# Patient Record
Sex: Female | Born: 1945 | Race: White | Hispanic: No | Marital: Married | Smoking: Never smoker
Health system: Southern US, Community
[De-identification: ages and names within clinical notes are randomized; demographics above are authoritative.]

## PROBLEM LIST (undated history)

## (undated) DIAGNOSIS — R269 Unspecified abnormalities of gait and mobility: Secondary | ICD-10-CM

## (undated) DIAGNOSIS — I471 Supraventricular tachycardia, unspecified: Secondary | ICD-10-CM

## (undated) DIAGNOSIS — A809 Acute poliomyelitis, unspecified: Secondary | ICD-10-CM

## (undated) DIAGNOSIS — E0590 Thyrotoxicosis, unspecified without thyrotoxic crisis or storm: Secondary | ICD-10-CM

## (undated) HISTORY — DX: Supraventricular tachycardia: I47.1

## (undated) HISTORY — PX: TONSILLECTOMY: SUR1361

## (undated) HISTORY — PX: BREAST SURGERY: SHX581

## (undated) HISTORY — DX: Supraventricular tachycardia, unspecified: I47.10

## (undated) HISTORY — DX: Unspecified abnormalities of gait and mobility: R26.9

---

## 1997-12-20 HISTORY — PX: BREAST BIOPSY: SHX20

## 2005-09-13 ENCOUNTER — Ambulatory Visit: Payer: Self-pay

## 2006-09-26 ENCOUNTER — Ambulatory Visit: Payer: Self-pay

## 2007-01-13 ENCOUNTER — Ambulatory Visit: Payer: Self-pay | Admitting: Unknown Physician Specialty

## 2007-09-29 ENCOUNTER — Ambulatory Visit: Payer: Self-pay

## 2007-10-28 ENCOUNTER — Emergency Department: Payer: Self-pay | Admitting: Emergency Medicine

## 2008-10-11 ENCOUNTER — Ambulatory Visit: Payer: Self-pay

## 2009-12-10 ENCOUNTER — Ambulatory Visit: Payer: Self-pay

## 2009-12-17 ENCOUNTER — Ambulatory Visit: Payer: Self-pay

## 2010-06-05 ENCOUNTER — Ambulatory Visit: Payer: Self-pay

## 2010-12-15 ENCOUNTER — Ambulatory Visit: Payer: Self-pay

## 2010-12-20 HISTORY — PX: BREAST BIOPSY: SHX20

## 2010-12-31 ENCOUNTER — Ambulatory Visit: Payer: Self-pay | Admitting: Surgery

## 2011-01-08 ENCOUNTER — Ambulatory Visit: Payer: Self-pay | Admitting: Surgery

## 2011-01-12 LAB — PATHOLOGY REPORT

## 2013-05-01 ENCOUNTER — Ambulatory Visit: Payer: Self-pay | Admitting: Internal Medicine

## 2015-07-04 ENCOUNTER — Ambulatory Visit
Admission: RE | Admit: 2015-07-04 | Discharge: 2015-07-04 | Disposition: A | Discharge status: Home / Self Care | Payer: Medicare Other | Source: Ambulatory Visit | Attending: Unknown Physician Specialty | Admitting: Unknown Physician Specialty

## 2015-07-04 ENCOUNTER — Ambulatory Visit: Payer: Medicare Other | Admitting: Anesthesiology

## 2015-07-04 ENCOUNTER — Encounter
Admission: RE | Disposition: A | Discharge status: Home / Self Care | Payer: Self-pay | Source: Ambulatory Visit | Attending: Unknown Physician Specialty

## 2015-07-04 ENCOUNTER — Encounter: Payer: Self-pay | Admitting: Anesthesiology

## 2015-07-04 DIAGNOSIS — D125 Benign neoplasm of sigmoid colon: Secondary | ICD-10-CM | POA: Insufficient documentation

## 2015-07-04 DIAGNOSIS — Z8371 Family history of colonic polyps: Secondary | ICD-10-CM | POA: Diagnosis not present

## 2015-07-04 DIAGNOSIS — K5730 Diverticulosis of large intestine without perforation or abscess without bleeding: Secondary | ICD-10-CM | POA: Diagnosis not present

## 2015-07-04 DIAGNOSIS — K640 First degree hemorrhoids: Secondary | ICD-10-CM | POA: Insufficient documentation

## 2015-07-04 DIAGNOSIS — Z8612 Personal history of poliomyelitis: Secondary | ICD-10-CM | POA: Insufficient documentation

## 2015-07-04 DIAGNOSIS — E0590 Thyrotoxicosis, unspecified without thyrotoxic crisis or storm: Secondary | ICD-10-CM | POA: Insufficient documentation

## 2015-07-04 DIAGNOSIS — Z79899 Other long term (current) drug therapy: Secondary | ICD-10-CM | POA: Diagnosis not present

## 2015-07-04 DIAGNOSIS — Z1211 Encounter for screening for malignant neoplasm of colon: Secondary | ICD-10-CM | POA: Insufficient documentation

## 2015-07-04 HISTORY — PX: COLONOSCOPY WITH PROPOFOL: SHX5780

## 2015-07-04 HISTORY — DX: Thyrotoxicosis, unspecified without thyrotoxic crisis or storm: E05.90

## 2015-07-04 HISTORY — DX: Acute poliomyelitis, unspecified: A80.9

## 2015-07-04 SURGERY — COLONOSCOPY WITH PROPOFOL
Anesthesia: General

## 2015-07-04 MED ORDER — SODIUM CHLORIDE 0.9 % IV SOLN
INTRAVENOUS | Status: DC
  Administered 2015-07-04: 12:00:00 via INTRAVENOUS
  Administered 2015-07-04: 1000 mL via INTRAVENOUS

## 2015-07-04 MED ORDER — PROPOFOL 10 MG/ML IV BOLUS
INTRAVENOUS | Status: DC | PRN
  Administered 2015-07-04: 70 mg via INTRAVENOUS

## 2015-07-04 MED ORDER — FENTANYL CITRATE (PF) 100 MCG/2ML IJ SOLN
INTRAMUSCULAR | Status: DC | PRN
  Administered 2015-07-04: 50 ug via INTRAVENOUS

## 2015-07-04 MED ORDER — MIDAZOLAM HCL 5 MG/5ML IJ SOLN
INTRAMUSCULAR | Status: DC | PRN
  Administered 2015-07-04: 2 mg via INTRAVENOUS

## 2015-07-04 MED ORDER — SODIUM CHLORIDE 0.9 % IV SOLN: INTRAVENOUS | Status: DC

## 2015-07-04 NOTE — Op Note (Signed)
[facility] Gastroenterology Patient Name: Priscilla Berry Procedure Date: 07/04/2015 11:50 AM MRN: [id number] Account #: [id number] Date of Birth: 23-Feb-1946 Admit Type: Outpatient Age: 69 Room: [facility] ENDO ROOM 1 Gender: Female Note Status: Finalized Procedure:         Colonoscopy Indications:       Colon cancer screening in patient at increased risk:                     Family history of colon polyps Providers:         Manya Silvas, MD Referring MD:      Kimberlee Nearing. Alvin Critchley, MD (Referring MD) Medicines:         Propofol per Anesthesia Complications:     No immediate complications. Procedure:         Pre-Anesthesia Assessment:                    - After reviewing the risks and benefits, the patient was                     deemed in satisfactory condition to undergo the procedure.                    After obtaining informed consent, the colonoscope was                     passed under direct vision. Throughout the procedure, the                     patient's blood pressure, pulse, and oxygen saturations                     were monitored continuously. The Colonoscope was                     introduced through the anus and advanced to the the cecum,                     identified by appendiceal orifice and ileocecal valve. The                     colonoscopy was somewhat difficult due to a tortuous                     colon. Successful completion of the procedure was aided by                     applying abdominal pressure. The patient tolerated the                     procedure well. The quality of the bowel preparation was                     good. Findings:      Five sessile polyps were found in the sigmoid colon. The polyps were       diminutive in size. These polyps were removed with a jumbo cold forceps.       Resection and retrieval were complete.      A few small-mouthed diverticula were found in the sigmoid colon and in       the descending colon.   Internal hemorrhoids were found during endoscopy. The hemorrhoids were       small and Grade I (internal hemorrhoids that do not  prolapse).      The exam was otherwise without abnormality. Impression:        - Five diminutive polyps in the sigmoid colon. Resected                     and retrieved.                    - Diverticulosis in the sigmoid colon and in the                     descending colon.                    - Internal hemorrhoids.                    - The examination was otherwise normal. Recommendation:    - Await pathology results. Manya Silvas, MD 07/04/2015 12:40:26 PM This report has been signed electronically. Number of Addenda: 0 Note Initiated On: 07/04/2015 11:50 AM Scope Withdrawal Time: 0 hours 15 minutes 29 seconds  Total Procedure Duration: 0 hours 27 minutes 22 seconds       [facility]

## 2015-07-04 NOTE — Anesthesia Procedure Notes (Signed)
Date/Time: 07/04/2015 12:10 PM Performed by: Allean Found Pre-anesthesia Checklist: Patient identified, Emergency Drugs available, Suction available, Patient being monitored and Timeout performed Oxygen Delivery Method: Nasal cannula

## 2015-07-04 NOTE — Anesthesia Preprocedure Evaluation (Addendum)
Anesthesia Evaluation  Patient identified by MRN, date of birth, ID band Patient awake    Reviewed: Allergy & Precautions, NPO status , Patient's Chart, lab work & pertinent test results, reviewed documented beta blocker date and time   Airway Mallampati: II  TM Distance: >3 FB     Dental  (+) Chipped   Pulmonary          Cardiovascular     Neuro/Psych    GI/Hepatic   Endo/Other  Hypothyroidism   Renal/GU      Musculoskeletal   Abdominal   Peds  Hematology   Anesthesia Other Findings   Reproductive/Obstetrics                             Anesthesia Physical Anesthesia Plan  ASA: II  Anesthesia Plan: General   Post-op Pain Management:    Induction: Intravenous  Airway Management Planned: Nasal Cannula  Additional Equipment:   Intra-op Plan:   Post-operative Plan:   Informed Consent: I have reviewed the patients History and Physical, chart, labs and discussed the procedure including the risks, benefits and alternatives for the proposed anesthesia with the patient or authorized representative who has indicated his/her understanding and acceptance.     Plan Discussed with: CRNA  Anesthesia Plan Comments:         Anesthesia Quick Evaluation

## 2015-07-04 NOTE — Anesthesia Postprocedure Evaluation (Signed)
  Anesthesia Post-op Note  Patient: Priscilla Berry  Procedure(s) Performed: Procedure(s): COLONOSCOPY WITH PROPOFOL (N/A)  Anesthesia type:General  Patient location: PACU  Post pain: Pain level controlled  Post assessment: Post-op Vital signs reviewed, Patient's Cardiovascular Status Stable, Respiratory Function Stable, Patent Airway and No signs of Nausea or vomiting  Post vital signs: Reviewed and stable  Last Vitals:  Filed Vitals:   07/04/15 1610  BP: 136/71  Pulse:   Temp:   Resp:     Level of consciousness: awake, alert  and patient cooperative  Complications: No apparent anesthesia complications

## 2015-07-04 NOTE — H&P (Signed)
Primary Care Physician:  Idelle Crouch, MD Primary Gastroenterologist:  Dr. Vira Agar  Pre-Procedure History & Physical: HPI:  Priscilla Berry is a 69 y.o. female is here for an colonoscopy.   Past Medical History  Diagnosis Date  . Hyperthyroidism   . Polio     Past Surgical History  Procedure Laterality Date  . Breast surgery      breast biopsies  . Tonsillectomy      Prior to Admission medications   Medication Sig Start Date End Date Taking? Authorizing Provider  Calcium Carbonate-Vitamin D (CALCIUM-VITAMIN D) 500-200 MG-UNIT per tablet Take 1 tablet by mouth 3 (three) times a week.   Yes Historical Provider, MD  Fish Oil-Cholecalciferol (FISH OIL + D3) 1000-1000 MG-UNIT CAPS Take by mouth 1 day or 1 dose.   Yes Historical Provider, MD  folic acid (FOLVITE) 1 MG tablet Take 1 mg by mouth daily.   Yes Historical Provider, MD  iodine-sodium iodide 2-2.4 % solution Apply topically 3 (three) times a week.   Yes Historical Provider, MD  magnesium oxide (MAG-OX) 400 MG tablet Take 400 mg by mouth daily.   Yes Historical Provider, MD  progesterone (PROMETRIUM) 100 MG capsule Take 100 mg by mouth daily.   Yes Historical Provider, MD  pyridoxine (B-6) 100 MG tablet Take 100 mg by mouth daily.   Yes Historical Provider, MD  thyroid (ARMOUR) 65 MG tablet Take 65 mg by mouth daily.   Yes Historical Provider, MD  vitamin B-12 (CYANOCOBALAMIN) 100 MCG tablet Take 100 mcg by mouth daily.   Yes Historical Provider, MD    Allergies as of 05/20/2015  . (Not on File)    History reviewed. No pertinent family history.  History   Social History  . Marital Status: Married    Spouse Name: N/A  . Number of Children: N/A  . Years of Education: N/A   Occupational History  . Not on file.   Social History Main Topics  . Smoking status: Never Smoker   . Smokeless tobacco: Not on file  . Alcohol Use: Not on file  . Drug Use: Not on file  . Sexual Activity: Not on file   Other  Topics Concern  . Not on file   Social History Narrative  . No narrative on file    Review of Systems: See HPI, otherwise negative ROS  Physical Exam: BP 131/75 mmHg  Pulse 88  Temp(Src) 96.5 F (35.8 C) (Tympanic)  Resp 22  Ht 5\' 7"  (1.702 m)  Wt 58.968 kg (130 lb)  BMI 20.36 kg/m2  SpO2 98% General:   Alert,  pleasant and cooperative in NAD Head:  Normocephalic and atraumatic. Neck:  Supple; no masses or thyromegaly. Lungs:  Clear throughout to auscultation.    Heart:  Regular rate and rhythm. Abdomen:  Soft, nontender and nondistended. Normal bowel sounds, without guarding, and without rebound.   Neurologic:  Alert and  oriented x4;  grossly normal neurologically.  Impression/Plan: Priscilla Berry is here for an colonoscopy to be performed for family hx of colon polyps in mother.  Risks, benefits, limitations, and alternatives regarding  colonoscopy have been reviewed with the patient.  Questions have been answered.  All parties agreeable.   Gaylyn Cheers, MD  07/04/2015, 12:04 PM   Primary Care Physician:  Idelle Crouch, MD Primary Gastroenterologist:  Dr. Vira Agar  Pre-Procedure History & Physical: HPI:  Priscilla Berry is a 69 y.o. female is here for an colonoscopy.   Past Medical  History  Diagnosis Date  . Hyperthyroidism   . Polio     Past Surgical History  Procedure Laterality Date  . Breast surgery      breast biopsies  . Tonsillectomy      Prior to Admission medications   Medication Sig Start Date End Date Taking? Authorizing Provider  Calcium Carbonate-Vitamin D (CALCIUM-VITAMIN D) 500-200 MG-UNIT per tablet Take 1 tablet by mouth 3 (three) times a week.   Yes Historical Provider, MD  Fish Oil-Cholecalciferol (FISH OIL + D3) 1000-1000 MG-UNIT CAPS Take by mouth 1 day or 1 dose.   Yes Historical Provider, MD  folic acid (FOLVITE) 1 MG tablet Take 1 mg by mouth daily.   Yes Historical Provider, MD  iodine-sodium iodide 2-2.4 % solution  Apply topically 3 (three) times a week.   Yes Historical Provider, MD  magnesium oxide (MAG-OX) 400 MG tablet Take 400 mg by mouth daily.   Yes Historical Provider, MD  progesterone (PROMETRIUM) 100 MG capsule Take 100 mg by mouth daily.   Yes Historical Provider, MD  pyridoxine (B-6) 100 MG tablet Take 100 mg by mouth daily.   Yes Historical Provider, MD  thyroid (ARMOUR) 65 MG tablet Take 65 mg by mouth daily.   Yes Historical Provider, MD  vitamin B-12 (CYANOCOBALAMIN) 100 MCG tablet Take 100 mcg by mouth daily.   Yes Historical Provider, MD    Allergies as of 05/20/2015  . (Not on File)    History reviewed. No pertinent family history.  History   Social History  . Marital Status: Married    Spouse Name: N/A  . Number of Children: N/A  . Years of Education: N/A   Occupational History  . Not on file.   Social History Main Topics  . Smoking status: Never Smoker   . Smokeless tobacco: Not on file  . Alcohol Use: Not on file  . Drug Use: Not on file  . Sexual Activity: Not on file   Other Topics Concern  . Not on file   Social History Narrative  . No narrative on file    Review of Systems: See HPI, otherwise negative ROS  Physical Exam: BP 131/75 mmHg  Pulse 88  Temp(Src) 96.5 F (35.8 C) (Tympanic)  Resp 22  Ht 5\' 7"  (1.702 m)  Wt 58.968 kg (130 lb)  BMI 20.36 kg/m2  SpO2 98% General:   Alert,  pleasant and cooperative in NAD Head:  Normocephalic and atraumatic. Neck:  Supple; no masses or thyromegaly. Lungs:  Clear throughout to auscultation.    Heart:  Regular rate and rhythm. Abdomen:  Soft, nontender and nondistended. Normal bowel sounds, without guarding, and without rebound.   Neurologic:  Alert and  oriented x4;  grossly normal neurologically.  Impression/Plan: Priscilla Berry is here for an colonoscopy to be performed for family history colon polyps in mother.  Risks, benefits, limitations, and alternatives regarding  colonoscopy have been  reviewed with the patient.  Questions have been answered.  All parties agreeable.   Gaylyn Cheers, MD  07/04/2015, 12:04 PM

## 2015-07-04 NOTE — Transfer of Care (Signed)
Immediate Anesthesia Transfer of Care Note  Patient: Priscilla Berry  Procedure(s) Performed: Procedure(s): COLONOSCOPY WITH PROPOFOL (N/A)  Patient Location: PACU  Anesthesia Type:General  Level of Consciousness: sedated  Airway & Oxygen Therapy: Patient Spontanous Breathing and Patient connected to nasal cannula oxygen  Post-op Assessment: Report given to RN and Post -op Vital signs reviewed and stable  Post vital signs: Reviewed and stable  Last Vitals:  Filed Vitals:   07/04/15 0954  BP: 131/75  Pulse: 88  Temp: 35.8 C  Resp: 22    Complications: No apparent anesthesia complications

## 2015-07-07 LAB — SURGICAL PATHOLOGY

## 2015-07-14 ENCOUNTER — Encounter: Payer: Self-pay | Admitting: Unknown Physician Specialty

## 2015-10-06 DIAGNOSIS — E039 Hypothyroidism, unspecified: Secondary | ICD-10-CM | POA: Insufficient documentation

## 2015-10-06 DIAGNOSIS — G14 Postpolio syndrome: Secondary | ICD-10-CM | POA: Insufficient documentation

## 2015-10-22 ENCOUNTER — Inpatient Hospital Stay: Payer: Medicare Other

## 2015-10-22 ENCOUNTER — Inpatient Hospital Stay: Payer: Medicare Other | Attending: Oncology | Admitting: Oncology

## 2015-10-22 ENCOUNTER — Other Ambulatory Visit: Payer: Self-pay | Admitting: Internal Medicine

## 2015-10-22 VITALS — BP 124/74 | HR 85 | Temp 97.4°F | Resp 16 | Wt 136.2 lb

## 2015-10-22 DIAGNOSIS — Z8612 Personal history of poliomyelitis: Secondary | ICD-10-CM

## 2015-10-22 DIAGNOSIS — D751 Secondary polycythemia: Secondary | ICD-10-CM | POA: Diagnosis not present

## 2015-10-22 DIAGNOSIS — Z1231 Encounter for screening mammogram for malignant neoplasm of breast: Secondary | ICD-10-CM | POA: Diagnosis not present

## 2015-10-22 DIAGNOSIS — E039 Hypothyroidism, unspecified: Secondary | ICD-10-CM | POA: Insufficient documentation

## 2015-10-22 DIAGNOSIS — Z148 Genetic carrier of other disease: Secondary | ICD-10-CM | POA: Insufficient documentation

## 2015-10-22 DIAGNOSIS — Z79899 Other long term (current) drug therapy: Secondary | ICD-10-CM

## 2015-10-22 DIAGNOSIS — D509 Iron deficiency anemia, unspecified: Secondary | ICD-10-CM | POA: Diagnosis not present

## 2015-10-22 DIAGNOSIS — D45 Polycythemia vera: Secondary | ICD-10-CM | POA: Diagnosis not present

## 2015-10-22 DIAGNOSIS — D72829 Elevated white blood cell count, unspecified: Secondary | ICD-10-CM | POA: Diagnosis not present

## 2015-10-22 LAB — LACTATE DEHYDROGENASE: LDH: 578 U/L — ABNORMAL HIGH (ref 98–192)

## 2015-10-22 LAB — CARBOXYHEMOGLOBIN

## 2015-10-22 LAB — CBC
HEMATOCRIT: 59.1 % — AB (ref 35.0–47.0)
Hemoglobin: 18 g/dL — ABNORMAL HIGH (ref 12.0–16.0)
MCH: 22 pg — ABNORMAL LOW (ref 26.0–34.0)
MCHC: 30.4 g/dL — AB (ref 32.0–36.0)
MCV: 72.4 fL — ABNORMAL LOW (ref 80.0–100.0)
Platelets: 247 10*3/uL (ref 150–440)
RBC: 8.16 MIL/uL — ABNORMAL HIGH (ref 3.80–5.20)
RDW: 29.3 % — AB (ref 11.5–14.5)
WBC: 17.6 10*3/uL — ABNORMAL HIGH (ref 3.6–11.0)

## 2015-10-22 LAB — IRON AND TIBC
Iron: 27 ug/dL — ABNORMAL LOW (ref 28–170)
Saturation Ratios: 6 % — ABNORMAL LOW (ref 10.4–31.8)
TIBC: 420 ug/dL (ref 250–450)
UIBC: 393 ug/dL

## 2015-10-22 LAB — FERRITIN: Ferritin: 15 ng/mL (ref 11–307)

## 2015-10-22 LAB — FOLATE: Folate: 55 ng/mL (ref >5.9)

## 2015-10-22 LAB — VITAMIN B12: VITAMIN B 12: 1804 pg/mL — AB (ref 180–914)

## 2015-10-23 LAB — CARBON MONOXIDE, BLOOD (PERFORMED AT REF LAB): CARBON MONOXIDE, BLOOD: 2.8 % — AB (ref 0.0–1.9)

## 2015-10-29 LAB — COMP PANEL: LEUKEMIA/LYMPHOMA

## 2015-10-29 LAB — BCR-ABL1 KINASE DOMAIN MUTATION ANALYSIS

## 2015-10-29 LAB — HEMOCHROMATOSIS DNA-PCR(C282Y,H63D)

## 2015-10-30 NOTE — Progress Notes (Signed)
[place]  Telephone:(336) [phone] [phone]) [phone]  ID: Priscilla Berry OB: 11/09/1946  MR#: [id number]  RWE#:[id number]  Patient Care Team: Idelle Crouch, MD as PCP - General (Internal Medicine)  CHIEF COMPLAINT:  Chief Complaint  Patient presents with  . New Evaluation    hematology    INTERVAL HISTORY: Patient is a 69 year old female who was found to have an elevated white blood cell count and red blood cell count on routine blood work. She currently feels well and is asymptomatic. She has no neurologic complaint. She denies any fevers, night sweats, or weight loss. She denies any recent illnesses or new medications. She has no chest pain or shortness of breath. She denies any nausea, vomiting, constipation, or diarrhea. She has no urinary complaints. Patient feels at her baseline and offers no specific complaints today.  REVIEW OF SYSTEMS:   Review of Systems  Constitutional: Negative for fever, weight loss and malaise/fatigue.  Respiratory: Negative.   Cardiovascular: Negative.   Gastrointestinal: Negative.   Genitourinary: Negative.   Musculoskeletal: Negative.   Neurological: Negative.  Negative for weakness.  Endo/Heme/Allergies: Does not bruise/bleed easily.    As per HPI. Otherwise, a complete review of systems is negatve.  PAST MEDICAL HISTORY: Past Medical History  Diagnosis Date  . Hyperthyroidism   . Polio     PAST SURGICAL HISTORY: Past Surgical History  Procedure Laterality Date  . Breast surgery      breast biopsies  . Tonsillectomy    . Colonoscopy with propofol N/A 07/04/2015    Procedure: COLONOSCOPY WITH PROPOFOL;  Surgeon: Manya Silvas, MD;  Location: [facility] ENDOSCOPY;  Service: Endoscopy;  Laterality: N/A;    FAMILY HISTORY: Reviewed and unchanged. No reported history of malignancy or chronic disease.     ADVANCED DIRECTIVES:    HEALTH MAINTENANCE: Social History  Substance Use Topics  . Smoking status:  Never Smoker   . Smokeless tobacco: Not on file  . Alcohol Use: Not on file     Colonoscopy:  PAP:  Bone density:  Lipid panel:  Allergies  Allergen Reactions  . Caffeine     Current Outpatient Prescriptions  Medication Sig Dispense Refill  . ARMOUR THYROID 60 MG tablet TABLETS BY MOUTH TAKE ONE TABLET BY MOUTH ONE TIME DAILY  4  . ASHWAGANDHA PO Take 1 tablet by mouth.    . calcium carbonate (OSCAL) 1500 (600 CA) MG TABS tablet Take 1,200 mg by mouth 2 (two) times daily with a meal.    . Calcium Carbonate-Vitamin D (CALCIUM-VITAMIN D) 500-200 MG-UNIT per tablet Take 1 tablet by mouth 3 (three) times a week.    . Cyanocobalamin (RA VITAMIN B-12 TR) 1000 MCG TBCR Take by mouth.    Marland Kitchen DHEA 25 MG CAPS Take 1 capsule by mouth 2 (two) times a week.    . Fish Oil-Cholecalciferol (FISH OIL + D3) 1000-1000 MG-UNIT CAPS Take by mouth 1 day or 1 dose.    . folic acid (FOLVITE) 619 MCG tablet Take by mouth.    Marland Kitchen MAGNESIUM CITRATE PO Take 200 mg by mouth daily.    . Probiotic Product (PROBIOTIC DAILY PO) Take 1 tablet by mouth daily.    . progesterone (PROMETRIUM) 100 MG capsule Take 100 mg by mouth daily.     No current facility-administered medications for this visit.    OBJECTIVE: Filed Vitals:   10/22/15 1155  BP: 124/74  Pulse: 85  Temp: 97.4 F (36.3 C)  Resp: 16  Body mass index is 21.33 kg/(m^2).    ECOG FS:0 - Asymptomatic  General: Well-developed, well-nourished, no acute distress. Eyes: Pink conjunctiva, anicteric sclera. HEENT: Normocephalic, moist mucous membranes, clear oropharnyx. Lungs: Clear to auscultation bilaterally. Heart: Regular rate and rhythm. No rubs, murmurs, or gallops. Abdomen: Soft, nontender, nondistended. No organomegaly noted, normoactive bowel sounds. Musculoskeletal: No edema, cyanosis, or clubbing. Neuro: Alert, answering all questions appropriately. Cranial nerves grossly intact. Skin: No rashes or petechiae noted. Psych: Normal  affect. Lymphatics: No cervical, calvicular, axillary or inguinal LAD.   LAB RESULTS:  No results found for: NA, K, CL, CO2, GLUCOSE, BUN, CREATININE, CALCIUM, PROT, ALBUMIN, AST, ALT, ALKPHOS, BILITOT, GFRNONAA, GFRAA  Lab Results  Component Value Date   WBC 17.6* 10/22/2015   HGB 18.0* 10/22/2015   HCT 59.1* 10/22/2015   MCV 72.4* 10/22/2015   PLT 247 10/22/2015     STUDIES: No results found.  ASSESSMENT: Leukocytosis, polycythemia.  PLAN:    1. Leukocytosis: Unclear etiology. BCR/ABL was negative for underlying CML. Her peripheral blood flow cytometry did reveal approximately 1% myeloblasts which is considered abnormal possibly suggesting an underlying myeloproliferative disorder in her bone marrow. Patient will likely need a bone marrow biopsy in the future for further evaluation. 2. Polycythemia: Patient has a mildly elevated carboxyhemoglobin but this is likely not contributing. 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 She does not require phlebotomy giving her significant iron deficiency. It is possible patient has a combination of polycythemia vera as well as iron deficiency. JAK-2 mutation is pending at time of dictation. Ultimately, patient may require bone marrow biopsy as stated above. 3. Disposition: Return to clinic in 2 weeks to discuss her laboratory results and further diagnostic evaluation.  Patient expressed understanding and was in agreement with this plan. She also understands that She can call clinic at any time with any questions, concerns, or complaints.   Lloyd Huger, MD   10/30/2015 10:25 AM

## 2015-10-31 LAB — JAK2 GENOTYPR

## 2015-10-31 LAB — ERYTHROPOIETIN: ERYTHROPOIETIN: 1.1 m[IU]/mL — AB (ref 2.6–18.5)

## 2015-11-05 ENCOUNTER — Inpatient Hospital Stay (HOSPITAL_BASED_OUTPATIENT_CLINIC_OR_DEPARTMENT_OTHER): Payer: Medicare Other | Admitting: Oncology

## 2015-11-05 ENCOUNTER — Ambulatory Visit
Admission: RE | Admit: 2015-11-05 | Discharge: 2015-11-05 | Disposition: A | Discharge status: Home / Self Care | Payer: Medicare Other | Source: Ambulatory Visit | Attending: Internal Medicine | Admitting: Internal Medicine

## 2015-11-05 ENCOUNTER — Other Ambulatory Visit: Payer: Self-pay | Admitting: Internal Medicine

## 2015-11-05 VITALS — BP 134/80 | HR 80 | Temp 96.5°F | Resp 16 | Wt 138.7 lb

## 2015-11-05 DIAGNOSIS — D509 Iron deficiency anemia, unspecified: Secondary | ICD-10-CM

## 2015-11-05 DIAGNOSIS — Z8612 Personal history of poliomyelitis: Secondary | ICD-10-CM

## 2015-11-05 DIAGNOSIS — Z148 Genetic carrier of other disease: Secondary | ICD-10-CM

## 2015-11-05 DIAGNOSIS — Z79899 Other long term (current) drug therapy: Secondary | ICD-10-CM | POA: Diagnosis not present

## 2015-11-05 DIAGNOSIS — Z1231 Encounter for screening mammogram for malignant neoplasm of breast: Secondary | ICD-10-CM | POA: Diagnosis present

## 2015-11-05 DIAGNOSIS — D45 Polycythemia vera: Secondary | ICD-10-CM | POA: Diagnosis not present

## 2015-11-05 DIAGNOSIS — E039 Hypothyroidism, unspecified: Secondary | ICD-10-CM | POA: Diagnosis not present

## 2015-11-05 MED ORDER — HYDROXYUREA 500 MG PO CAPS: 500.0000 mg | ORAL_CAPSULE | Freq: Every day | ORAL | Status: DC

## 2015-11-05 NOTE — Progress Notes (Signed)
Priscilla Berry  Telephone:(336) [phone] Fax:(336) [phone]  ID: Priscilla Berry OB: [birth year]  MR#: [id number]  [id number]#:[id number]  Patient Care Team: Idelle Crouch, MD as PCP - General (Internal Medicine)  CHIEF COMPLAINT:  Chief Complaint  Patient presents with  . polycythemia    discuss test results    INTERVAL HISTORY: Patient returns to clinic today for further evaluation and discussion of her laboratory work. She continues to feel well and is asymptomatic. She has no neurologic complaints. She denies any fevers, night sweats, or weight loss. She has no chest pain or shortness of breath. She denies any nausea, vomiting, constipation, or diarrhea. She has no urinary complaints. Patient feels at her baseline and offers no specific complaints today.  REVIEW OF SYSTEMS:   Review of Systems  Constitutional: Negative for fever, weight loss and malaise/fatigue.  Respiratory: Negative.   Cardiovascular: Negative.   Gastrointestinal: Negative.   Genitourinary: Negative.   Musculoskeletal: Negative.   Neurological: Negative.  Negative for weakness.  Endo/Heme/Allergies: Does not bruise/bleed easily.    As per HPI. Otherwise, a complete review of systems is negatve.  PAST MEDICAL HISTORY: Past Medical History  Diagnosis Date  . Hyperthyroidism   . Polio     PAST SURGICAL HISTORY: Past Surgical History  Procedure Laterality Date  . Breast surgery      breast biopsies  . Tonsillectomy    . Colonoscopy with propofol N/A 07/04/2015    Procedure: COLONOSCOPY WITH PROPOFOL;  Surgeon: Manya Silvas, MD;  Location: [facility] ENDOSCOPY;  Service: Endoscopy;  Laterality: N/A;  . Breast biopsy Right 1999    EXCISIONAL - NEG  . Breast biopsy Right 2012    EXCISIONAL - NEG    FAMILY HISTORY: Reviewed and unchanged. No reported history of malignancy or chronic disease.     ADVANCED DIRECTIVES:    HEALTH MAINTENANCE: Social History  Substance Use Topics  .  Smoking status: Never Smoker   . Smokeless tobacco: Not on file  . Alcohol Use: Not on file     Colonoscopy:  PAP:  Bone density:  Lipid panel:  Allergies  Allergen Reactions  . Caffeine     Current Outpatient Prescriptions  Medication Sig Dispense Refill  . ARMOUR THYROID 60 MG tablet TABLETS BY MOUTH TAKE ONE TABLET BY MOUTH ONE TIME DAILY  4  . ASHWAGANDHA PO Take 1 tablet by mouth.    . calcium carbonate (OSCAL) 1500 (600 CA) MG TABS tablet Take 1,200 mg by mouth 2 (two) times daily with a meal.    . Calcium Carbonate-Vitamin D (CALCIUM-VITAMIN D) 500-200 MG-UNIT per tablet Take 1 tablet by mouth 3 (three) times a week.    . Cyanocobalamin (RA VITAMIN B-12 TR) 1000 MCG TBCR Take by mouth.    Marland Kitchen DHEA 25 MG CAPS Take 1 capsule by mouth 2 (two) times a week.    . Fish Oil-Cholecalciferol (FISH OIL + D3) 1000-1000 MG-UNIT CAPS Take by mouth 1 day or 1 dose.    . folic acid (FOLVITE) 403 MCG tablet Take by mouth.    Marland Kitchen MAGNESIUM CITRATE PO Take 200 mg by mouth daily.    . Probiotic Product (PROBIOTIC DAILY PO) Take 1 tablet by mouth daily.    . progesterone (PROMETRIUM) 100 MG capsule Take 100 mg by mouth daily.    . hydroxyurea (HYDREA) 500 MG capsule Take 1 capsule (500 mg total) by mouth daily. May take with food to minimize GI side effects. 30 capsule 2  No current facility-administered medications for this visit.    OBJECTIVE: Filed Vitals:   11/05/15 1139  BP: 134/80  Pulse: 80  Temp: 96.5 F (35.8 C)  Resp: 16     Body mass index is 21.71 kg/(m^2).    ECOG FS:0 - Asymptomatic  General: Well-developed, well-nourished, no acute distress. Eyes: Pink conjunctiva, anicteric sclera. Lungs: Clear to auscultation bilaterally. Heart: Regular rate and rhythm. No rubs, murmurs, or gallops. Abdomen: Soft, nontender, nondistended. No organomegaly noted, normoactive bowel sounds. Musculoskeletal: No edema, cyanosis, or clubbing. Neuro: Alert, answering all questions  appropriately. Cranial nerves grossly intact. Skin: No rashes or petechiae noted. Psych: Normal affect.    LAB RESULTS:  No results found for: NA, K, CL, CO2, GLUCOSE, BUN, CREATININE, CALCIUM, PROT, ALBUMIN, AST, ALT, ALKPHOS, BILITOT, GFRNONAA, GFRAA  Lab Results  Component Value Date   WBC 17.6* 10/22/2015   HGB 18.0* 10/22/2015   HCT 59.1* 10/22/2015   MCV 72.4* 10/22/2015   PLT 247 10/22/2015     STUDIES: Mm Screening Breast Tomo Bilateral  11/05/2015  CLINICAL DATA:  Screening. EXAM: DIGITAL SCREENING BILATERAL MAMMOGRAM WITH 3D TOMO WITH CAD COMPARISON:  Previous exam(s). ACR Breast Density Category c: The breast tissue is heterogeneously dense, which may obscure small masses. FINDINGS: There are no findings suspicious for malignancy. Images were processed with CAD. IMPRESSION: No mammographic evidence of malignancy. A result letter of this screening mammogram will be mailed directly to the patient. RECOMMENDATION: Screening mammogram in one year. (Code:SM-B-01Y) BI-RADS CATEGORY  1: Negative. Electronically Signed   By: Lovey Newcomer M.D.   On: 11/05/2015 11:21    ASSESSMENT: JAK-2 positive, polycythemia vera, iron deficiency, heterozygous for hemochromatosis gene mutation [id number]   PLAN:    1. Polycythemia vera: JAK-2 mutation is positive. She does not require phlebotomy given her significant iron deficiency, but she will benefit from 521m Hydrea daily.  Patient has a combination of polycythemia vera as well as iron deficiency. Return to clinic in 6 weeks for repeat laboratory work and further evaluation. 2. Leukocytosis: Likely related to P. vera.  BCR/ABL was negative for underlying CML. Her peripheral blood flow cytometry did reveal approximately 1% myeloblasts.  Monitor.  Patient may need a bone marrow biopsy in the future for further evaluation., 3. Hemochromatosis: Patient is likely an unaffected carrier and is iron deficient, likely from her P. vera. No phlebotomy is  needed.  Approximately 30 minutes was spent in discussion of which greater than 50% was consultation.   Patient expressed understanding and was in agreement with this plan. She also understands that She can call clinic at any time with any questions, concerns, or complaints.   TLloyd Huger MD   11/05/2015 11:41 PM

## 2015-11-09 ENCOUNTER — Emergency Department
Admission: EM | Admit: 2015-11-09 | Discharge: 2015-11-09 | Disposition: A | Discharge status: Home / Self Care | Payer: Medicare Other | Attending: Emergency Medicine | Admitting: Emergency Medicine

## 2015-11-09 ENCOUNTER — Encounter: Payer: Self-pay | Admitting: Emergency Medicine

## 2015-11-09 ENCOUNTER — Emergency Department: Payer: Medicare Other

## 2015-11-09 DIAGNOSIS — R002 Palpitations: Secondary | ICD-10-CM | POA: Diagnosis present

## 2015-11-09 DIAGNOSIS — Z79899 Other long term (current) drug therapy: Secondary | ICD-10-CM | POA: Diagnosis not present

## 2015-11-09 DIAGNOSIS — I471 Supraventricular tachycardia: Secondary | ICD-10-CM | POA: Diagnosis not present

## 2015-11-09 DIAGNOSIS — Z79890 Hormone replacement therapy (postmenopausal): Secondary | ICD-10-CM | POA: Diagnosis not present

## 2015-11-09 LAB — TROPONIN I
TROPONIN I: 0.14 ng/mL — AB (ref <0.031)
Troponin I: 0.03 ng/mL (ref <0.031)

## 2015-11-09 LAB — BASIC METABOLIC PANEL
Anion gap: 15 (ref 5–15)
BUN: 15 mg/dL (ref 6–20)
CALCIUM: 9.2 mg/dL (ref 8.9–10.3)
CHLORIDE: 103 mmol/L (ref 101–111)
CO2: 19 mmol/L — AB (ref 22–32)
CREATININE: 0.79 mg/dL (ref 0.44–1.00)
GFR calc Af Amer: >60 mL/min (ref >60)
GFR calc non Af Amer: >60 mL/min (ref >60)
GLUCOSE: 129 mg/dL — AB (ref 65–99)
Potassium: 4.4 mmol/L (ref 3.5–5.1)
Sodium: 137 mmol/L (ref 135–145)

## 2015-11-09 LAB — CBC
HCT: 62.1 % — ABNORMAL HIGH (ref 35.0–47.0)
Hemoglobin: 18.8 g/dL — ABNORMAL HIGH (ref 12.0–16.0)
MCH: 21.9 pg — AB (ref 26.0–34.0)
MCHC: 30.3 g/dL — AB (ref 32.0–36.0)
MCV: 72.1 fL — AB (ref 80.0–100.0)
PLATELETS: 297 10*3/uL (ref 150–440)
RBC: >8.23 MIL/uL — ABNORMAL HIGH (ref 3.80–5.20)
RDW: 28.2 % — AB (ref 11.5–14.5)
WBC: 24.8 10*3/uL — ABNORMAL HIGH (ref 3.6–11.0)

## 2015-11-09 MED ORDER — ADENOSINE 6 MG/2ML IV SOLN
INTRAVENOUS | Status: AC
  Administered 2015-11-09: 6 mg via INTRAVENOUS
  Filled 2015-11-09: qty 4

## 2015-11-09 MED ORDER — ADENOSINE 12 MG/4ML IV SOLN
INTRAVENOUS | Status: AC
  Filled 2015-11-09: qty 4

## 2015-11-09 MED ORDER — SODIUM CHLORIDE 0.9 % IV BOLUS (SEPSIS)
1000.0000 mL | Freq: Once | INTRAVENOUS | Status: AC
  Administered 2015-11-09: 1000 mL via INTRAVENOUS

## 2015-11-09 MED ORDER — ADENOSINE 12 MG/4ML IV SOLN
12.0000 mg | Freq: Once | INTRAVENOUS | Status: AC
  Administered 2015-11-09: 12 mg via INTRAVENOUS

## 2015-11-09 MED ORDER — ADENOSINE 6 MG/2ML IV SOLN
6.0000 mg | Freq: Once | INTRAVENOUS | Status: AC
  Administered 2015-11-09: 6 mg via INTRAVENOUS

## 2015-11-09 MED ORDER — DILTIAZEM HCL 25 MG/5ML IV SOLN
INTRAVENOUS | Status: AC
  Administered 2015-11-09: 25 mg
  Filled 2015-11-09: qty 5

## 2015-11-09 MED ORDER — METOPROLOL SUCCINATE ER 50 MG PO TB24
50.0000 mg | ORAL_TABLET | Freq: Every day | ORAL | Status: DC
  Administered 2015-11-09: 50 mg via ORAL
  Filled 2015-11-09: qty 1

## 2015-11-09 MED ORDER — METOPROLOL SUCCINATE ER 50 MG PO TB24: 50.0000 mg | ORAL_TABLET | Freq: Every day | ORAL | Status: DC

## 2015-11-09 NOTE — ED Provider Notes (Signed)
[facility] Emergency Department Provider Note   ____________________________________________  Time seen: Upon arrival to ED room I have reviewed the triage vital signs and the triage nursing note.  HISTORY  Chief Complaint Palpitations   Historian Patient  HPI Priscilla Berry is a 69 y.o. female with a history of intermittent heart palpitations/racing, which has not been documented although she has been referred for echocardiogram and seen by her primary care physician for these complaints. This morning she experienced onset of heart racing associated with fullness in her neck about 1 hour prior to arrival. In the past that episode has gone away on its own after she takes deep breaths. Upon arrival to the triage room she was initially noted to be tachycardic, and then her heart rate was taken at 70. Upon movement back to her ED room, she did go back into a heart rate of around 210. She is not currently on any medications for heart or blood pressure. She does not take caffeine, or other stimulants.    Past Medical History  Diagnosis Date  . Hyperthyroidism   . Polio     Patient Active Problem List   Diagnosis Date Noted  . Acquired hypothyroidism 10/06/2015  . 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 Breast surgery      breast biopsies  . Tonsillectomy    . Colonoscopy with propofol N/A 07/04/2015    Procedure: COLONOSCOPY WITH PROPOFOL;  Surgeon: Manya Silvas, MD;  Location: [facility] ENDOSCOPY;  Service: Endoscopy;  Laterality: N/A;  . Breast biopsy Right 1999    EXCISIONAL - NEG  . Breast biopsy Right 2012    EXCISIONAL - NEG    Current Outpatient Rx  Name  Route  Sig  Dispense  Refill  . ARMOUR THYROID 60 MG tablet      TABLETS BY MOUTH TAKE ONE TABLET BY MOUTH ONE TIME DAILY      4     Dispense as written.   . ASHWAGANDHA PO   Oral   Take 1 tablet by mouth.         . calcium  carbonate (OSCAL) 1500 (600 CA) MG TABS tablet   Oral   Take 1,200 mg by mouth 2 (two) times daily with a meal.         . Calcium Carbonate-Vitamin D (CALCIUM-VITAMIN D) 500-200 MG-UNIT per tablet   Oral   Take 1 tablet by mouth 3 (three) times a week.         . Cyanocobalamin (RA VITAMIN B-12 TR) 1000 MCG TBCR   Oral   Take by mouth.         Marland Kitchen DHEA 25 MG CAPS   Oral   Take 1 capsule by mouth 2 (two) times a week.         . Fish Oil-Cholecalciferol (FISH OIL + D3) 1000-1000 MG-UNIT CAPS   Oral   Take by mouth 1 day or 1 dose.         . folic acid (FOLVITE) Q000111Q MCG tablet   Oral   Take by mouth.         . hydroxyurea (HYDREA) 500 MG capsule   Oral   Take 1 capsule (500 mg total) by mouth daily. May take with food to minimize GI side effects.   30 capsule   2   . MAGNESIUM CITRATE PO   Oral   Take 200 mg by mouth daily.         Marland Kitchen  metoprolol succinate (TOPROL-XL) 50 MG 24 hr tablet   Oral   Take 1 tablet (50 mg total) by mouth daily. Take with or immediately following a meal.   30 tablet   0   . Probiotic Product (PROBIOTIC DAILY PO)   Oral   Take 1 tablet by mouth daily.         . progesterone (PROMETRIUM) 100 MG capsule   Oral   Take 100 mg by mouth daily.           Allergies Caffeine  Family History  Problem Relation Age of Onset  . Breast cancer Neg Hx     Social History Social History  Substance Use Topics  . Smoking status: Never Smoker   . Smokeless tobacco: None  . Alcohol Use: No    Review of Systems  Constitutional: Negative for fever. Eyes: Negative for visual changes. ENT: Negative for sore throat. Cardiovascular: Negative for chest pain. Respiratory: Negative for shortness of breath. Gastrointestinal: Negative for abdominal pain, vomiting and diarrhea. Genitourinary: Negative for dysuria. Musculoskeletal: Negative for back pain. Skin: Negative for rash. Neurological: Negative for headache. 10 point Review of  Systems otherwise negative ____________________________________________   PHYSICAL EXAM:  VITAL SIGNS: ED Triage Vitals  Enc Vitals Group     BP 11/09/15 1120 113/72 mmHg     Pulse Rate 11/09/15 1120 93     Resp 11/09/15 1120 20     Temp 11/09/15 1120 98 F (36.7 C)     Temp Source 11/09/15 1120 Oral     SpO2 11/09/15 1120 95 %     Weight 11/09/15 1120 136 lb (61.689 kg)     Height 11/09/15 1120 5\' 7"  (1.702 m)     Head Cir --      Peak Flow --      Pain Score 11/09/15 1121 3     Pain Loc --      Pain Edu? --      Excl. in [place]? --      Constitutional: Alert and oriented. Well appearing and in no distress. Eyes: Conjunctivae are normal. PERRL. Normal extraocular movements. ENT   Head: Normocephalic and atraumatic.   Nose: No congestion/rhinnorhea.   Mouth/Throat: Mucous membranes are moist.   Neck: No stridor. Cardiovascular/Chest: tachycardic and regular.  No murmurs, rubs, or gallops. Respiratory: Normal respiratory effort without tachypnea nor retractions. Breath sounds are clear and equal bilaterally. No wheezes/rales/rhonchi. Gastrointestinal: Soft. No distention, no guarding, no rebound. Nontender   Genitourinary/rectal:Deferred Musculoskeletal: Nontender with normal range of motion in all extremities. No joint effusions.  No lower extremity tenderness.  No edema. Neurologic:  Normal speech and language. No gross or focal neurologic deficits are appreciated. Skin:  Skin is warm, dry and intact. No rash noted. Psychiatric: Mood and affect are normal. Speech and behavior are normal. Patient exhibits appropriate insight and judgment.  ____________________________________________   EKG I, Lisa Roca, MD, the attending physician have personally viewed and interpreted all ECGs.  11:27 213 bpm. Supraventricular tachycardia Narrow QRS. Normal axis. ST segment depression inferiorly and laterally.  Repeat 11:54 104 bpm. Sinus tachycardia. Narrow QRS. Normal  axis. Nonspecific T wave.   ____________________________________________  LABS (pertinent positives/negatives)  Basic metabolic panel without significant abnormality White blood cell count 24.8, hemoglobin 18.8 and platelet count 297 Troponin 0.03 Repeat troponin 0.14  ____________________________________________  RADIOLOGY All Xrays were viewed by me. Imaging interpreted by Radiologist.  Chest a 2 view: No acute cardio pulmonary disease __________________________________________  PROCEDURES  Procedure(s)  performed: None  Critical Care performed: CRITICAL CARE Performed by: Lisa Roca   Total critical care time: 45 minutes  Critical care time was exclusive of separately billable procedures and treating other patients.  Critical care was necessary to treat or prevent imminent or life-threatening deterioration.  Critical care was time spent personally by me on the following activities: development of treatment plan with patient and/or surrogate as well as nursing, discussions with consultants, evaluation of patient's response to treatment, examination of patient, obtaining history from patient or surrogate, ordering and performing treatments and interventions, ordering and review of laboratory studies, ordering and review of radiographic studies, pulse oximetry and re-evaluation of patient's condition.   ____________________________________________   ED COURSE / ASSESSMENT AND PLAN  CONSULTATIONS: discussed with cardiologist Dr.Khan, recommends metoprolol succinate 50 mg once daily. He will see patient in the morning.  Pertinent labs & imaging results that were available during my care of the patient were reviewed by me and considered in my medical decision making (see chart for details).  The patient arrived to triage tachycardic then apparently returned to normal sinus rhythm, and then upon placement to room had a racing heartbeat again. EKG shows supraventricular  tachycardia with aheart rate in the 210s.  Patient with some neck pressure, but no certain chest pain. Mild trouble breathing but no shortness of breath or hypoxia. Blood pressure 123456 systolically.  I discussed the arrhythmia and management with the patient and family. Adenosine was tried with 6 mg initially, followed by 12 mg, 12 mg, and then 6 mg with no change.  Patient stated that she had a negative carotid profound recently, as I did attempt carotid massage, without success. We also tried vagal maneuvers, and there was no improvement/success with this.  Next IV diltiazem was given 10 mg slow IV bolus. Normal saline was also running as a bolus. After about 5-10 minutes, patient did convert to sinus rhythm at a heart rate of about 110 which did slowly come down to a heart rate in the 90s.  EKG while the patient was in SVT showed ST segment depression inferiorly and laterally, which was still present but just minimal in the EKG after conversion to sinus rhythm. Because of this, I did check a repeat troponin.  I discussed the case with on-call cardiologist, Dr. Yancey Flemings, who recommended starting the patient on metoprolol extended release 50 mg once daily.  He did agree to see the patient tomorrow morning.  Upon discussion of this plan with the patient, patient states she would rather follow with the cardiologist in [place] that her husband sees, and this is fine.  With regard to the patient's elevated white blood cell count and hemoglobin, patient states that this is chronic and is being followed at the cancer center with Dr. Grayland Ormond.  Troponin repeat is pending.   Repeat troponin 0.14, discussed the case with Dr. Humphrey Rolls, he suspects supply demand mismatch and not ACS, and does not recommend hospitalization this point time. He recommends discharge patient home and patient to follow-up in his office at 10 AM. I discussed this with the patient, and she will follow up in the office at 10  AM.  Patient / Family / Caregiver informed of clinical course, medical decision-making process, and agree with plan.   I discussed return precautions, follow-up instructions, and discharged instructions with patient and/or family.  ___________________________________________   FINAL CLINICAL IMPRESSION(S) / ED DIAGNOSES   Final diagnoses:  SVT (supraventricular tachycardia) ([place])  Lisa Roca, MD 11/09/15 [phone]

## 2015-11-09 NOTE — ED Notes (Signed)
Pt converted to normal Sinus Tach, MD at bedside. Pt reports feeling better, symptoms relieved. Pt denies sob and chest pain.

## 2015-11-09 NOTE — ED Notes (Signed)
Pt was at church when she began to have a "racing heart." The pt reports no chest pain, sob, LOC. Pt reports the only symptoms she has is pain to the back of neck and the feeling of heart racing.

## 2015-11-09 NOTE — Discharge Instructions (Signed)
You were evaluated for her palpitations and found to have supraventricular tachycardia. You're given IV diltiazem which converted you back to normal rhythm.  Return to the emergency department for any worsening condition including return of palpitations, heart racing, any chest pain or pressure, nausea, vomiting, sweating, shortness of breath or trouble breathing.  I recommend you follow-up with her primary care physician this week as well as a cardiologist, referred to Dr. Humphrey Rolls, or see your cardiologist of choice.   Paroxysmal Supraventricular Tachycardia Paroxysmal supraventricular tachycardia (PSVT) is when your heart beats very quickly and then suddenly stops beating so quickly. You may or may not have any symptoms when this occurs. It is usually not dangerous. It can lead to problems if it happens often or it lasts a long time. HOME CARE   Take medicines only as told by your doctor.  Avoid caffeine or limit how much of it you consume as told by your doctor. Caffeine is found in coffee, tea, soda, and chocolate.  Avoid alcohol or limit how much of it you drink as told by your doctor.  Do not smoke.  Try to get at least 7 hours of sleep each night.  Find healthy ways to reduce stress.  Do self-treatments as told by your doctor to slow down your heart (vagus nerve stimulation). Your doctor may tell you to:  Hold your breath and push, as though you are going to the bathroom.  Rub an area on one side of your neck.  Bend forward with your head between your legs.  Bend forward with your head between your legs, then cough.  Rub your eyeballs with your eyes closed.  Maintain a healthy weight.  Get some exercise on most days. Ask your doctor about some good activities for you. GET HELP IF:  You are having episodes of a fast heartbeat more often.  Your episodes are lasting longer.  Your self-treatments to slow down your heart are no longer helping.  You have new symptoms  during an episode. GET HELP RIGHT AWAY IF:  You have chest pain.  You have trouble breathing.  You have an episode of a fast heartbeat that lasts longer than 20 minutes.  You pass out (faint). These symptoms may be an emergency. Do not wait to see if the symptoms will go away. Get medical help right away. Call your local emergency services (911 in the [place] Do not drive yourself to the hospital.   This information is not intended to replace advice given to you by your health care provider. Make sure you discuss any questions you have with your health care provider.   Document Released: 12/06/2005 Document Revised: 12/27/2014 Document Reviewed: 05/16/2014 Elsevier Interactive Patient Education [employer].

## 2015-11-09 NOTE — ED Notes (Signed)
Patient transported to X-ray 

## 2015-11-09 NOTE — ED Notes (Signed)
Dr sparks has done an echo and checked her carotids two weeks ago and pt was told normal

## 2015-11-09 NOTE — ED Notes (Signed)
Pt verbalized understanding of f/u and d/c; when to return to ED.

## 2015-11-11 ENCOUNTER — Ambulatory Visit (INDEPENDENT_AMBULATORY_CARE_PROVIDER_SITE_OTHER): Payer: Medicare Other

## 2015-11-11 ENCOUNTER — Ambulatory Visit (INDEPENDENT_AMBULATORY_CARE_PROVIDER_SITE_OTHER): Payer: Medicare Other | Admitting: Cardiovascular Disease

## 2015-11-11 ENCOUNTER — Encounter: Payer: Self-pay | Admitting: Cardiovascular Disease

## 2015-11-11 VITALS — BP 124/76 | HR 86 | Ht 67.0 in | Wt 138.0 lb

## 2015-11-11 DIAGNOSIS — I471 Supraventricular tachycardia, unspecified: Secondary | ICD-10-CM

## 2015-11-11 NOTE — Addendum Note (Signed)
Addended by: Diana Eves on: 11/11/2015 01:40 PM   Modules accepted: Orders

## 2015-11-11 NOTE — Addendum Note (Signed)
Addended by: Newt Minion on: 11/11/2015 01:41 PM   Modules accepted: Orders

## 2015-11-11 NOTE — Patient Instructions (Addendum)
Medication Instructions:  Your physician recommends that you continue on your current medications as directed. Please refer to the Current Medication list given to you today.   Labwork: none  Testing/Procedures: Your physician has recommended that you wear an event monitor. Event monitors are medical devices that record the heart's electrical activity. Doctors most often [place] these monitors to diagnose arrhythmias. Arrhythmias are problems with the speed or rhythm of the heartbeat. The monitor is a small, portable device. You can wear one while you do your normal daily activities. This is usually used to diagnose what is causing palpitations/syncope (passing out).   I will contact Dr. Doy Hutching and [place] for previous ECHOs  Follow-Up: Your physician recommends that you schedule a follow-up appointment in: 3 months with Dr.Berry   Any Other Special Instructions Will Be Listed Below (If Applicable).     If you need a refill on your cardiac medications before your next appointment, please call your pharmacy.

## 2015-11-11 NOTE — Progress Notes (Signed)
11/11/2015 Renella Cunas [place]   02/07/1946  [id number]  Primary Physician SPARKS,JEFFREY D, MD Primary Cardiologist: Lorretta Harp MD Renae Gloss   HPI:  Mrs. Kenner is a delightful 69 year old fit-appearing married Caucasian female mother of 2 children, grandmother of 4 grandchildren who is self-referred for evaluation of PSVT. She basically has no cardiac risk factors. She had tachypalpitations 10 years ago which resolved spontaneously. She began having palpitations 6 months ago and has had 4 episodes lasting on average 10 minutes each until this past Sunday when she had a prolonged episode requiring her to go to [facility] emergency room. She was found to be in PSVT at a rate of 213. She was given 4 boluses of intravenous adenosine unsuccessfully and ultimately converted with IV diltiazem. She was given a prescription for metoprolol which she has not filled. Her laboratory exam is remarkable for mildly elevated troponin of 0.14 probably related to demand ischemia.   Current Outpatient Prescriptions  Medication Sig Dispense Refill  . ARMOUR THYROID 60 MG tablet TABLETS BY MOUTH TAKE ONE TABLET BY MOUTH ONE TIME DAILY  4  . ASHWAGANDHA PO Take 1 tablet by mouth.    . calcium carbonate (OSCAL) 1500 (600 CA) MG TABS tablet Take 1,200 mg by mouth 2 (two) times daily with a meal.    . Calcium Carbonate-Vitamin D (CALCIUM-VITAMIN D) 500-200 MG-UNIT per tablet Take 1 tablet by mouth 3 (three) times a week.    . Cyanocobalamin (RA VITAMIN B-12 TR) 1000 MCG TBCR Take by mouth.    Marland Kitchen DHEA 25 MG CAPS Take 1 capsule by mouth 2 (two) times a week.    . Fish Oil-Cholecalciferol (FISH OIL + D3) 1000-1000 MG-UNIT CAPS Take by mouth 1 day or 1 dose.    . folic acid (FOLVITE) Q000111Q MCG tablet Take by mouth.    . hydroxyurea (HYDREA) 500 MG capsule Take 1 capsule (500 mg total) by mouth daily. May take with food to minimize GI side effects. 30 capsule 2  . MAGNESIUM  CITRATE PO Take 200 mg by mouth daily.    . metoprolol succinate (TOPROL-XL) 50 MG 24 hr tablet Take 1 tablet (50 mg total) by mouth daily. Take with or immediately following a meal. 30 tablet 0  . Probiotic Product (PROBIOTIC DAILY PO) Take 1 tablet by mouth daily.    . progesterone (PROMETRIUM) 100 MG capsule Take 100 mg by mouth daily.     No current facility-administered medications for this visit.    Allergies  Allergen Reactions  . Caffeine     Social History   Social History  . Marital Status: Married    Spouse Name: N/A  . Number of Children: N/A  . Years of Education: N/A   Occupational History  . Not on file.   Social History Main Topics  . Smoking status: Never Smoker   . Smokeless tobacco: Not on file  . Alcohol Use: No  . Drug Use: Not on file  . Sexual Activity: Not on file   Other Topics Concern  . Not on file   Social History Narrative     Review of Systems: General: negative for chills, fever, night sweats or weight changes.  Cardiovascular: negative for chest pain, dyspnea on exertion, edema, orthopnea, palpitations, paroxysmal nocturnal dyspnea or shortness of breath Dermatological: negative for rash Respiratory: negative for cough or wheezing Urologic: negative for hematuria Abdominal: negative for nausea, vomiting, diarrhea, bright red blood per rectum, melena, or  hematemesis Neurologic: negative for visual changes, syncope, or dizziness All other systems reviewed and are otherwise negative except as noted above.    Blood pressure 124/76, pulse 86, height 5\' 7"  (1.702 m), weight 138 lb (62.596 kg).  General appearance: alert and no distress Neck: no adenopathy, no carotid bruit, no JVD, supple, symmetrical, trachea midline and thyroid not enlarged, symmetric, no tenderness/mass/nodules Lungs: clear to auscultation bilaterally Heart: regular rate and rhythm, S1, S2 normal, no murmur, click, rub or gallop Extremities: extremities normal,  atraumatic, no cyanosis or edema  EKG not performed today  ASSESSMENT AND PLAN:   PSVT (paroxysmal supraventricular tachycardia) ([place]) Mrs. Sitler was referred for evaluation of PSVT. She had tachycardia palpitations remotely 10 years ago which subsided spontaneously. Over the last 6 months she's had 4 episodes of PSVT lasting up to 10 minutes at a time however this past Sunday the episode lasted for over an hour which resulted in her going to the [facility] emergency room. She was found to be in PSVT with a heart rate of  213. She was given 4 boluses of intravenous adenosine without effect ultimately she converted with IV diltiazem back to sinus rhythm. She did feel somewhat presyncopal but denied chest pain or shortness of breath. Her labs were remarkable for a mildly elevated troponin of 0.14.  She really has had a 2-D echo cardiogram 6 months ago by her PCP Dr. Doy Hutching as well as yesterday by a cardiologist in [facility] Dr. Humphrey Rolls.       Lorretta Harp MD FACP,FACC,FAHA, [facility] 11/11/2015 10:59 AM

## 2015-11-11 NOTE — Assessment & Plan Note (Signed)
Priscilla Berry was referred for evaluation of PSVT. She had tachycardia palpitations remotely 10 years ago which subsided spontaneously. Over the last 6 months she's had 4 episodes of PSVT lasting up to 10 minutes at a time however this past Sunday the episode lasted for over an hour which resulted in her going to the [facility] emergency room. She was found to be in PSVT with a heart rate of  213. She was given 4 boluses of intravenous adenosine without effect ultimately she converted with IV diltiazem back to sinus rhythm. She did feel somewhat presyncopal but denied chest pain or shortness of breath. Her labs were remarkable for a mildly elevated troponin of 0.14.  She really has had a 2-D echo cardiogram 6 months ago by her PCP Dr. Doy Hutching as well as yesterday by a cardiologist in [facility] Dr. Humphrey Rolls.

## 2015-11-19 ENCOUNTER — Telehealth: Payer: Self-pay | Admitting: Internal Medicine

## 2015-11-19 ENCOUNTER — Encounter: Payer: Self-pay | Admitting: Emergency Medicine

## 2015-11-19 ENCOUNTER — Emergency Department: Payer: Medicare Other

## 2015-11-19 ENCOUNTER — Emergency Department
Admission: EM | Admit: 2015-11-19 | Discharge: 2015-11-20 | Disposition: A | Discharge status: Home / Self Care | Payer: Medicare Other | Attending: Student | Admitting: Student

## 2015-11-19 DIAGNOSIS — I471 Supraventricular tachycardia: Secondary | ICD-10-CM | POA: Diagnosis not present

## 2015-11-19 DIAGNOSIS — R002 Palpitations: Secondary | ICD-10-CM | POA: Diagnosis present

## 2015-11-19 DIAGNOSIS — Z79899 Other long term (current) drug therapy: Secondary | ICD-10-CM | POA: Insufficient documentation

## 2015-11-19 LAB — CBC WITH DIFFERENTIAL/PLATELET
BASOS ABS: 0.2 10*3/uL — AB (ref 0–0.1)
Eosinophils Absolute: 0.6 10*3/uL (ref 0–0.7)
Eosinophils Relative: 3 %
HEMATOCRIT: 57.2 % — AB (ref 35.0–47.0)
HEMOGLOBIN: 17.6 g/dL — AB (ref 12.0–16.0)
Lymphocytes Relative: 13 %
Lymphs Abs: 2.2 10*3/uL (ref 1.0–3.6)
MCH: 22.6 pg — ABNORMAL LOW (ref 26.0–34.0)
MCHC: 30.7 g/dL — ABNORMAL LOW (ref 32.0–36.0)
MCV: 73.5 fL — ABNORMAL LOW (ref 80.0–100.0)
Monocytes Absolute: 0.7 10*3/uL (ref 0.2–0.9)
Monocytes Relative: 4 %
NEUTROS ABS: 13.7 10*3/uL — AB (ref 1.4–6.5)
Platelets: 204 10*3/uL (ref 150–440)
RBC: 7.77 MIL/uL — ABNORMAL HIGH (ref 3.80–5.20)
RDW: 29.9 % — ABNORMAL HIGH (ref 11.5–14.5)
WBC: 17.4 10*3/uL — ABNORMAL HIGH (ref 3.6–11.0)

## 2015-11-19 LAB — BASIC METABOLIC PANEL
ANION GAP: 10 (ref 5–15)
BUN: 19 mg/dL (ref 6–20)
CHLORIDE: 107 mmol/L (ref 101–111)
CO2: 24 mmol/L (ref 22–32)
Calcium: 9.5 mg/dL (ref 8.9–10.3)
Creatinine, Ser: 0.86 mg/dL (ref 0.44–1.00)
GFR calc non Af Amer: >60 mL/min (ref >60)
Glucose, Bld: 139 mg/dL — ABNORMAL HIGH (ref 65–99)
POTASSIUM: 4 mmol/L (ref 3.5–5.1)
Sodium: 141 mmol/L (ref 135–145)

## 2015-11-19 LAB — TROPONIN I: Troponin I: 0.08 ng/mL — ABNORMAL HIGH (ref <0.031)

## 2015-11-19 LAB — T4, FREE: FREE T4: 0.93 ng/dL (ref 0.61–1.12)

## 2015-11-19 LAB — TSH: TSH: 12.233 u[IU]/mL — AB (ref 0.350–4.500)

## 2015-11-19 MED ORDER — SODIUM CHLORIDE 0.9 % IV BOLUS (SEPSIS)
500.0000 mL | Freq: Once | INTRAVENOUS | Status: AC
  Administered 2015-11-19: 500 mL via INTRAVENOUS

## 2015-11-19 MED ORDER — METOPROLOL SUCCINATE ER 50 MG PO TB24
50.0000 mg | ORAL_TABLET | Freq: Every day | ORAL | Status: DC
  Administered 2015-11-19: 50 mg via ORAL
  Filled 2015-11-19: qty 1

## 2015-11-19 NOTE — Telephone Encounter (Signed)
Notified by [employer] Ms. Shull with SVT >200 on remote monitor. I attempted to call patient with no response. I spoke with [employer] again and they were trying to call patient. On entry of this note, patient noticed to be in [place] [facility]. Will stop trying to call patient now. I also notified [employer] patient is in the ER.   Jules Husbands, MD

## 2015-11-19 NOTE — ED Notes (Signed)
Pt arrived to the ED for complaints of palpitations and chest heaviness. Pt reports that she was seen in this ED 4 days ago for something similar and then her heart rate was 210bpm. Pt is AOx4 anxious.

## 2015-11-19 NOTE — ED Provider Notes (Signed)
[facility] Emergency Department Provider Note  ____________________________________________  Time seen: Approximately 9:07 PM  I have reviewed the triage vital signs and the nursing notes.   HISTORY  Chief Complaint Palpitations    HPI Priscilla Berry is a 69 y.o. 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 Patient was seen here on 11/09/2015 for paroxysmal SVT with rate in the 200s which was unresponsive to adenosine and required Cardizem. She was discharged with cardiology follow-up and instructions to begin taking metoprolol however she has not yet filled his prescription. She denies any chest pain, difficulty breathing or lightheadedness, suffering only from palpitations at this time. No recent illness including no cough, sneezing, runny nose, congestion, vomiting, diarrhea, fevers or chills.   Past Medical History  Diagnosis Date  . Hyperthyroidism   . Polio   . PSVT (paroxysmal supraventricular tachycardia) [facility])     Patient Active Problem List   Diagnosis Date Noted  . PSVT (paroxysmal supraventricular tachycardia) ([place]) 11/11/2015  . Acquired hypothyroidism 10/06/2015  . 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 Breast surgery      breast biopsies  . Tonsillectomy    . Colonoscopy with propofol N/A 07/04/2015    Procedure: COLONOSCOPY WITH PROPOFOL;  Surgeon: Manya Silvas, MD;  Location: [facility] ENDOSCOPY;  Service: Endoscopy;  Laterality: N/A;  . Breast biopsy Right 1999    EXCISIONAL - NEG  . Breast biopsy Right 2012    EXCISIONAL - NEG    Current Outpatient Rx  Name  Route  Sig  Dispense  Refill  . ARMOUR THYROID 60 MG tablet      TABLETS BY MOUTH TAKE ONE TABLET BY MOUTH ONE  TIME DAILY      4     Dispense as written.   . ASHWAGANDHA PO   Oral   Take 1 tablet by mouth.         . calcium carbonate (OSCAL) 1500 (600 CA) MG TABS tablet   Oral   Take 1,200 mg by mouth 2 (two) times daily with a meal.         . Calcium Carbonate-Vitamin D (CALCIUM-VITAMIN D) 500-200 MG-UNIT per tablet   Oral   Take 1 tablet by mouth 3 (three) times a week.         . Cyanocobalamin (RA VITAMIN B-12 TR) 1000 MCG TBCR   Oral   Take by mouth.         Marland Kitchen DHEA 25 MG CAPS   Oral   Take 1 capsule by mouth 2 (two) times a week.         . Fish Oil-Cholecalciferol (FISH OIL + D3) 1000-1000 MG-UNIT CAPS   Oral   Take by mouth 1 day or 1 dose.         . folic acid (FOLVITE) Q000111Q MCG tablet   Oral   Take by mouth.         . hydroxyurea (HYDREA) 500 MG capsule   Oral   Take 1 capsule (500 mg total) by mouth daily. May take with food to minimize GI side effects.   30 capsule   2   . MAGNESIUM CITRATE PO   Oral   Take 200 mg by mouth daily.         Marland Kitchen  metoprolol succinate (TOPROL-XL) 50 MG 24 hr tablet   Oral   Take 1 tablet (50 mg total) by mouth daily. Take with or immediately following a meal.   30 tablet   0   . Probiotic Product (PROBIOTIC DAILY PO)   Oral   Take 1 tablet by mouth daily.         . progesterone (PROMETRIUM) 100 MG capsule   Oral   Take 100 mg by mouth daily.           Allergies Caffeine  Family History  Problem Relation Age of Onset  . Breast cancer Neg Hx     Social History Social History  Substance Use Topics  . Smoking status: Never Smoker   . Smokeless tobacco: None  . Alcohol Use: No    Review of Systems Constitutional: No fever/chills Eyes: No visual changes. ENT: No sore throat. Cardiovascular: Denies chest pain. Respiratory: Denies shortness of breath. Gastrointestinal: No abdominal pain.  No nausea, no vomiting.  No diarrhea.  No constipation. Genitourinary: Negative for dysuria. Musculoskeletal:  Negative for back pain. Skin: Negative for rash. Neurological: Negative for headaches, focal weakness or numbness.  10-point ROS otherwise negative.  ____________________________________________   PHYSICAL EXAM:  VITAL SIGNS: ED Triage Vitals  Enc Vitals Group     BP 11/19/15 2052 111/85 mmHg     Pulse Rate 11/19/15 2052 86     Resp 11/19/15 2052 20     Temp 11/19/15 2052 98.2 F (36.8 C)     Temp Source 11/19/15 2052 Oral     SpO2 11/19/15 2052 95 %     Weight 11/19/15 2052 137 lb (62.143 kg)     Height 11/19/15 2052 5\' 7"  (1.702 m)     Head Cir --      Peak Flow --      Pain Score 11/19/15 2053 0     Pain Loc --      Pain Edu? --      Excl. in [place]? --     Constitutional: Alert and oriented. Well appearing and in no acute distress. Eyes: Conjunctivae are normal. PERRL. EOMI. Head: Atraumatic. Nose: No congestion/rhinnorhea. Mouth/Throat: Mucous membranes are moist.  Oropharynx non-erythematous. Neck: No stridor.   Cardiovascular: tachycardic rate, regular rhythm. Grossly normal heart sounds.  Good peripheral circulation. Respiratory: Normal respiratory effort.  No retractions. Lungs CTAB. Gastrointestinal: Soft and nontender. No distention. No CVA tenderness. Genitourinary: deferred Musculoskeletal: No lower extremity tenderness nor edema.  No joint effusions. Neurologic:  Normal speech and language. No gross focal neurologic deficits are appreciated. No gait instability. Skin:  Skin is warm, dry and intact. No rash noted. Psychiatric: Mood and affect are normal. Speech and behavior are normal.  ____________________________________________   LABS (all labs ordered are listed, but only abnormal results are displayed)  Labs Reviewed  CBC WITH DIFFERENTIAL/PLATELET - Abnormal; Notable for the following:    WBC 17.4 (*)    RBC 7.77 (*)    Hemoglobin 17.6 (*)    HCT 57.2 (*)    MCV 73.5 (*)    MCH 22.6 (*)    MCHC 30.7 (*)    RDW 29.9 (*)    Neutro Abs 13.7 (*)     Basophils Absolute 0.2 (*)    All other components within normal limits  BASIC METABOLIC PANEL - Abnormal; Notable for the following:    Glucose, Bld 139 (*)    All other components within normal limits  TROPONIN I - Abnormal; Notable  for the following:    Troponin I 0.08 (*)    All other components within normal limits  TSH - Abnormal; Notable for the following:    TSH 12.233 (*)    All other components within normal limits  T4, FREE   ____________________________________________  EKG  ED ECG REPORT I, Joanne Gavel, the attending physician, personally viewed and interpreted this ECG.   Date: 11/19/2015  EKG Time: 20:52  Rate: 211  Rhythm: Supraventricular tachycardia.  Axis: Normal  Intervals:none  ST&T Change: Diffuse ST depression.  ED ECG REPORT I, Joanne Gavel, the attending physician, personally viewed and interpreted this ECG.   Date: 11/19/2015  EKG Time: 21:07  Rate: 108  Rhythm: sinus tachycardia  Axis: normal  Intervals:right bundle branch block, incomplete  ST&T Change: No acute ST elevation. Resolution of ST depression.   ____________________________________________  RADIOLOGY  CXR  IMPRESSION: No active disease.  ____________________________________________   PROCEDURES  Procedure(s) performed: None  Critical Care performed: No  ____________________________________________   INITIAL IMPRESSION / ASSESSMENT AND PLAN / ED COURSE  Pertinent labs & imaging results that were available during my care of the patient were reviewed by me and considered in my medical decision making (see chart for details).  Priscilla Berry is a 69 y.o. 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 On arrival to triage, the patient was noted on EKG to  be in SVT with a heart rate of approximately 210 bpm, she was brought back immediately to ER room 16. By the time she was transferred from wheelchair to bed and hooked up to the cardiac monitor, she had spontaneously converted to sinus tachycardia without any ER intervention. She currently feels well. She is now asymptomatic. Plan for screening labs, observation in the emergency department, chest x-ray. We'll give her prescribed dose of metoprolol.  ----------------------------------------- 11:55 PM on 11/19/2015 -----------------------------------------  Patient now in normal sinus rhythm with heart rate 84 bpm. Labs reviewed. CBC is notable for chronic leukocytosis and elevation and hemoglobin however this is chronic on chart review. BMP is unremarkable. TSH is elevated at 12 however T4 is normal. Troponin is slightly elevated at 0.08, down trending from 0.14 on 11/09/2015. I discussed her case with Dr. Claiborne Billings of [facility], on call for patient's cardiologist, Dr. Quay Burow and he agrees that this likely represents demand mismatch and is unlikely to represent ACS. He does not feel that the patient requires admission and I agree with this. Patient feels well and is requesting discharge. she will follow-up with her cardiologist tomorrow. I encouraged her to fill her prescription for metoprolol and to begin taking it. She voices understanding of this and is comfortable with the discharge plan.   ____________________________________________   FINAL CLINICAL IMPRESSION(S) / ED DIAGNOSES  Final diagnoses:  Heart palpitations  SVT (supraventricular tachycardia) (HCC)      Joanne Gavel, MD 11/19/15 2359

## 2015-11-20 NOTE — ED Notes (Signed)
Patient discharged to home per MD order. Patient in stable condition, and deemed medically cleared by ED provider for discharge. Discharge instructions reviewed with patient/family using "Teach Back"; verbalized understanding of medication education and administration, and information about follow-up care. Denies further concerns. ° °

## 2015-11-21 ENCOUNTER — Ambulatory Visit (INDEPENDENT_AMBULATORY_CARE_PROVIDER_SITE_OTHER): Payer: Medicare Other | Admitting: Cardiovascular Disease

## 2015-11-21 ENCOUNTER — Encounter: Payer: Self-pay | Admitting: Cardiovascular Disease

## 2015-11-21 ENCOUNTER — Telehealth: Payer: Self-pay | Admitting: Cardiovascular Disease

## 2015-11-21 ENCOUNTER — Telehealth: Payer: Self-pay | Admitting: *Deleted

## 2015-11-21 VITALS — BP 134/78 | HR 72 | Ht 67.0 in | Wt 139.0 lb

## 2015-11-21 DIAGNOSIS — R079 Chest pain, unspecified: Secondary | ICD-10-CM

## 2015-11-21 DIAGNOSIS — I471 Supraventricular tachycardia: Secondary | ICD-10-CM

## 2015-11-21 DIAGNOSIS — R002 Palpitations: Secondary | ICD-10-CM

## 2015-11-21 NOTE — Telephone Encounter (Signed)
Patient was here today and saw Dr. Gwenlyn Found.  She is scheduled for a stress test on 11/28/15 and has an appointment with Dr. Lovena Le on 11/26/15.  DOes she need to have the stress test prior to seeing Dr. Lovena Le?

## 2015-11-21 NOTE — Patient Instructions (Signed)
Medication Instructions:  Your physician recommends that you continue on your current medications as directed. Please refer to the Current Medication list given to you today.   Labwork: none  Testing/Procedures: Your physician has requested that you have a lexiscan myoview. For further information please visit [email or web address]. Please follow instruction sheet, as given. - NEXT WEEK   Follow-Up: Your physician recommends that you schedule a follow-up appointment in: NEXT WEEK WITH DR. ALLRED  Your physician wants you to follow-up in: 3 months with Dr. Gwenlyn Found. You will receive a reminder letter in the mail two months in advance. If you don't receive a letter, please call our office to schedule the follow-up appointment.   Any Other Special Instructions Will Be Listed Below (If Applicable).     If you need a refill on your cardiac medications before your next appointment, please call your pharmacy.

## 2015-11-21 NOTE — Telephone Encounter (Signed)
She will call back to reschedule once she starts her medication

## 2015-11-21 NOTE — Telephone Encounter (Signed)
Ok to push appt back to mid/late January.

## 2015-11-21 NOTE — Assessment & Plan Note (Signed)
Priscilla Berry returns today for early follow-up with PSVT. I saw her approximately a week and a half ago. She was seen Dr. Sel[facility] 2 days ago with symptomatic PSVT and heart rates of greater than 200. She just filled her beta blocker prescription. Her troponins were mildly elevated. I'm going to get a Lexiscan  Myoview stress test on her and refer her to undergo elective physiologist next week for further evaluation. She may ultimately require ablation.

## 2015-11-21 NOTE — Progress Notes (Signed)
11/21/2015 Priscilla Berry   02-05-1946  [id number]  Primary Physician SPARKS,JEFFREY D, MD Primary Cardiologist: Lorretta Harp MD Renae Gloss    HPI: Priscilla Berry is a delightful 69 year old fit-appearing married Caucasian female mother of 2 children, grandmother of 4 grandchildren who is self-referred for evaluation of PSVT. I saw her last 11/11/15. She basically has no cardiac risk factors. She had tachypalpitations 10 years ago which resolved spontaneously. She began having palpitations 6 months ago and has had 4 episodes lasting on average 10 minutes each until this past Sunday when she had a prolonged episode requiring her to go to [facility] emergency room. She was found to be in PSVT at a rate of 213. She was given 4 boluses of intravenous adenosine unsuccessfully and ultimately converted with IV diltiazem. She was given a prescription for metoprolol which she has only recently filled. Her laboratory exam is remarkable for mildly elevated troponin of 0.14 probably related to demand ischemia. She was seen back in the emergency room on 11/19/15 with PSVT and heart rates greater than 200. This spontaneously resolved. During this episode she felt somewhat presyncopal with some mild chest pressure.   Current Outpatient Prescriptions  Medication Sig Dispense Refill  . ARMOUR THYROID 60 MG tablet TABLETS BY MOUTH TAKE ONE TABLET BY MOUTH ONE TIME DAILY  4  . ASHWAGANDHA PO Take 1 tablet by mouth.    . calcium carbonate (OSCAL) 1500 (600 CA) MG TABS tablet Take 1,200 mg by mouth 2 (two) times daily with a meal.    . Calcium Carbonate-Vitamin D (CALCIUM-VITAMIN D) 500-200 MG-UNIT per tablet Take 1 tablet by mouth 3 (three) times a week.    . Cyanocobalamin (RA VITAMIN B-12 TR) 1000 MCG TBCR Take by mouth.    Marland Kitchen DHEA 25 MG CAPS Take 1 capsule by mouth 2 (two) times a week.    . Fish Oil-Cholecalciferol (FISH OIL + D3) 1000-1000 MG-UNIT CAPS Take by  mouth 1 day or 1 dose.    . folic acid (FOLVITE) Q000111Q MCG tablet Take by mouth.    . hydroxyurea (HYDREA) 500 MG capsule Take 1 capsule (500 mg total) by mouth daily. May take with food to minimize GI side effects. 30 capsule 2  . MAGNESIUM CITRATE PO Take 200 mg by mouth daily.    . metoprolol succinate (TOPROL-XL) 50 MG 24 hr tablet Take 1 tablet (50 mg total) by mouth daily. Take with or immediately following a meal. 30 tablet 0  . Probiotic Product (PROBIOTIC DAILY PO) Take 1 tablet by mouth daily.    . progesterone (PROMETRIUM) 100 MG capsule Take 100 mg by mouth daily.     No current facility-administered medications for this visit.    Allergies  Allergen Reactions  . Caffeine     Social History   Social History  . Marital Status: Married    Spouse Name: N/A  . Number of Children: N/A  . Years of Education: N/A   Occupational History  . Not on file.   Social History Main Topics  . Smoking status: Never Smoker   . Smokeless tobacco: Not on file  . Alcohol Use: No  . Drug Use: Not on file  . Sexual Activity: Not on file   Other Topics Concern  . Not on file   Social History Narrative     Review of Systems: General: negative for chills, fever, night sweats or weight changes.  Cardiovascular: negative for chest pain, dyspnea  on exertion, edema, orthopnea, palpitations, paroxysmal nocturnal dyspnea or shortness of breath Dermatological: negative for rash Respiratory: negative for cough or wheezing Urologic: negative for hematuria Abdominal: negative for nausea, vomiting, diarrhea, bright red blood per rectum, melena, or hematemesis Neurologic: negative for visual changes, syncope, or dizziness All other systems reviewed and are otherwise negative except as noted above.    Blood pressure 134/78, pulse 72, height 5\' 7"  (1.702 m), weight 139 lb (63.05 kg).  General appearance: alert and no distress Neck: no adenopathy, no carotid bruit, no JVD, supple, symmetrical,  trachea midline and thyroid not enlarged, symmetric, no tenderness/mass/nodules Lungs: clear to auscultation bilaterally Heart: regular rate and rhythm, S1, S2 normal, no murmur, click, rub or gallop Extremities: extremities normal, atraumatic, no cyanosis or edema  EKG not performed today  ASSESSMENT AND PLAN:   PSVT (paroxysmal supraventricular tachycardia) ([place]) Priscilla Berry returns today for early follow-up with PSVT. I saw her approximately a week and a half ago. She was seen Dr. [facility] 2 days ago with symptomatic PSVT and heart rates of greater than 200. She just filled her beta blocker prescription. Her troponins were mildly elevated. I'm going to get a Lexiscan  Myoview stress test on her and refer her to undergo elective physiologist next week for further evaluation. She may ultimately require ablation.      Lorretta Harp MD FACP,FACC,FAHA, [facility] 11/21/2015 11:04 AM

## 2015-11-21 NOTE — Telephone Encounter (Signed)
Questions answered regarding stress test procedure.  Pt had remaining questions regarding ablation, answered in general capacity but advised her to defer specific questions regarding procedure for physician consult on 12/7.  Pt agreeable to this.  No further concerns at this time.

## 2015-11-21 NOTE — Telephone Encounter (Signed)
Has been in and out of ER with SVT so she has not started the hydrea waiting to get her heart straightened out. She is having testing done. She wanted you to know and asks if she needs to reschedule her fu appts since she has not taken med yet

## 2015-11-26 ENCOUNTER — Other Ambulatory Visit: Payer: Self-pay | Admitting: *Deleted

## 2015-11-26 ENCOUNTER — Ambulatory Visit (INDEPENDENT_AMBULATORY_CARE_PROVIDER_SITE_OTHER): Payer: Medicare Other | Admitting: Internal Medicine

## 2015-11-26 ENCOUNTER — Encounter: Payer: Self-pay | Admitting: *Deleted

## 2015-11-26 ENCOUNTER — Encounter: Payer: Self-pay | Admitting: Internal Medicine

## 2015-11-26 ENCOUNTER — Telehealth (HOSPITAL_COMMUNITY): Payer: Self-pay

## 2015-11-26 VITALS — BP 118/78 | HR 81 | Ht 67.0 in | Wt 137.4 lb

## 2015-11-26 DIAGNOSIS — Z01812 Encounter for preprocedural laboratory examination: Secondary | ICD-10-CM

## 2015-11-26 DIAGNOSIS — I471 Supraventricular tachycardia: Secondary | ICD-10-CM

## 2015-11-26 NOTE — Patient Instructions (Signed)
Medication Instructions:  Your physician recommends that you continue on your current medications as directed. Please refer to the Current Medication list given to you today.  Labwork: Your physician recommends that you return for lab work on: 12/10/15 for pre procedure labs.  Testing/Procedures: Your physician has recommended that you have an SVT ablation. Catheter ablation is a medical procedure used to treat some cardiac arrhythmias (irregular heartbeats). During catheter ablation, a long, thin, flexible tube is put into a blood vessel in your groin (upper thigh), or neck. This tube is called an ablation catheter. It is then guided to your heart through the blood vessel. Radio frequency waves destroy small areas of heart tissue where abnormal heartbeats may cause an arrhythmia to start. Please see the instruction sheet given to you today.  Follow-Up: Your physician recommends that you schedule a follow-up appointment in: 4 weeks, after 12/16/15, with Amber/Renee for post ablation follow up.  Your physician recommends that you schedule a follow-up appointment in: 3 months, after 12/16/15, with Dr. Lovena Le.   If you need a refill on your cardiac medications before your next appointment, please call your pharmacy.  Thank you for choosing [facility]!!     Cardiac Ablation Cardiac ablation is a procedure to disable a small amount of heart tissue in very specific places. The heart has many electrical connections. Sometimes these connections are abnormal and can cause the heart to beat very fast or irregularly. By disabling some of the problem areas, heart rhythm can be improved or made normal. Ablation is done for people who:   Have Wolff-Parkinson-White syndrome.   Have other fast heart rhythms (tachycardia).   Have taken medicines for an abnormal heart rhythm (arrhythmia) that resulted in:   No success.   Side effects.   May have a high-risk heartbeat that could result in  death.  LET [facility] CARE PROVIDER KNOW ABOUT:   Any allergies you have or any previous reactions you have had to X-ray dye, food (such as seafood), medicine, or tape.   All medicines you are taking, including vitamins, herbs, eye drops, creams, and over-the-counter medicines.   Previous problems you or members of your family have had with the use of anesthetics.   Any blood disorders you have.   Previous surgeries or procedures (such as a kidney transplant) you have had.   Medical conditions you have (such as kidney failure).  RISKS AND COMPLICATIONS Generally, cardiac ablation is a safe procedure. However, problems can occur and include:   Increased risk of cancer. Depending on how long it takes to do the ablation, the dose of radiation can be high.  Bruising and bleeding where a thin, flexible tube (catheter) was inserted during the procedure.   Bleeding into the chest, especially into the sac that surrounds the heart (serious).  Need for a permanent pacemaker if the normal electrical system is damaged.   The procedure may not be fully effective, and this may not be recognized for months. Repeat ablation procedures are sometimes required. BEFORE THE PROCEDURE   Follow any instructions from your health care provider regarding eating and drinking before the procedure.   Take your medicines as directed at regular times with water, unless instructed otherwise by your health care provider. If you are taking diabetes medicine, including insulin, ask how you are to take it and if there are any special instructions you should follow. It is common to adjust insulin dosing the day of the ablation.  PROCEDURE  An ablation is  usually performed in a catheterization laboratory with the guidance of fluoroscopy. Fluoroscopy is a type of X-ray that helps your health care provider see images of your heart during the procedure.   An ablation is a minimally invasive procedure. This  means a small cut (incision) is made in either your neck or groin. Your health care provider will decide where to make the incision based on your medical history and physical exam.  An IV tube will be started before the procedure begins. You will be given an anesthetic or medicine to help you relax (sedative).  The skin on your neck or groin will be numbed. A needle will be inserted into a large vein in your neck or groin and catheters will be threaded to your heart.  A special dye that shows up on fluoroscopy pictures may be injected through the catheter. The dye helps your health care provider see the area of the heart that needs treatment.  The catheter has electrodes on the tip. When the area of heart tissue that is causing the arrhythmia is found, the catheter tip will send an electrical current to the area and "scar" the tissue. Three types of energy can be used to ablate the heart tissue:   Heat (radiofrequency energy).   Laser energy.   Extreme cold (cryoablation).   When the area of the heart has been ablated, the catheter will be taken out. Pressure will be held on the insertion site. This will help the insertion site clot and keep it from bleeding. A bandage will be placed on the insertion site.  AFTER THE PROCEDURE   After the procedure, you will be taken to a recovery area where your vital signs (blood pressure, heart rate, and breathing) will be monitored. The insertion site will also be monitored for bleeding.   You will need to lie still for 4-6 hours. This is to ensure you do not bleed from the catheter insertion site.    This information is not intended to replace advice given to you by your health care provider. Make sure you discuss any questions you have with your health care provider.   Document Released: 04/24/2009 Document Revised: 12/27/2014 Document Reviewed: 04/30/2013 Elsevier Interactive Patient Education [employer].

## 2015-11-26 NOTE — Telephone Encounter (Signed)
Encounter complete. 

## 2015-11-26 NOTE — Assessment & Plan Note (Signed)
I have discussed the treatment options with the patient. The risks/benefits/goals/expectations of SVT have been reviewed and she would like to proceed with ablation.

## 2015-11-26 NOTE — Progress Notes (Signed)
HPI [name] is referred by Dr. Gwenlyn Found for consideration of catheter ablation of SVT. She is a pleasant 69 yo woman with palpitations dating back 20-30 years. She did not intially seek medical attention. Over the past year she has had increasingly frequent episodes and was found to have a narrow QRS tachycardia at over 200/min. She has been treated with beta blockers which have improved her symptoms. She however has had trouble taking these medications and wants to stop her beta blocker. With her SVT, she has near syncope, chest pain and sob.  Allergies  Allergen Reactions  . Caffeine      Current Outpatient Prescriptions  Medication Sig Dispense Refill  . ASHWAGANDHA PO Take 1 tablet by mouth.    . calcium carbonate (OSCAL) 1500 (600 CA) MG TABS tablet Take 1,200 mg by mouth 2 (two) times daily with a meal.    . Calcium Carbonate-Vitamin D (CALCIUM-VITAMIN D) 500-200 MG-UNIT per tablet Take 1 tablet by mouth 3 (three) times a week.    . Cyanocobalamin (RA VITAMIN B-12 TR) 1000 MCG TBCR Take by mouth.    Marland Kitchen DHEA 25 MG CAPS Take 1 capsule by mouth 2 (two) times a week.    . Fish Oil-Cholecalciferol (FISH OIL + D3) 1000-1000 MG-UNIT CAPS Take by mouth 1 day or 1 dose.    . folic acid (FOLVITE) Q000111Q MCG tablet Take by mouth.    Marland Kitchen MAGNESIUM CITRATE PO Take 200 mg by mouth daily.    . metoprolol succinate (TOPROL-XL) 50 MG 24 hr tablet Take 1 tablet (50 mg total) by mouth daily. Take with or immediately following a meal. 30 tablet 0  . Probiotic Product (PROBIOTIC DAILY PO) Take 1 tablet by mouth daily.    . progesterone (PROMETRIUM) 100 MG capsule Take 100 mg by mouth daily.     No current facility-administered medications for this visit.     Past Medical History  Diagnosis Date  . Hyperthyroidism   . Polio   . PSVT (paroxysmal supraventricular tachycardia) (HCC)     ROS:   All systems reviewed and negative except as noted in the HPI.   Past Surgical History  Procedure  Laterality Date  . Breast surgery      breast biopsies  . Tonsillectomy    . Colonoscopy with propofol N/A 07/04/2015    Procedure: COLONOSCOPY WITH PROPOFOL;  Surgeon: Manya Silvas, MD;  Location: [facility] ENDOSCOPY;  Service: Endoscopy;  Laterality: N/A;  . Breast biopsy Right 1999    EXCISIONAL - NEG  . Breast biopsy Right 2012    EXCISIONAL - NEG     Family History  Problem Relation Age of Onset  . Breast cancer Neg Hx      Social History   Social History  . Marital Status: Married    Spouse Name: N/A  . Number of Children: N/A  . Years of Education: N/A   Occupational History  . Not on file.   Social History Main Topics  . Smoking status: Never Smoker   . Smokeless tobacco: Not on file  . Alcohol Use: No  . Drug Use: Not on file  . Sexual Activity: Not on file   Other Topics Concern  . Not on file   Social History Narrative     BP 118/78 mmHg  Pulse 81  Ht 5\' 7"  (1.702 m)  Wt 137 lb 6.4 oz (62.324 kg)  BMI 21.51 kg/m2  Physical Exam:  Well appearing 69  yo man,  NAD HEENT: Unremarkable Neck:  6 cm JVD, no thyromegally Lymphatics:  No adenopathy Back:  No CVA tenderness Lungs:  Clear with no wheezes HEART:  Regular rate rhythm, no murmurs, no rubs, no clicks Abd:  soft, positive bowel sounds, no organomegally, no rebound, no guarding Ext:  2 plus pulses, no edema, no cyanosis, no clubbing Skin:  No rashes no nodules Neuro:  CN II through XII intact, motor grossly intact  EKG - nsr with no pre-excitation   Assess/Plan:

## 2015-11-28 ENCOUNTER — Ambulatory Visit (HOSPITAL_COMMUNITY)
Admission: RE | Admit: 2015-11-28 | Discharge: 2015-11-28 | Disposition: A | Discharge status: Home / Self Care | Payer: Medicare Other | Source: Ambulatory Visit | Attending: Cardiology | Admitting: Cardiology

## 2015-11-28 DIAGNOSIS — R002 Palpitations: Secondary | ICD-10-CM

## 2015-11-28 DIAGNOSIS — I471 Supraventricular tachycardia: Secondary | ICD-10-CM | POA: Diagnosis not present

## 2015-11-28 DIAGNOSIS — R079 Chest pain, unspecified: Secondary | ICD-10-CM | POA: Diagnosis not present

## 2015-11-28 LAB — MYOCARDIAL PERFUSION IMAGING
CHL CUP NUCLEAR SRS: 2
CHL CUP RESTING HR STRESS: 71 {beats}/min
LV dias vol: 123 mL
LV sys vol: 53 mL
NUC STRESS TID: 1.05
Peak HR: 101 {beats}/min
SDS: 0
SSS: 2

## 2015-11-28 MED ORDER — TECHNETIUM TC 99M SESTAMIBI GENERIC - CARDIOLITE
31.0000 | Freq: Once | INTRAVENOUS | Status: AC | PRN
  Administered 2015-11-28: 31 via INTRAVENOUS

## 2015-11-28 MED ORDER — REGADENOSON 0.4 MG/5ML IV SOLN
0.4000 mg | Freq: Once | INTRAVENOUS | Status: AC
  Administered 2015-11-28: 0.4 mg via INTRAVENOUS

## 2015-11-28 MED ORDER — AMINOPHYLLINE 25 MG/ML IV SOLN
75.0000 mg | Freq: Once | INTRAVENOUS | Status: AC
  Administered 2015-11-28: 75 mg via INTRAVENOUS

## 2015-11-28 MED ORDER — TECHNETIUM TC 99M SESTAMIBI GENERIC - CARDIOLITE
10.8000 | Freq: Once | INTRAVENOUS | Status: AC | PRN
  Administered 2015-11-28: 10.8 via INTRAVENOUS

## 2015-12-01 ENCOUNTER — Telehealth: Payer: Self-pay | Admitting: Cardiovascular Disease

## 2015-12-01 NOTE — Telephone Encounter (Signed)
Forward to [facility] RN

## 2015-12-01 NOTE — Telephone Encounter (Signed)
Pt aware of results 

## 2015-12-01 NOTE — Telephone Encounter (Signed)
Returning call,she does not know who called. °

## 2015-12-10 ENCOUNTER — Other Ambulatory Visit (INDEPENDENT_AMBULATORY_CARE_PROVIDER_SITE_OTHER): Payer: Medicare Other | Admitting: *Deleted

## 2015-12-10 DIAGNOSIS — I471 Supraventricular tachycardia: Secondary | ICD-10-CM | POA: Diagnosis not present

## 2015-12-10 DIAGNOSIS — Z01812 Encounter for preprocedural laboratory examination: Secondary | ICD-10-CM

## 2015-12-10 LAB — BASIC METABOLIC PANEL
BUN: 14 mg/dL (ref 7–25)
CHLORIDE: 99 mmol/L (ref 98–110)
CO2: 25 mmol/L (ref 20–31)
Calcium: 9.2 mg/dL (ref 8.6–10.4)
Creat: 0.62 mg/dL (ref 0.50–0.99)
GLUCOSE: 89 mg/dL (ref 65–99)
POTASSIUM: 4.3 mmol/L (ref 3.5–5.3)
SODIUM: 135 mmol/L (ref 135–146)

## 2015-12-11 LAB — CBC WITH DIFFERENTIAL/PLATELET
Basophils Absolute: 0.2 10*3/uL — ABNORMAL HIGH (ref 0.0–0.1)
Basophils Relative: 1 % (ref 0–1)
EOS PCT: 3 % (ref 0–5)
Eosinophils Absolute: 0.6 10*3/uL (ref 0.0–0.7)
HEMATOCRIT: 56.3 % — AB (ref 36.0–46.0)
Hemoglobin: 17.5 g/dL — ABNORMAL HIGH (ref 12.0–15.0)
LYMPHS ABS: 2.8 10*3/uL (ref 0.7–4.0)
LYMPHS PCT: 15 % (ref 12–46)
MCH: 22.7 pg — ABNORMAL LOW (ref 26.0–34.0)
MCHC: 31.1 g/dL (ref 30.0–36.0)
MCV: 73.1 fL — AB (ref 78.0–100.0)
MONO ABS: 0.6 10*3/uL (ref 0.1–1.0)
Monocytes Relative: 3 % (ref 3–12)
Neutro Abs: 14.7 10*3/uL — ABNORMAL HIGH (ref 1.7–7.7)
Neutrophils Relative %: 78 % — ABNORMAL HIGH (ref 43–77)
Platelets: 201 10*3/uL (ref 150–400)
RBC: 7.7 MIL/uL — ABNORMAL HIGH (ref 3.87–5.11)
RDW: 26.2 % — AB (ref 11.5–15.5)
WBC: 18.9 10*3/uL — ABNORMAL HIGH (ref 4.0–10.5)

## 2015-12-16 ENCOUNTER — Encounter (HOSPITAL_COMMUNITY)
Admission: RE | Disposition: A | Discharge status: Home / Self Care | Payer: Self-pay | Source: Ambulatory Visit | Attending: Internal Medicine

## 2015-12-16 ENCOUNTER — Encounter (HOSPITAL_COMMUNITY): Payer: Self-pay | Admitting: *Deleted

## 2015-12-16 ENCOUNTER — Ambulatory Visit (HOSPITAL_COMMUNITY)
Admission: RE | Admit: 2015-12-16 | Discharge: 2015-12-16 | Disposition: A | Discharge status: Home / Self Care | Payer: Medicare Other | Source: Ambulatory Visit | Attending: Internal Medicine | Admitting: Internal Medicine

## 2015-12-16 DIAGNOSIS — I471 Supraventricular tachycardia, unspecified: Secondary | ICD-10-CM | POA: Diagnosis present

## 2015-12-16 HISTORY — PX: ELECTROPHYSIOLOGIC STUDY: SHX172A

## 2015-12-16 SURGERY — A-FLUTTER/A-TACH/SVT ABLATION
Anesthesia: LOCAL

## 2015-12-16 MED ORDER — OMEGA-3-ACID ETHYL ESTERS 1 G PO CAPS: 1.0000 g | ORAL_CAPSULE | Freq: Every day | ORAL | Status: DC

## 2015-12-16 MED ORDER — DHEA 25 MG PO CAPS: 1.0000 | ORAL_CAPSULE | ORAL | Status: DC

## 2015-12-16 MED ORDER — SODIUM CHLORIDE 0.9 % IV SOLN
1.0000 mg | INTRAVENOUS | Status: DC | PRN
  Administered 2015-12-16: 4 ug/min via INTRAVENOUS

## 2015-12-16 MED ORDER — VITAMIN D 1000 UNITS PO TABS: 1000.0000 [IU] | ORAL_TABLET | ORAL | Status: DC

## 2015-12-16 MED ORDER — VITAMIN D 1000 UNITS PO TABS: 1000.0000 [IU] | ORAL_TABLET | Freq: Every day | ORAL | Status: DC

## 2015-12-16 MED ORDER — ACETAMINOPHEN 325 MG PO TABS: 650.0000 mg | ORAL_TABLET | ORAL | Status: DC | PRN

## 2015-12-16 MED ORDER — PROGESTERONE MICRONIZED 100 MG PO CAPS
100.0000 mg | ORAL_CAPSULE | Freq: Every day | ORAL | Status: DC
  Filled 2015-12-16: qty 1

## 2015-12-16 MED ORDER — METOPROLOL SUCCINATE ER 50 MG PO TB24
50.0000 mg | ORAL_TABLET | Freq: Every day | ORAL | Status: DC
  Administered 2015-12-16: 16:00:00 50 mg via ORAL
  Filled 2015-12-16: qty 1

## 2015-12-16 MED ORDER — CALCIUM CARBONATE 1500 (600 CA) MG PO TABS
1200.0000 mg | ORAL_TABLET | Freq: Two times a day (BID) | ORAL | Status: DC
  Filled 2015-12-16 (×2): qty 1

## 2015-12-16 MED ORDER — ISOPROTERENOL HCL 0.2 MG/ML IJ SOLN
INTRAMUSCULAR | Status: AC
  Filled 2015-12-16: qty 5

## 2015-12-16 MED ORDER — HEPARIN (PORCINE) IN NACL 2-0.9 UNIT/ML-% IJ SOLN
INTRAMUSCULAR | Status: AC
  Filled 2015-12-16: qty 500

## 2015-12-16 MED ORDER — MIDAZOLAM HCL 5 MG/5ML IJ SOLN
INTRAMUSCULAR | Status: AC
  Filled 2015-12-16: qty 5

## 2015-12-16 MED ORDER — MIDAZOLAM HCL 5 MG/5ML IJ SOLN
INTRAMUSCULAR | Status: DC | PRN
  Administered 2015-12-16 (×4): 1 mg via INTRAVENOUS
  Administered 2015-12-16: 2 mg via INTRAVENOUS
  Administered 2015-12-16 (×3): 1 mg via INTRAVENOUS

## 2015-12-16 MED ORDER — SODIUM CHLORIDE 0.9 % IJ SOLN: 3.0000 mL | INTRAMUSCULAR | Status: DC | PRN

## 2015-12-16 MED ORDER — OFF THE BEAT BOOK
Freq: Once | Status: AC
  Administered 2015-12-16: 16:00:00
  Filled 2015-12-16: qty 1

## 2015-12-16 MED ORDER — FISH OIL + D3 1000-1000 MG-UNIT PO CAPS: 1000.0000 mg | ORAL_CAPSULE | Freq: Every day | ORAL | Status: DC

## 2015-12-16 MED ORDER — ONDANSETRON HCL 4 MG/2ML IJ SOLN: 4.0000 mg | Freq: Four times a day (QID) | INTRAMUSCULAR | Status: DC | PRN

## 2015-12-16 MED ORDER — FENTANYL CITRATE (PF) 100 MCG/2ML IJ SOLN
INTRAMUSCULAR | Status: AC
  Filled 2015-12-16: qty 2

## 2015-12-16 MED ORDER — THYROID 60 MG PO TABS: 60.0000 mg | ORAL_TABLET | Freq: Every day | ORAL | Status: DC

## 2015-12-16 MED ORDER — BUPIVACAINE HCL (PF) 0.25 % IJ SOLN
INTRAMUSCULAR | Status: DC | PRN
  Administered 2015-12-16: 31 mL

## 2015-12-16 MED ORDER — VITAMIN B-12 1000 MCG PO TABS
1000.0000 ug | ORAL_TABLET | Freq: Every day | ORAL | Status: DC
  Filled 2015-12-16: qty 1

## 2015-12-16 MED ORDER — SODIUM CHLORIDE 0.9 % IJ SOLN: 3.0000 mL | Freq: Two times a day (BID) | INTRAMUSCULAR | Status: DC

## 2015-12-16 MED ORDER — HYDROXYUREA 500 MG PO CAPS: 500.0000 mg | ORAL_CAPSULE | Freq: Every day | ORAL | Status: DC

## 2015-12-16 MED ORDER — BUPIVACAINE HCL (PF) 0.25 % IJ SOLN
INTRAMUSCULAR | Status: AC
  Filled 2015-12-16: qty 60

## 2015-12-16 MED ORDER — HEPARIN (PORCINE) IN NACL 2-0.9 UNIT/ML-% IJ SOLN
INTRAMUSCULAR | Status: DC | PRN
  Administered 2015-12-16: 12:00:00

## 2015-12-16 MED ORDER — FOLIC ACID 1 MG PO TABS: 1.0000 mg | ORAL_TABLET | Freq: Every day | ORAL | Status: DC

## 2015-12-16 MED ORDER — FENTANYL CITRATE (PF) 100 MCG/2ML IJ SOLN
INTRAMUSCULAR | Status: DC | PRN
  Administered 2015-12-16 (×3): 12.5 ug via INTRAVENOUS
  Administered 2015-12-16: 25 ug via INTRAVENOUS
  Administered 2015-12-16: 12.5 ug via INTRAVENOUS
  Administered 2015-12-16: 25 ug via INTRAVENOUS

## 2015-12-16 MED ORDER — SODIUM CHLORIDE 0.9 % IV SOLN: 250.0000 mL | INTRAVENOUS | Status: DC | PRN

## 2015-12-16 SURGICAL SUPPLY — 11 items
BAG SNAP BAND KOVER 36X36 (MISCELLANEOUS) ×2 IMPLANT
CATH HEX JOSEPH 2-5-2 65CM 6F (CATHETERS) ×2 IMPLANT
CATH JOSEPHSON QUAD-ALLRED 6FR (CATHETERS) ×4 IMPLANT
CATH THERMISTOR 7FR 4MM (ABLATOR) ×2 IMPLANT
PACK EP LATEX FREE (CUSTOM PROCEDURE TRAY) ×1
PACK EP LF (CUSTOM PROCEDURE TRAY) ×1 IMPLANT
PAD DEFIB LIFELINK (PAD) ×2 IMPLANT
SHEATH PINNACLE 6F 10CM (SHEATH) ×4 IMPLANT
SHEATH PINNACLE 7F 10CM (SHEATH) ×2 IMPLANT
SHEATH PINNACLE 8F 10CM (SHEATH) ×2 IMPLANT
SHIELD RADPAD SCOOP 12X17 (MISCELLANEOUS) ×2 IMPLANT

## 2015-12-16 NOTE — Interval H&P Note (Signed)
History and Physical Interval Note:  12/16/2015 12:08 PM  Priscilla Berry  has presented today for surgery, with the diagnosis of svt  The various methods of treatment have been discussed with the patient and family. After consideration of risks, benefits and other options for treatment, the patient has consented to  Procedure(s): SVT Ablation (N/A) as a surgical intervention .  The patient's history has been reviewed, patient examined, no change in status, stable for surgery.  I have reviewed the patient's chart and labs.  Questions were answered to the patient's satisfaction.     Cristopher Peru

## 2015-12-16 NOTE — H&P (View-Only) (Signed)
HPI Priscilla Berry is referred by Dr. Gwenlyn Found for consideration of catheter ablation of SVT. She is a pleasant 69 yo woman with palpitations dating back 20-30 years. She did not intially seek medical attention. Over the past year she has had increasingly frequent episodes and was found to have a narrow QRS tachycardia at over 200/min. She has been treated with beta blockers which have improved her symptoms. She however has had trouble taking these medications and wants to stop her beta blocker. With her SVT, she has near syncope, chest pain and sob.  Allergies  Allergen Reactions  . Caffeine      Current Outpatient Prescriptions  Medication Sig Dispense Refill  . ASHWAGANDHA PO Take 1 tablet by mouth.    . calcium carbonate (OSCAL) 1500 (600 CA) MG TABS tablet Take 1,200 mg by mouth 2 (two) times daily with a meal.    . Calcium Carbonate-Vitamin D (CALCIUM-VITAMIN D) 500-200 MG-UNIT per tablet Take 1 tablet by mouth 3 (three) times a week.    . Cyanocobalamin (RA VITAMIN B-12 TR) 1000 MCG TBCR Take by mouth.    Marland Kitchen DHEA 25 MG CAPS Take 1 capsule by mouth 2 (two) times a week.    . Fish Oil-Cholecalciferol (FISH OIL + D3) 1000-1000 MG-UNIT CAPS Take by mouth 1 day or 1 dose.    . folic acid (FOLVITE) Q000111Q MCG tablet Take by mouth.    Marland Kitchen MAGNESIUM CITRATE PO Take 200 mg by mouth daily.    . metoprolol succinate (TOPROL-XL) 50 MG 24 hr tablet Take 1 tablet (50 mg total) by mouth daily. Take with or immediately following a meal. 30 tablet 0  . Probiotic Product (PROBIOTIC DAILY PO) Take 1 tablet by mouth daily.    . progesterone (PROMETRIUM) 100 MG capsule Take 100 mg by mouth daily.     No current facility-administered medications for this visit.     Past Medical History  Diagnosis Date  . Hyperthyroidism   . Polio   . PSVT (paroxysmal supraventricular tachycardia) (HCC)     ROS:   All systems reviewed and negative except as noted in the HPI.   Past Surgical History  Procedure  Laterality Date  . Breast surgery      breast biopsies  . Tonsillectomy    . Colonoscopy with propofol N/A 07/04/2015    Procedure: COLONOSCOPY WITH PROPOFOL;  Surgeon: Manya Silvas, MD;  Location: [facility] ENDOSCOPY;  Service: Endoscopy;  Laterality: N/A;  . Breast biopsy Right 1999    EXCISIONAL - NEG  . Breast biopsy Right 2012    EXCISIONAL - NEG     Family History  Problem Relation Age of Onset  . Breast cancer Neg Hx      Social History   Social History  . Marital Status: Married    Spouse Name: N/A  . Number of Children: N/A  . Years of Education: N/A   Occupational History  . Not on file.   Social History Main Topics  . Smoking status: Never Smoker   . Smokeless tobacco: Not on file  . Alcohol Use: No  . Drug Use: Not on file  . Sexual Activity: Not on file   Other Topics Concern  . Not on file   Social History Narrative     BP 118/78 mmHg  Pulse 81  Ht 5\' 7"  (1.702 m)  Wt 137 lb 6.4 oz (62.324 kg)  BMI 21.51 kg/m2  Physical Exam:  Well appearing 69  yo man,  NAD HEENT: Unremarkable Neck:  6 cm JVD, no thyromegally Lymphatics:  No adenopathy Back:  No CVA tenderness Lungs:  Clear with no wheezes HEART:  Regular rate rhythm, no murmurs, no rubs, no clicks Abd:  soft, positive bowel sounds, no organomegally, no rebound, no guarding Ext:  2 plus pulses, no edema, no cyanosis, no clubbing Skin:  No rashes no nodules Neuro:  CN II through XII intact, motor grossly intact  EKG - nsr with no pre-excitation   Assess/Plan:

## 2015-12-16 NOTE — Progress Notes (Signed)
Patient has been having an irregular heart rhythm with PAC's noted. Tommye Standard PA informed. No SVT's noted. Patient has voiced no complaints and no distress noted. Groin and IJ site dressings remain C/D/I with no bleeding or hematoma noted. Husband remains at bedside.

## 2015-12-16 NOTE — Progress Notes (Signed)
Pt with order to discharge today. Discharge instructions, follow-up appointments, daily meds instructions provided. IV removed, Pt walked approximately 0000000 ft without complications. Right IJ & right femoral site noted no hematoma, no bleeding & site clean, dry & intact. Meds & site care education provided. Discharged without complications. Temp=98.4;BP=113/65; HR=89; R=24; O2sats=93 to 95% on room air.

## 2015-12-16 NOTE — Discharge Instructions (Signed)
No driving for 4 days. No lifting over 5 lbs for 1 week. No sexual activity for 1 week. You may return to work on 12/22/14. Keep procedure site clean & dry. If you notice increased pain, swelling, bleeding or pus, call/return!  You may shower, but no soaking baths/hot tubs/pools for 1 week.

## 2015-12-16 NOTE — Progress Notes (Signed)
PHARMACIST - PHYSICIAN ORDER COMMUNICATION  CONCERNING: P&T Medication Policy on Herbal Medications  DESCRIPTION:  This patient's order for:  DHEA  has been noted.  This product(s) is classified as an "herbal" or natural product. Due to a lack of definitive safety studies or FDA approval, nonstandard manufacturing practices, plus the potential risk of unknown drug-drug interactions while on inpatient medications, the Pharmacy and Therapeutics Committee does not permit the use of "herbal" or natural products of this type within [facility].   ACTION TAKEN: The pharmacy department is unable to verify this order at this time and your patient has been informed of this safety policy. Please reevaluate patient's clinical condition at discharge and address if the herbal or natural product(s) should be resumed at that time.   Gracy Bruins, PharmD Clinical Pharmacist [facility]

## 2015-12-16 NOTE — Progress Notes (Signed)
Site area: RFV x3/RT IJ X1 Site Prior to Removal:  Level 0/0 Pressure Applied For:44min/5min Manual:   yes Patient Status During Pull:  stable Post Pull Site:  Level 0/0 Post Pull Instructions Given:  yes Post Pull Pulses Present:  Dressing Applied:  clear Bedrest begins @ 1430 till 2030 Comments:

## 2015-12-16 NOTE — Progress Notes (Signed)
CBC values reviewed with Anderson Malta RN/ cath lab, she will discuss with Dr Lovena Le.

## 2015-12-17 ENCOUNTER — Ambulatory Visit: Payer: Medicare Other | Admitting: Oncology

## 2015-12-17 ENCOUNTER — Encounter (HOSPITAL_COMMUNITY): Payer: Self-pay | Admitting: Internal Medicine

## 2015-12-17 ENCOUNTER — Other Ambulatory Visit: Payer: Medicare Other

## 2015-12-18 ENCOUNTER — Telehealth: Payer: Self-pay | Admitting: *Deleted

## 2015-12-18 MED ORDER — METOPROLOL TARTRATE 50 MG PO TABS: 25.0000 mg | ORAL_TABLET | Freq: Every day | ORAL | Status: DC

## 2015-12-18 NOTE — Telephone Encounter (Signed)
called regarding metoprolol and a refill for it, was giving at the ED. Explained to pt (see below) and she expressed understanding, but she still wants to know if she should be on this. Will route to [employer]   Medication   metoprolol succinate (TOPROL-XL) 50 MG 24 hr tablet [30070]       metoprolol succinate (TOPROL-XL) 50 MG 24 hr tablet [id number] DISCONTINUED     Order Details    Dose: 50 mg Route: Oral Frequency: Daily   Dispense Quantity:  30 tablet Refills:  0 Fills Remaining:  0          Sig: Take 1 tablet (50 mg total) by mouth daily. Take with or immediately following a meal.         Discontinue Date:  12/16/2015 2107 Discontinue User:  Baldwin Jamaica, PA-C Discontinue Reason:  Stop Taking at Discharge   Written Date:  11/09/15 Expiration Date:  11/08/16     Start Date:  11/09/15 End Date:  12/16/15     Ordering Provider:  Lisa Roca, MD Authorizing Provider:  Lisa Roca, MD Ordering User:  Lisa Roca, MD                    Pharmacy:  [facility], [place] Comments:  --         Quantity Remaining:  0 tablet Quantity Filled:  0 tablet

## 2015-12-18 NOTE — Telephone Encounter (Signed)
Discussed with Tommye Standard, PA.  She has follow up on 12/29/15.  We will have her take Metoprolol 25mg  daily until then as she is feeling a little nervous about not taking.  This was discontinued at discharge after SVT ablation due to patient stating it makes her feel terrible.  Only giving her 10 pills

## 2015-12-26 ENCOUNTER — Ambulatory Visit (INDEPENDENT_AMBULATORY_CARE_PROVIDER_SITE_OTHER): Payer: Medicare Other | Admitting: Nurse Practitioner

## 2015-12-26 ENCOUNTER — Encounter: Payer: Self-pay | Admitting: Nurse Practitioner

## 2015-12-26 VITALS — BP 110/70 | HR 66 | Ht 67.0 in | Wt 139.2 lb

## 2015-12-26 DIAGNOSIS — I471 Supraventricular tachycardia: Secondary | ICD-10-CM | POA: Diagnosis not present

## 2015-12-26 NOTE — Patient Instructions (Signed)
Medication Instructions:   STOP METOPROLOL  If you need a refill on your cardiac medications before your next appointment, please call your pharmacy.  Labwork: NONE ORDER TODAY    Testing/Procedures:  NONE ORDER TODAY    Follow-Up: AS NEEDED   Any Other Special Instructions Will Be Listed Below (If Applicable).

## 2015-12-26 NOTE — Progress Notes (Signed)
Electrophysiology Office Note Date: 12/26/2015  ID:  Priscilla, Berry 03/10/1946, MRN [id number]  PCP: Idelle Crouch, MD Primary Cardiologist: Gwenlyn Found Electrophysiologist: Lovena Le  CC: post ablation follow up  Priscilla Berry is a 70 y.o. female seen today for Dr Lovena Le.  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  Since discharge, the patient reports doing very well.   She has had no recurrent SVT and no groin complications. She denies chest pain, palpitations, dyspnea, PND, orthopnea, nausea, vomiting, dizziness, syncope, edema, weight gain, or early satiety.  Past Medical History  Diagnosis Date  . Hyperthyroidism   . Polio   . PSVT (paroxysmal supraventricular tachycardia) [facility])    Past Surgical History  Procedure Laterality Date  . Breast surgery      breast biopsies  . Tonsillectomy    . Colonoscopy with propofol N/A 07/04/2015    Procedure: COLONOSCOPY WITH PROPOFOL;  Surgeon: Manya Silvas, MD;  Location: [facility] ENDOSCOPY;  Service: Endoscopy;  Laterality: N/A;  . Breast biopsy Right 1999    EXCISIONAL - NEG  . Breast biopsy Right 2012    EXCISIONAL - NEG  . Electrophysiologic study N/A 12/16/2015    Procedure: SVT Ablation;  Surgeon: Evans Lance, MD;  Location: [place] CV LAB;  Service: Cardiovascular;  Laterality: N/A;    Current Outpatient Prescriptions  Medication Sig Dispense Refill  . ARMOUR THYROID 60 MG tablet Take 60 mg by mouth daily.  4  . calcium carbonate (OSCAL) 1500 (600 CA) MG TABS tablet Take 1,200 mg by mouth 2 (two) times daily with a meal.    . cholecalciferol (VITAMIN D) 1000 UNITS tablet Take 1,000 Units by mouth 3 (three) times a week.    . Cyanocobalamin (RA VITAMIN B-12 TR) 1000 MCG TBCR Take 1,000 mcg by mouth daily.     Marland Kitchen DHEA 25 MG CAPS Take 1 capsule by mouth 2 (two) times a week.    . Fish Oil-Cholecalciferol (FISH OIL + D3) 1000-1000 MG-UNIT CAPS Take 1,000  mg by mouth daily.     . folic acid (FOLVITE) Q000111Q MCG tablet Take 800 mcg by mouth daily.     . hydroxyurea (HYDREA) 500 MG capsule Take 500 mg by mouth daily.  2  . MAGNESIUM CITRATE PO Take 200 mg by mouth daily.    . metoprolol (LOPRESSOR) 50 MG tablet Take 0.5 tablets (25 mg total) by mouth daily. 10 tablet 0  . Probiotic Product (PROBIOTIC DAILY PO) Take 1 tablet by mouth daily.    . progesterone (PROMETRIUM) 100 MG capsule Take 100 mg by mouth daily.     No current facility-administered medications for this visit.    Allergies:   Bee venom and Caffeine   Social History: Social History   Social History  . Marital Status: Married    Spouse Name: N/A  . Number of Children: N/A  . Years of Education: N/A   Occupational History  . Not on file.   Social History Main Topics  . Smoking status: Never Smoker   . Smokeless tobacco: Not on file  . Alcohol Use: No  . Drug Use: Not on file  . Sexual Activity: Not on file   Other Topics Concern  . Not on file   Social History Narrative    Family History: Family History  Problem Relation Age of Onset  . Breast cancer Neg Hx     Review of Systems: All  other systems reviewed and are otherwise negative except as noted above.   Physical Exam: VS:  BP 110/70 mmHg  Pulse 66  Ht 5\' 7"  (1.702 m)  Wt 139 lb 3.2 oz (63.141 kg)  BMI 21.80 kg/m2 , BMI Body mass index is 21.8 kg/(m^2). Wt Readings from Last 3 Encounters:  12/26/15 139 lb 3.2 oz (63.141 kg)  12/16/15 136 lb (61.689 kg)  11/28/15 139 lb (63.05 kg)    GEN- The patient is well appearing, alert and oriented x 3 today.   HEENT: normocephalic, atraumatic; sclera clear, conjunctiva pink; hearing intact; oropharynx clear; neck supple  Lungs- Clear to ausculation bilaterally, normal work of breathing.  No wheezes, rales, rhonchi Heart- Regular rate and rhythm  GI- soft, non-tender, non-distended, bowel sounds present  Extremities- no clubbing, cyanosis, or edema  MS-  no significant deformity or atrophy Skin- warm and dry, no rash or lesion  Psych- euthymic mood, full affect Neuro- strength and sensation are intact   EKG:  EKG is ordered today. The ekg ordered today shows sinus rhythm, rate 66, iRBBB  Recent Labs: 11/19/2015: TSH 12.233* 12/10/2015: BUN 14; Creat 0.62; Hemoglobin 17.5*; Platelets 201; Potassium 4.3; Sodium 135    Other studies Reviewed: Additional studies/ records that were reviewed today include: Dr Tanna Furry notes, hospital notes  Assessment and Plan: 1.  AVNRT Doing well post ablation Discontinue Metoprolol Follow up with EP as needed   Current medicines are reviewed at length with the patient today.   The patient does not have concerns regarding her medicines.  The following changes were made today:  Discontinue metoprolol  Labs/ tests ordered today include: none   Disposition:   Follow up with EP as needed     Signed, Chanetta Marshall, NP 12/26/2015 11:30 AM   [facility] [address] [place] [address] [phone] (office) [phone] (fax)

## 2015-12-29 ENCOUNTER — Ambulatory Visit: Payer: Medicare Other | Admitting: Physician Assistant

## 2016-02-04 ENCOUNTER — Ambulatory Visit: Payer: Medicare Other | Admitting: Cardiovascular Disease

## 2016-02-25 ENCOUNTER — Inpatient Hospital Stay: Payer: Medicare Other

## 2016-02-25 ENCOUNTER — Inpatient Hospital Stay: Payer: Medicare Other | Attending: Oncology | Admitting: Oncology

## 2016-02-25 VITALS — BP 149/77 | HR 85 | Temp 98.4°F | Wt 137.8 lb

## 2016-02-25 DIAGNOSIS — D72829 Elevated white blood cell count, unspecified: Secondary | ICD-10-CM

## 2016-02-25 DIAGNOSIS — E039 Hypothyroidism, unspecified: Secondary | ICD-10-CM

## 2016-02-25 DIAGNOSIS — Z8612 Personal history of poliomyelitis: Secondary | ICD-10-CM | POA: Diagnosis not present

## 2016-02-25 DIAGNOSIS — D45 Polycythemia vera: Secondary | ICD-10-CM

## 2016-02-25 DIAGNOSIS — D509 Iron deficiency anemia, unspecified: Secondary | ICD-10-CM | POA: Diagnosis not present

## 2016-02-25 DIAGNOSIS — Z79899 Other long term (current) drug therapy: Secondary | ICD-10-CM

## 2016-02-25 DIAGNOSIS — I471 Supraventricular tachycardia: Secondary | ICD-10-CM | POA: Diagnosis not present

## 2016-02-25 LAB — CBC WITH DIFFERENTIAL/PLATELET
Basophils Absolute: 0.2 10*3/uL — ABNORMAL HIGH (ref 0–0.1)
Basophils Relative: 1 %
EOS ABS: 0.5 10*3/uL (ref 0–0.7)
Eosinophils Relative: 3 %
HCT: 57.2 % — ABNORMAL HIGH (ref 35.0–47.0)
HEMOGLOBIN: 18.2 g/dL — AB (ref 12.0–16.0)
LYMPHS ABS: 1.9 10*3/uL (ref 1.0–3.6)
Lymphocytes Relative: 12 %
MCH: 25.4 pg — AB (ref 26.0–34.0)
MCHC: 31.7 g/dL — AB (ref 32.0–36.0)
MCV: 80 fL (ref 80.0–100.0)
MONO ABS: 0.6 10*3/uL (ref 0.2–0.9)
Neutro Abs: 13.4 10*3/uL — ABNORMAL HIGH (ref 1.4–6.5)
Platelets: 156 10*3/uL (ref 150–440)
RBC: 7.15 MIL/uL — ABNORMAL HIGH (ref 3.80–5.20)
RDW: 33.6 % — AB (ref 11.5–14.5)
WBC: 16.6 10*3/uL — ABNORMAL HIGH (ref 3.6–11.0)

## 2016-02-25 NOTE — Progress Notes (Signed)
Has been taking the Hydrea for 6 weeks.

## 2016-03-05 NOTE — Progress Notes (Signed)
[place]  [phone] [phone]) [phone]  ID: Jeryl Columbia OB: March 10, 1946  MR#: [id number]  NOM#:[id number]  Patient Care Team: Idelle Crouch, MD as PCP - General (Internal Medicine)  CHIEF COMPLAINT:  Chief Complaint  Patient presents with  . polycythemia    INTERVAL HISTORY: Patient returns to clinic today for further evaluation and laboratory work. She Only recently started Hydrea and is tolerating it well. She currently feels well and is asymptomatic. She has no neurologic complaints. She denies any fevers, night sweats, or weight loss. She has no chest pain or shortness of breath. She denies any nausea, vomiting, constipation, or diarrhea. She has no urinary complaints. Patient feels at her baseline and offers no specific complaints today.  REVIEW OF SYSTEMS:   Review of Systems  Constitutional: Negative for fever, weight loss and malaise/fatigue.  Respiratory: Negative.   Cardiovascular: Negative.   Gastrointestinal: Negative.   Genitourinary: Negative.   Musculoskeletal: Negative.   Neurological: Negative.  Negative for weakness.  Endo/Heme/Allergies: Does not bruise/bleed easily.    As per HPI. Otherwise, a complete review of systems is negatve.  PAST MEDICAL HISTORY: Past Medical History  Diagnosis Date  . Hyperthyroidism   . Polio   . PSVT (paroxysmal supraventricular tachycardia) ([place])     PAST SURGICAL HISTORY: Past Surgical History  Procedure Laterality Date  . Breast surgery      breast biopsies  . Tonsillectomy    . Colonoscopy with propofol N/A 07/04/2015    Procedure: COLONOSCOPY WITH PROPOFOL;  Surgeon: Manya Silvas, MD;  Location: [facility] ENDOSCOPY;  Service: Endoscopy;  Laterality: N/A;  . Breast biopsy Right 1999    EXCISIONAL - NEG  . Breast biopsy Right 2012    EXCISIONAL - NEG  . Electrophysiologic study N/A 12/16/2015    Procedure: SVT Ablation;  Surgeon: Evans Lance, MD;  Location: [place]  CV LAB;  Service: Cardiovascular;  Laterality: N/A;    FAMILY HISTORY: Reviewed and unchanged. No reported history of malignancy or chronic disease.     ADVANCED DIRECTIVES:    HEALTH MAINTENANCE: Social History  Substance Use Topics  . Smoking status: Never Smoker   . Smokeless tobacco: Not on file  . Alcohol Use: No     Colonoscopy:  PAP:  Bone density:  Lipid panel:  Allergies  Allergen Reactions  . Bee Venom Anaphylaxis  . Caffeine     Fever, chills, withdrawal symptoms    Current Outpatient Prescriptions  Medication Sig Dispense Refill  . ARMOUR THYROID 60 MG tablet Take 60 mg by mouth daily.  4  . calcium carbonate (OSCAL) 1500 (600 CA) MG TABS tablet Take 1,200 mg by mouth 2 (two) times daily with a meal.    . cholecalciferol (VITAMIN D) 1000 UNITS tablet Take 1,000 Units by mouth 3 (three) times a week.    . Cyanocobalamin (RA VITAMIN B-12 TR) 1000 MCG TBCR Take 1,000 mcg by mouth daily.     Marland Kitchen DHEA 25 MG CAPS Take 1 capsule by mouth 2 (two) times a week.    . Fish Oil-Cholecalciferol (FISH OIL + D3) 1000-1000 MG-UNIT CAPS Take 1,000 mg by mouth daily.     . folic acid (FOLVITE) 962 MCG tablet Take 800 mcg by mouth daily.     . hydroxyurea (HYDREA) 500 MG capsule Take 500 mg by mouth daily.  2  . MAGNESIUM CITRATE PO Take 200 mg by mouth daily.    . metoprolol (LOPRESSOR) 50 MG tablet Take  0.5 tablets (25 mg total) by mouth daily. 10 tablet 0  . Probiotic Product (PROBIOTIC DAILY PO) Take 1 tablet by mouth daily.    . progesterone (PROMETRIUM) 100 MG capsule Take 100 mg by mouth daily.     No current facility-administered medications for this visit.    OBJECTIVE: Filed Vitals:   02/25/16 1040  BP: 149/77  Pulse: 85  Temp: 98.4 F (36.9 C)     Body mass index is 21.58 kg/(m^2).    ECOG FS:0 - Asymptomatic  General: Well-developed, well-nourished, no acute distress. Eyes: Pink conjunctiva, anicteric sclera. Lungs: Clear to auscultation  bilaterally. Heart: Regular rate and rhythm. No rubs, murmurs, or gallops. Abdomen: Soft, nontender, nondistended. No organomegaly noted, normoactive bowel sounds. Musculoskeletal: No edema, cyanosis, or clubbing. Neuro: Alert, answering all questions appropriately. Cranial nerves grossly intact. Skin: No rashes or petechiae noted. Psych: Normal affect.    LAB RESULTS:  Lab Results  Component Value Date   NA 135 12/10/2015   K 4.3 12/10/2015   CL 99 12/10/2015   CO2 25 12/10/2015   GLUCOSE 89 12/10/2015   BUN 14 12/10/2015   CREATININE 0.62 12/10/2015   CALCIUM 9.2 12/10/2015   GFRNONAA >60 11/19/2015   GFRAA >60 11/19/2015    Lab Results  Component Value Date   WBC 16.6* 02/25/2016   NEUTROABS 13.4* 02/25/2016   HGB 18.2* 02/25/2016   HCT 57.2* 02/25/2016   MCV 80.0 02/25/2016   PLT 156 02/25/2016   Lab Results  Component Value Date   IRON 27* 10/22/2015   TIBC 420 10/22/2015   IRONPCTSAT 6* 10/22/2015    Lab Results  Component Value Date   FERRITIN 15 10/22/2015     STUDIES: No results found.  ASSESSMENT: JAK-2 positive, polycythemia vera, iron deficiency, heterozygous for hemochromatosis gene mutation H63D   PLAN:    1. Polycythemia vera: JAK-2 mutation is positive. She does not require phlebotomy given her significant iron deficiency. Patient only recently started 566m Hydrea daily.  Patient has a combination of polycythemia vera as well as iron deficiency. Return to clinic in 4 weeks for repeat laboratory work and further evaluation, if her hemoglobin continues to remain significantly elevated at that time, we will then consider phlebotomy.. 2. Leukocytosis: Likely related to P. vera.  BCR/ABL was negative for underlying CML. Her peripheral blood flow cytometry did reveal approximately 1% myeloblasts.  Monitor.  Patient may need a bone marrow biopsy in the future for further evaluation., 3. Hemochromatosis: Patient is likely an unaffected carrier and is  iron deficient, likely from her P. vera. No phlebotomy is needed.  Patient expressed understanding and was in agreement with this plan. She also understands that She can call clinic at any time with any questions, concerns, or complaints.   TLloyd Huger MD   03/05/2016 3:55 PM

## 2016-03-17 ENCOUNTER — Ambulatory Visit: Payer: Medicare Other | Admitting: Internal Medicine

## 2016-03-19 ENCOUNTER — Encounter: Payer: Medicare Other | Attending: Physical Medicine & Rehabilitation | Admitting: Physical Medicine & Rehabilitation

## 2016-03-19 ENCOUNTER — Encounter: Payer: Self-pay | Admitting: Physical Medicine & Rehabilitation

## 2016-03-19 VITALS — BP 137/88 | HR 96

## 2016-03-19 DIAGNOSIS — R269 Unspecified abnormalities of gait and mobility: Secondary | ICD-10-CM | POA: Insufficient documentation

## 2016-03-19 DIAGNOSIS — R531 Weakness: Secondary | ICD-10-CM | POA: Diagnosis not present

## 2016-03-19 DIAGNOSIS — E0590 Thyrotoxicosis, unspecified without thyrotoxic crisis or storm: Secondary | ICD-10-CM | POA: Insufficient documentation

## 2016-03-19 DIAGNOSIS — A6920 Lyme disease, unspecified: Secondary | ICD-10-CM | POA: Diagnosis not present

## 2016-03-19 DIAGNOSIS — G14 Postpolio syndrome: Secondary | ICD-10-CM | POA: Diagnosis present

## 2016-03-19 DIAGNOSIS — G43909 Migraine, unspecified, not intractable, without status migrainosus: Secondary | ICD-10-CM | POA: Diagnosis not present

## 2016-03-19 DIAGNOSIS — E039 Hypothyroidism, unspecified: Secondary | ICD-10-CM | POA: Diagnosis not present

## 2016-03-19 DIAGNOSIS — I471 Supraventricular tachycardia: Secondary | ICD-10-CM | POA: Insufficient documentation

## 2016-03-19 HISTORY — DX: Unspecified abnormalities of gait and mobility: R26.9

## 2016-03-19 NOTE — Progress Notes (Signed)
Subjective:    Patient ID: Priscilla Berry, female    DOB: 1946/05/11, 70 y.o.   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 She has had polio since age 42. She is a Corporate treasurer by profession.  Her headaches and under control. The lymes disease was treated.   She notes that she has fallen nearly her entire life.  Recently, in February she fell without her braces.  She has weakness in b/l LE R>L.  She had associated back pain and went to a chiropractor, who adjusted her back.   She wears b/l AFOs for mobility.  She is also using a cane now.  She has a very active lifestyle and is concerned about falls.  Since using singlecane/walker she has not fallen (~2 months).  Prior to this she used to fall once every 2 months.  She denies pain.   Pain Inventory Average Pain 0 Pain Right Now 0 My pain is no pain  In the last 24 hours, has pain interfered with the following? General activity 0 Relation with others 0 Enjoyment of life 0 What TIME of day is your pain at its worst? no pain Sleep (in general) Fair  Pain is worse with: no pain Pain improves with: no pain Relief from Meds: no pain  Mobility walk with assistance use a cane use a walker how many minutes can you walk? several hours with braces ability to climb steps?  no do you drive?  yes  Function retired  Neuro/Psych trouble walking  Prior Studies Any changes since last visit?  no  Physicians involved in your care Any changes since last visit?  no   Family History  Problem Relation Age of Onset  . Breast cancer Neg Hx    Social History   Social History  . Marital Status: Married    Spouse Name: N/A  . Number of Children: N/A  . Years of Education: N/A   Social History Main Topics  . Smoking status: Never Smoker   . Smokeless tobacco: None  . Alcohol Use: No  .  Drug Use: None  . Sexual Activity: Not Asked   Other Topics Concern  . None   Social History Narrative   Past Surgical History  Procedure Laterality Date  . Breast surgery      breast biopsies  . Tonsillectomy    . Colonoscopy with propofol N/A 07/04/2015    Procedure: COLONOSCOPY WITH PROPOFOL;  Surgeon: Manya Silvas, MD;  Location: [facility] ENDOSCOPY;  Service: Endoscopy;  Laterality: N/A;  . Breast biopsy Right 1999    EXCISIONAL - NEG  . Breast biopsy Right 2012    EXCISIONAL - NEG  . Electrophysiologic study N/A 12/16/2015    Procedure: SVT Ablation;  Surgeon: Evans Lance, MD;  Location: [place] [facility];  Service: Cardiovascular;  Laterality: N/A;   Past Medical History  Diagnosis Date  . Hyperthyroidism   . Polio   . PSVT (paroxysmal supraventricular tachycardia) (HCC)    BP 137/88 mmHg  Pulse 96  SpO2 96%  Opioid Risk Score:   Fall Risk Score:  `1  Depression screen PHQ 2/9  Depression screen PHQ 2/9 03/19/2016  Decreased Interest 0  Down, Depressed, Hopeless 0  PHQ - 2 Score 0  Altered sleeping 1  Tired, decreased energy 1  Change in appetite 0  Feeling bad or  failure about yourself  0  Trouble concentrating 0  Moving slowly or fidgety/restless 0  Suicidal thoughts 0  PHQ-9 Score 2  Difficult doing work/chores Not difficult at all     Review of Systems  All other systems reviewed and are negative.      Objective:   Physical Exam HENT: Normocephalic, Atraumatic Eyes: EOMI, Conj WNL Cardio: S1, S2 normal, RRR Pulm: B/l clear to auscultation.  Effort normal Abd: Soft, non-distended, non-tender, BS+ MSK:  Gait slowed with b/l LE external rotation with AFOs, foot slaps without AFOs   No TTP.    No edema.  Neuro: CN II-XII grossly intact.    Sensation intact to light touch in all UE and LE dermatomes  Reflexes 2+ throughout, except 1+ RLE  Strength  5/5 in all UE myotomes    4+/5 left hip flexion, 5/5 knee extension, 1/5 ankle  dorsi/plantar flexion    4/5 right hip flexion, 3+/5 knee extension, 1/5 ankle dorsi/plantar flexion Skin: Warm and Dry.  Healing abrasion on right knee.     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  1. Post-polio syndrome  She denies pain  She has had current AFOs for 15-20 years, but states she is comfortable with them  She states that since using cane/walker she has not had a fall and feels comfortable  Educated pt on home safety (rails, rugs, carpets)  Cont current AFOs with cane/walker, will consider replacement AFOs with ?right KFO if necessary along with change in cane and walker.    Encourage pt to call to make an appointment if gait stability changes.   2. Abnormality of gait  See #1  Time spent with pt: 50 min, time spent counseling 35 min.

## 2016-04-07 ENCOUNTER — Inpatient Hospital Stay (HOSPITAL_BASED_OUTPATIENT_CLINIC_OR_DEPARTMENT_OTHER): Payer: Medicare Other | Admitting: Oncology

## 2016-04-07 ENCOUNTER — Inpatient Hospital Stay: Payer: Medicare Other

## 2016-04-07 ENCOUNTER — Inpatient Hospital Stay: Payer: Medicare Other | Attending: Oncology

## 2016-04-07 VITALS — BP 133/79 | HR 82 | Temp 97.9°F | Resp 16 | Wt 139.3 lb

## 2016-04-07 DIAGNOSIS — R51 Headache: Secondary | ICD-10-CM | POA: Insufficient documentation

## 2016-04-07 DIAGNOSIS — D45 Polycythemia vera: Secondary | ICD-10-CM | POA: Diagnosis present

## 2016-04-07 DIAGNOSIS — I471 Supraventricular tachycardia: Secondary | ICD-10-CM | POA: Insufficient documentation

## 2016-04-07 DIAGNOSIS — D509 Iron deficiency anemia, unspecified: Secondary | ICD-10-CM | POA: Diagnosis not present

## 2016-04-07 DIAGNOSIS — R531 Weakness: Secondary | ICD-10-CM | POA: Insufficient documentation

## 2016-04-07 DIAGNOSIS — Z8612 Personal history of poliomyelitis: Secondary | ICD-10-CM

## 2016-04-07 DIAGNOSIS — E0590 Thyrotoxicosis, unspecified without thyrotoxic crisis or storm: Secondary | ICD-10-CM

## 2016-04-07 DIAGNOSIS — Z79899 Other long term (current) drug therapy: Secondary | ICD-10-CM | POA: Diagnosis not present

## 2016-04-07 DIAGNOSIS — R269 Unspecified abnormalities of gait and mobility: Secondary | ICD-10-CM

## 2016-04-07 DIAGNOSIS — R5383 Other fatigue: Secondary | ICD-10-CM | POA: Diagnosis not present

## 2016-04-07 LAB — CBC WITH DIFFERENTIAL/PLATELET
Basophils Absolute: 0.1 10*3/uL (ref 0–0.1)
Basophils Relative: 1 %
Eosinophils Absolute: 0.3 10*3/uL (ref 0–0.7)
HEMATOCRIT: 56 % — AB (ref 35.0–47.0)
Hemoglobin: 17.9 g/dL — ABNORMAL HIGH (ref 12.0–16.0)
LYMPHS ABS: 1.3 10*3/uL (ref 1.0–3.6)
MCH: 29.3 pg (ref 26.0–34.0)
MCHC: 32 g/dL (ref 32.0–36.0)
MCV: 91.5 fL (ref 80.0–100.0)
MONO ABS: 0.5 10*3/uL (ref 0.2–0.9)
Monocytes Relative: 5 %
NEUTROS ABS: 7.6 10*3/uL — AB (ref 1.4–6.5)
Neutrophils Relative %: 77 %
PLATELETS: 191 10*3/uL (ref 150–400)
RBC: 6.12 MIL/uL — AB (ref 3.80–5.20)
RDW: 32.4 % — ABNORMAL HIGH (ref 11.5–14.5)
WBC: 9.8 10*3/uL (ref 3.6–11.0)

## 2016-04-07 LAB — FERRITIN: Ferritin: 64 ng/mL (ref 11–307)

## 2016-04-07 LAB — IRON AND TIBC
Iron: 86 ug/dL (ref 28–170)
SATURATION RATIOS: 25 % (ref 10.4–31.8)
TIBC: 340 ug/dL (ref 250–450)
UIBC: 255 ug/dL

## 2016-04-07 NOTE — Progress Notes (Signed)
[facility]  [phone]) [phone] [phone]) [phone]  ID: Jeryl Columbia OB: 1946/09/12  MR#: [id number]  EHU#:[id number]  Patient Care Team: Idelle Crouch, MD as PCP - General (Internal Medicine)  CHIEF COMPLAINT:  Chief Complaint  Patient presents with  . polycythemia vera    INTERVAL HISTORY: Patient returns to clinic today for further evaluation and laboratory work. She has now been taking Hydrea for approximately 6 weeks and is tolerating it relatively well only with some mild increase in fatigue. She also admits to occasional headache.  She currently feels well and is asymptomatic. She has no other neurologic complaints. She denies any fevers, night sweats, or weight loss. She has no chest pain or shortness of breath. She denies any nausea, vomiting, constipation, or diarrhea. She has no urinary complaints. Patient feels at her baseline and offers no specific complaints today.  REVIEW OF SYSTEMS:   Review of Systems  Constitutional: Positive for malaise/fatigue. Negative for fever and weight loss.  Respiratory: Negative.   Cardiovascular: Negative.   Gastrointestinal: Negative.   Genitourinary: Negative.   Musculoskeletal: Negative.   Neurological: Positive for headaches. Negative for weakness.  Endo/Heme/Allergies: Does not bruise/bleed easily.    As per HPI. Otherwise, a complete review of systems is negatve.  PAST MEDICAL HISTORY: Past Medical History  Diagnosis Date  . Hyperthyroidism   . Polio   . PSVT (paroxysmal supraventricular tachycardia) ([place])   . Abnormality of gait 03/19/2016    PAST SURGICAL HISTORY: Past Surgical History  Procedure Laterality Date  . Breast surgery      breast biopsies  . Tonsillectomy    . Colonoscopy with propofol N/A 07/04/2015    Procedure: COLONOSCOPY WITH PROPOFOL;  Surgeon: Manya Silvas, MD;  Location[facility] ENDOSCOPY;  Service: Endoscopy;  Laterality: N/A;  . Breast biopsy Right 1999   EXCISIONAL - NEG  . Breast biopsy Right 2012    EXCISIONAL - NEG  . Electrophysiologic study N/A 12/16/2015    Procedure: SVT Ablation;  Surgeon: Evans Lance, MD;  Location: [facility];  Service: Cardiovascular;  Laterality: N/A;    FAMILY HISTORY: Reviewed and unchanged. No reported history of malignancy or chronic disease.     ADVANCED DIRECTIVES:    HEALTH MAINTENANCE: Social History  Substance Use Topics  . Smoking status: Never Smoker   . Smokeless tobacco: Not on file  . Alcohol Use: No     Colonoscopy:  PAP:  Bone density:  Lipid panel:  Allergies  Allergen Reactions  . Bee Venom Anaphylaxis  . Caffeine     Fever, chills, withdrawal symptoms    Current Outpatient Prescriptions  Medication Sig Dispense Refill  . ARMOUR THYROID 60 MG tablet Take 60 mg by mouth daily.  4  . calcium carbonate (OSCAL) 1500 (600 CA) MG TABS tablet Take 1,200 mg by mouth 2 (two) times daily with a meal.    . cholecalciferol (VITAMIN D) 1000 UNITS tablet Take 1,000 Units by mouth 3 (three) times a week.    . Cyanocobalamin (RA VITAMIN B-12 TR) 1000 MCG TBCR Take 1,000 mcg by mouth daily.     Marland Kitchen DHEA 25 MG CAPS Take 1 capsule by mouth 2 (two) times a week.    . Fish Oil-Cholecalciferol (FISH OIL + D3) 1000-1000 MG-UNIT CAPS Take 1,000 mg by mouth daily.     . folic acid (FOLVITE) 785 MCG tablet Take 800 mcg by mouth daily.     . hydroxyurea (HYDREA) 500 MG capsule  Take 500 mg by mouth daily.  2  . L-Theanine 100 MG CAPS Take 100 mg by mouth at bedtime.    Marland Kitchen MAGNESIUM CITRATE PO Take 200 mg by mouth daily.    . Probiotic Product (PROBIOTIC DAILY PO) Take 1 tablet by mouth daily.    Marland Kitchen tiZANidine (ZANAFLEX) 4 MG tablet Take 1 tablet by mouth 3 (three) times daily.  11   No current facility-administered medications for this visit.    OBJECTIVE: Filed Vitals:   04/07/16 1019  BP: 133/79  Pulse: 82  Temp: 97.9 F (36.6 C)  Resp: 16     Body mass index is 21.82 kg/(m^2).     ECOG FS:0 - Asymptomatic  General: Well-developed, well-nourished, no acute distress. Eyes: Pink conjunctiva, anicteric sclera. Lungs: Clear to auscultation bilaterally. Heart: Regular rate and rhythm. No rubs, murmurs, or gallops. Abdomen: Soft, nontender, nondistended. No organomegaly noted, normoactive bowel sounds. Musculoskeletal: No edema, cyanosis, or clubbing. Neuro: Alert, answering all questions appropriately. Cranial nerves grossly intact. Skin: No rashes or petechiae noted. Psych: Normal affect.    LAB RESULTS:  Lab Results  Component Value Date   NA 135 12/10/2015   K 4.3 12/10/2015   CL 99 12/10/2015   CO2 25 12/10/2015   GLUCOSE 89 12/10/2015   BUN 14 12/10/2015   CREATININE 0.62 12/10/2015   CALCIUM 9.2 12/10/2015   GFRNONAA >60 11/19/2015   GFRAA >60 11/19/2015    Lab Results  Component Value Date   WBC 9.8 04/07/2016   NEUTROABS 7.6* 04/07/2016   HGB 17.9* 04/07/2016   HCT 56.0* 04/07/2016   MCV 91.5 04/07/2016   PLT 191 04/07/2016   Lab Results  Component Value Date   IRON 86 04/07/2016   TIBC 340 04/07/2016   IRONPCTSAT 25 04/07/2016    Lab Results  Component Value Date   FERRITIN 15 10/22/2015     STUDIES: No results found.  ASSESSMENT: JAK-2 positive, polycythemia vera, iron deficiency, heterozygous for hemochromatosis gene mutation H63D   PLAN:    1. Polycythemia vera: JAK-2 mutation is positive. She does not require phlebotomy, but would consider in the future if there is not a more robust decreasing hemoglobin with Hydrea only. Return to clinic in 6 weeks for repeat laboratory work and then again in 3 months for repeat laboratory work and further evaluation. Iher hemoglobin continues to remain significantly elevated at that time, will then consider phlebotomy or increasing her dose of Hydrea. 2. Leukocytosis: Resolved. Likely related to P. vera.  BCR/ABL was negative for underlying CML. Her peripheral blood flow cytometry did reveal  approximately 1% myeloblasts.  Monitor.  Patient may need a bone marrow biopsy in the future for further evaluation., 3. Hemochromatosis: Patient is likely an unaffected carrier. No phlebotomy is needed.  Patient expressed understanding and was in agreement with this plan. She also understands that She can call clinic at any time with any questions, concerns, or complaints.   Lloyd Huger, MD   04/07/2016 5:17 PM

## 2016-04-07 NOTE — Progress Notes (Signed)
Since taking Hydrea she has been feeling more fatigued and occasional "upset stomach".

## 2016-04-15 LAB — COMP PANEL: LEUKEMIA/LYMPHOMA

## 2016-04-21 DIAGNOSIS — E559 Vitamin D deficiency, unspecified: Secondary | ICD-10-CM | POA: Insufficient documentation

## 2016-04-21 DIAGNOSIS — D751 Secondary polycythemia: Secondary | ICD-10-CM | POA: Insufficient documentation

## 2016-04-26 IMAGING — CR DG CHEST 2V
2 series · 2 of 2 positions shown · non-contrast
Comparison: None.

CLINICAL DATA: 69-year-old with heart palpitations and SVT.

EXAM:
CHEST  2 VIEW

[chest pa]
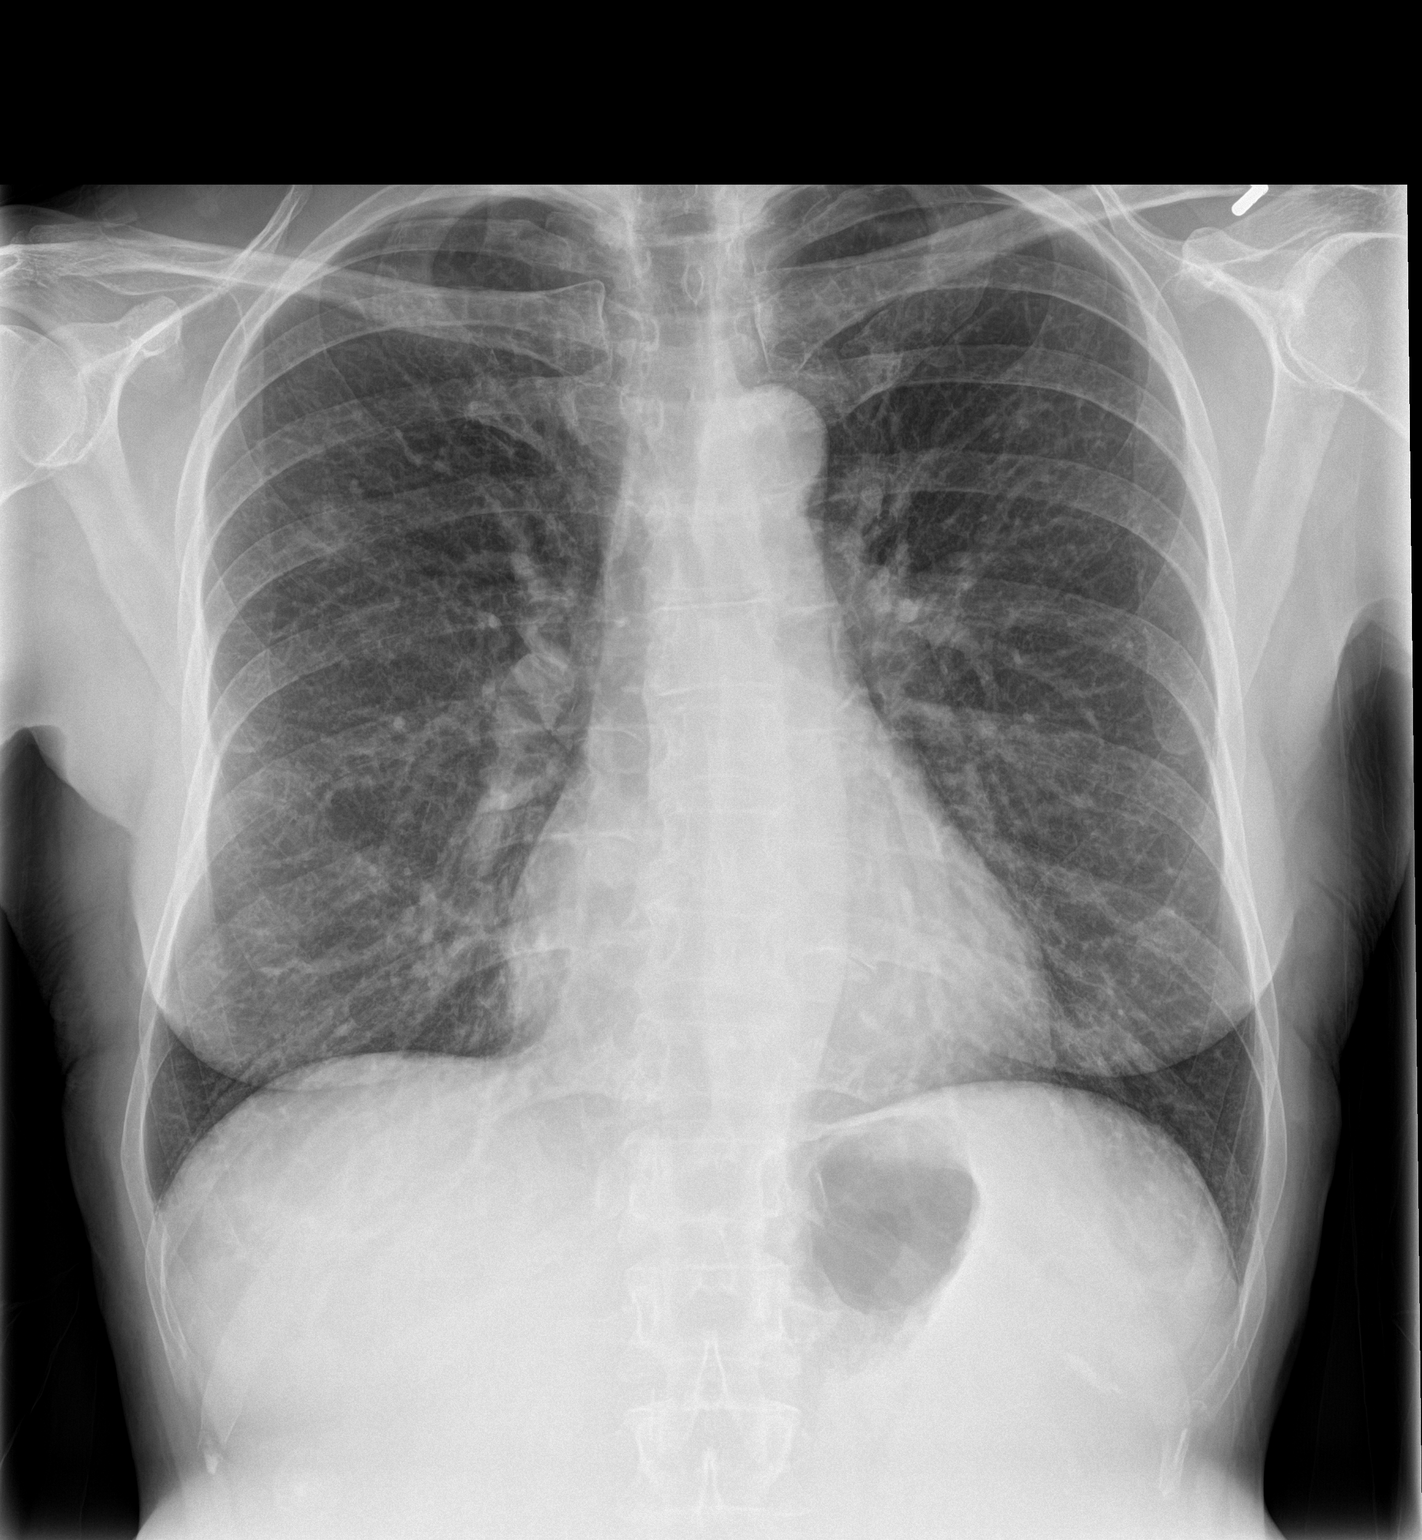

[chest lat]
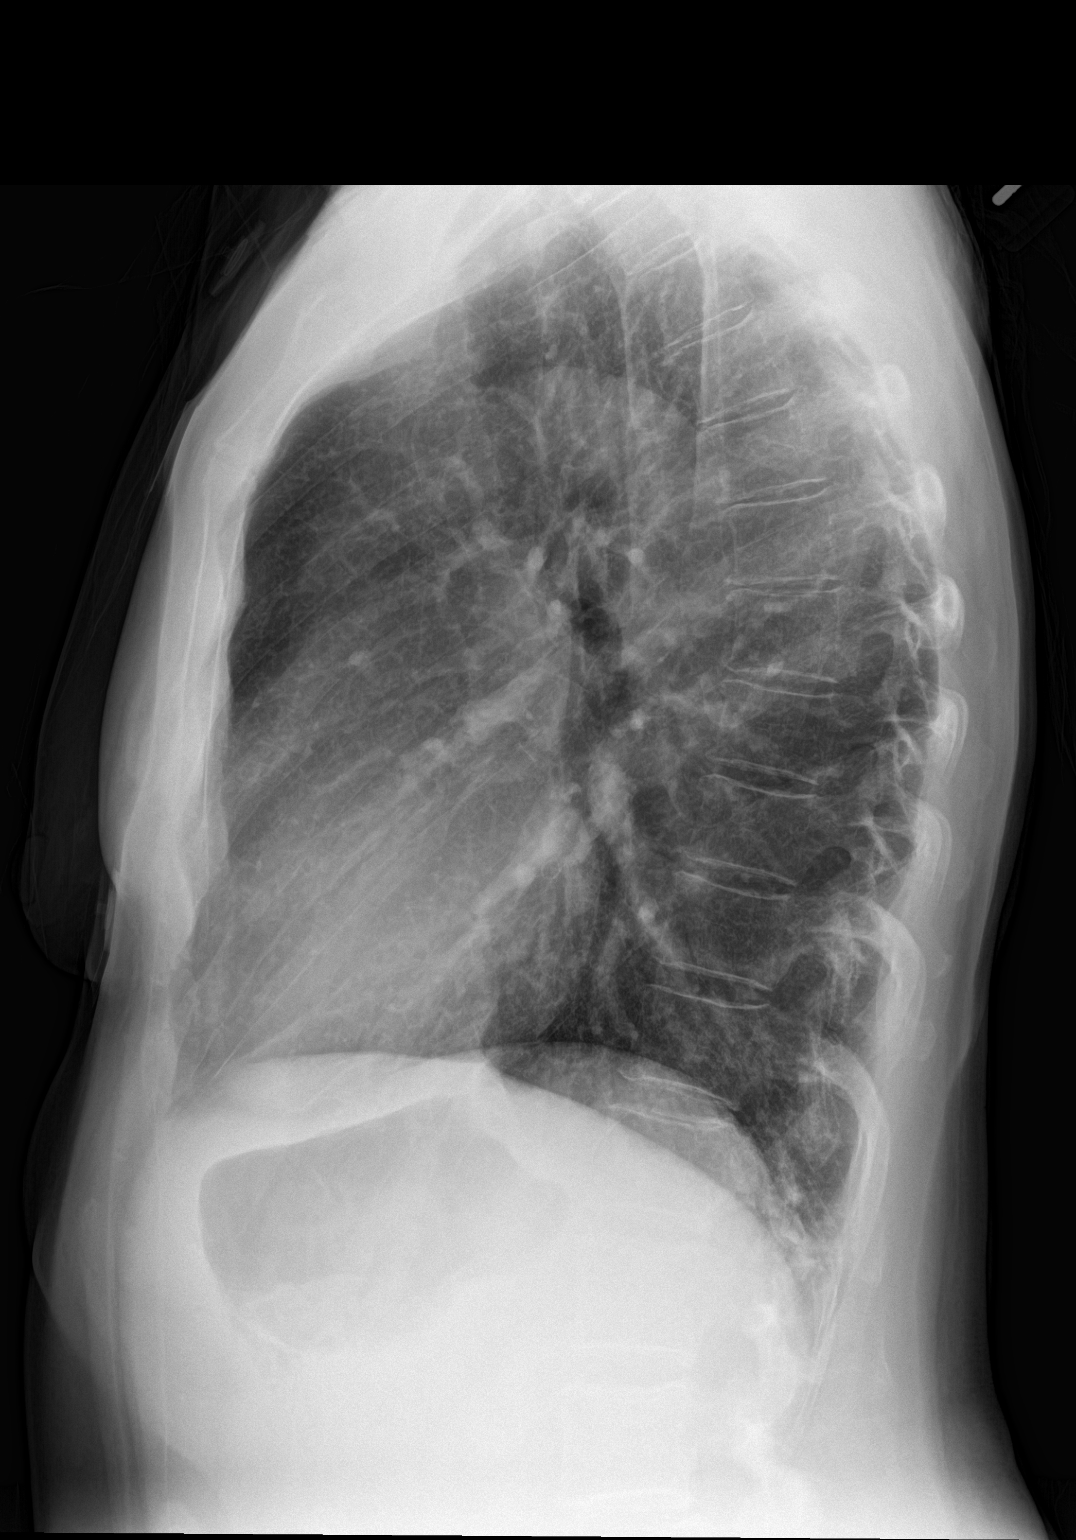

[2 of 2 positions shown; findings below may reference images not displayed]

FINDINGS: Coarse interstitial lung markings are likely chronic. No evidence
for pulmonary edema or focal airspace disease. There is a round
density overlying the right upper chest which is most compatible
with a skin ECG sticker. Heart size is normal. Trachea is midline.
Bony structures are unremarkable. No pleural effusions.
IMPRESSION: No active cardiopulmonary disease.

## 2016-05-06 IMAGING — CR DG CHEST 1V PORT
1 series · 1 of 1 positions shown · non-contrast
Comparison: 11/09/2015

CLINICAL DATA: Palpitations, chest heaviness

EXAM:
PORTABLE CHEST 1 VIEW

[portable]
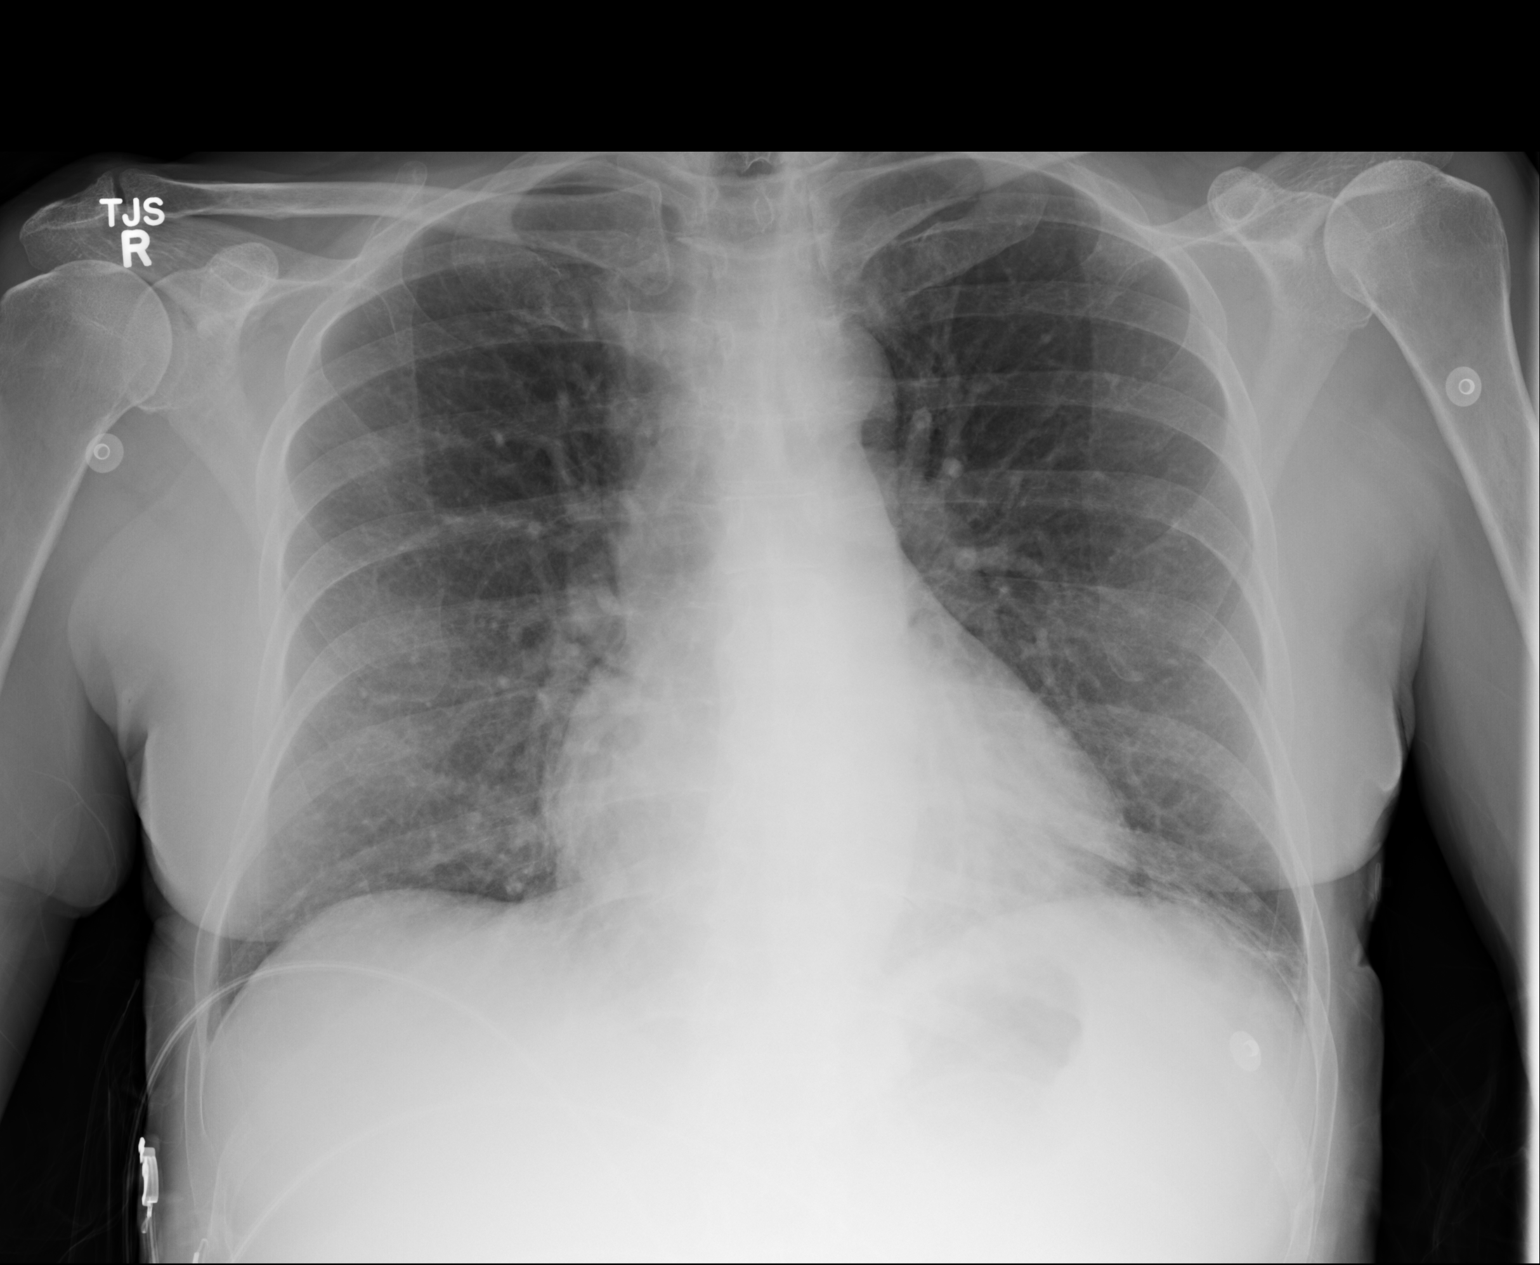

[1 of 1 positions shown; findings below may reference images not displayed]

FINDINGS: There is mild left basilar discoid atelectasis. There is no focal
parenchymal opacity. There is no pleural effusion or pneumothorax.
The heart and mediastinal contours are unremarkable.

The osseous structures are unremarkable.
IMPRESSION: No active disease.

## 2016-05-19 ENCOUNTER — Inpatient Hospital Stay: Payer: Medicare Other | Attending: Oncology | Admitting: *Deleted

## 2016-05-19 DIAGNOSIS — E038 Other specified hypothyroidism: Secondary | ICD-10-CM

## 2016-05-19 DIAGNOSIS — Z1329 Encounter for screening for other suspected endocrine disorder: Secondary | ICD-10-CM

## 2016-05-19 DIAGNOSIS — Z79899 Other long term (current) drug therapy: Secondary | ICD-10-CM | POA: Insufficient documentation

## 2016-05-19 DIAGNOSIS — D45 Polycythemia vera: Secondary | ICD-10-CM | POA: Insufficient documentation

## 2016-05-19 DIAGNOSIS — Z13220 Encounter for screening for lipoid disorders: Secondary | ICD-10-CM

## 2016-05-19 LAB — COMPREHENSIVE METABOLIC PANEL
ALK PHOS: 107 U/L (ref 38–126)
ALT: 20 U/L (ref 14–54)
ANION GAP: 7 (ref 5–15)
AST: 26 U/L (ref 15–41)
Albumin: 4.2 g/dL (ref 3.5–5.0)
BUN: 14 mg/dL (ref 6–20)
CALCIUM: 8.7 mg/dL — AB (ref 8.9–10.3)
CO2: 23 mmol/L (ref 22–32)
Chloride: 108 mmol/L (ref 101–111)
Creatinine, Ser: 0.58 mg/dL (ref 0.44–1.00)
GFR calc non Af Amer: >60 mL/min (ref >60)
Glucose, Bld: 102 mg/dL — ABNORMAL HIGH (ref 65–99)
Potassium: 4 mmol/L (ref 3.5–5.1)
SODIUM: 138 mmol/L (ref 135–145)
TOTAL PROTEIN: 6.8 g/dL (ref 6.5–8.1)
Total Bilirubin: 1.2 mg/dL (ref 0.3–1.2)

## 2016-05-19 LAB — CBC WITH DIFFERENTIAL/PLATELET
BASOS ABS: 0.1 10*3/uL (ref 0–0.1)
Basophils Relative: 1 %
EOS ABS: 0.3 10*3/uL (ref 0–0.7)
EOS PCT: 3 %
HCT: 53.8 % — ABNORMAL HIGH (ref 35.0–47.0)
HEMOGLOBIN: 17.5 g/dL — AB (ref 12.0–16.0)
LYMPHS ABS: 1.4 10*3/uL (ref 1.0–3.6)
Lymphocytes Relative: 14 %
MCH: 32.3 pg (ref 26.0–34.0)
MCHC: 32.5 g/dL (ref 32.0–36.0)
MCV: 99.2 fL (ref 80.0–100.0)
MONO ABS: 0.5 10*3/uL (ref 0.2–0.9)
Monocytes Relative: 5 %
Neutro Abs: 8.1 10*3/uL — ABNORMAL HIGH (ref 1.4–6.5)
Neutrophils Relative %: 77 %
PLATELETS: 178 10*3/uL (ref 150–440)
RBC: 5.42 MIL/uL — AB (ref 3.80–5.20)
RDW: 23.2 % — ABNORMAL HIGH (ref 11.5–14.5)
WBC: 10.5 10*3/uL (ref 3.6–11.0)

## 2016-05-19 LAB — URINALYSIS COMPLETE WITH MICROSCOPIC (ARMC ONLY)
Bilirubin Urine: NEGATIVE
Glucose, UA: NEGATIVE mg/dL
Ketones, ur: NEGATIVE mg/dL
Nitrite: NEGATIVE
PH: 5 (ref 5.0–8.0)
PROTEIN: NEGATIVE mg/dL
Specific Gravity, Urine: 1.005 (ref 1.005–1.030)

## 2016-05-19 LAB — LIPID PANEL
Cholesterol: 177 mg/dL (ref 0–200)
HDL: 29 mg/dL — AB (ref >40)
LDL CALC: 96 mg/dL (ref 0–99)
TRIGLYCERIDES: 260 mg/dL — AB (ref <150)
Total CHOL/HDL Ratio: 6.1 RATIO
VLDL: 52 mg/dL — ABNORMAL HIGH (ref 0–40)

## 2016-05-19 LAB — TSH: TSH: 4.299 u[IU]/mL (ref 0.350–4.500)

## 2016-07-05 ENCOUNTER — Other Ambulatory Visit: Payer: Self-pay | Admitting: Oncology

## 2016-07-07 ENCOUNTER — Other Ambulatory Visit: Payer: Medicare Other

## 2016-07-07 ENCOUNTER — Ambulatory Visit: Payer: Medicare Other | Admitting: Oncology

## 2016-07-15 ENCOUNTER — Other Ambulatory Visit: Payer: Medicare Other

## 2016-07-15 ENCOUNTER — Ambulatory Visit: Payer: Medicare Other | Admitting: Oncology

## 2016-07-20 NOTE — Progress Notes (Signed)
[place]  Telephone:(336) [phone] Fax:(336) [phone]  ID: Priscilla Berry OB: [birth year]  MR#: [id number]  JIR#:[id number]  Patient Care Team: Idelle Crouch, MD as PCP - General (Internal Medicine)  CHIEF COMPLAINT: JAK-2 positive, polycythemia vera, iron deficiency, heterozygous for hemochromatosis gene mutation H63D    INTERVAL HISTORY: Patient returns to clinic today for further evaluation and laboratory work. She is tolerating Hydrea well without significant side effects. She does not complain of weakness or fatigue today. She also admits to occasional headache.  She currently feels well and is asymptomatic. She has no other neurologic complaints. She denies any fevers, night sweats, or weight loss. She has no chest pain or shortness of breath. She denies any nausea, vomiting, constipation, or diarrhea. She has no urinary complaints. Patient offers no specific complaints today.  REVIEW OF SYSTEMS:   Review of Systems  Constitutional: Negative for fever, malaise/fatigue and weight loss.  Respiratory: Negative.  Negative for cough and shortness of breath.   Cardiovascular: Negative.  Negative for chest pain.  Gastrointestinal: Negative.  Negative for abdominal pain.  Genitourinary: Negative.   Musculoskeletal: Negative.   Neurological: Positive for headaches. Negative for weakness.  Endo/Heme/Allergies: Does not bruise/bleed easily.  Psychiatric/Behavioral: Negative.     As per HPI. Otherwise, a complete review of systems is negatve.  PAST MEDICAL HISTORY: Past Medical History:  Diagnosis Date  . Abnormality of gait 03/19/2016  . Hyperthyroidism   . Polio   . PSVT (paroxysmal supraventricular tachycardia) ([place])     PAST SURGICAL HISTORY: Past Surgical History:  Procedure Laterality Date  . BREAST BIOPSY Right 1999   EXCISIONAL - NEG  . BREAST BIOPSY Right 2012   EXCISIONAL - NEG  . BREAST SURGERY     breast biopsies  . COLONOSCOPY WITH  PROPOFOL N/A 07/04/2015   Procedure: COLONOSCOPY WITH PROPOFOL;  Surgeon: Manya Silvas, MD;  Location: [facility] ENDOSCOPY;  Service: Endoscopy;  Laterality: N/A;  . ELECTROPHYSIOLOGIC STUDY N/A 12/16/2015   Procedure: SVT Ablation;  Surgeon: Evans Lance, MD;  Location: [place] CV LAB;  Service: Cardiovascular;  Laterality: N/A;  . TONSILLECTOMY      FAMILY HISTORY: Reviewed and unchanged. No reported history of malignancy or chronic disease.     ADVANCED DIRECTIVES:    HEALTH MAINTENANCE: Social History  Substance Use Topics  . Smoking status: Never Smoker  . Smokeless tobacco: Never Used  . Alcohol use No     Colonoscopy:  PAP:  Bone density:  Lipid panel:  Allergies  Allergen Reactions  . Bee Venom Anaphylaxis  . Caffeine     Fever, chills, withdrawal symptoms    Current Outpatient Prescriptions  Medication Sig Dispense Refill  . ARMOUR THYROID 60 MG tablet Take 60 mg by mouth daily.  4  . calcium carbonate (OSCAL) 1500 (600 CA) MG TABS tablet Take 1,200 mg by mouth 2 (two) times daily with a meal.    . cholecalciferol (VITAMIN D) 1000 UNITS tablet Take 1,000 Units by mouth 3 (three) times a week.    . Cyanocobalamin (RA VITAMIN B-12 TR) 1000 MCG TBCR Take 1,000 mcg by mouth daily.     Marland Kitchen DHEA 25 MG CAPS Take 1 capsule by mouth 2 (two) times a week.    . Fish Oil-Cholecalciferol (FISH OIL + D3) 1000-1000 MG-UNIT CAPS Take 1,000 mg by mouth daily.     . folic acid (FOLVITE) 751 MCG tablet Take 800 mcg by mouth daily.     . hydroxyurea (  HYDREA) 500 MG capsule TAKE 1 CAPSULE (500 MG TOTAL) BY MOUTH DAILY. MAY TAKE WITH FOOD TO MINIMIZE GI SIDE EFFECTS. 30 capsule 2  . L-Theanine 100 MG CAPS Take 100 mg by mouth at bedtime.    Marland Kitchen MAGNESIUM CITRATE PO Take 200 mg by mouth daily.    . Probiotic Product (PROBIOTIC DAILY PO) Take 1 tablet by mouth daily.    Marland Kitchen tiZANidine (ZANAFLEX) 4 MG tablet Take by mouth 3 (three) times daily.   11   No current facility-administered  medications for this visit.     OBJECTIVE: Vitals:   07/21/16 1047  BP: 108/77  Pulse: 80  Resp: 17  Temp: (!) 96.9 F (36.1 C)     Body mass index is 22.13 kg/m.    ECOG FS:0 - Asymptomatic  General: Well-developed, well-nourished, no acute distress. Eyes: Pink conjunctiva, anicteric sclera. Lungs: Clear to auscultation bilaterally. Heart: Regular rate and rhythm. No rubs, murmurs, or gallops. Abdomen: Soft, nontender, nondistended. No organomegaly noted, normoactive bowel sounds. Musculoskeletal: No edema, cyanosis, or clubbing. Neuro: Alert, answering all questions appropriately. Cranial nerves grossly intact. Skin: No rashes or petechiae noted. Psych: Normal affect.    LAB RESULTS:  Lab Results  Component Value Date   NA 138 05/19/2016   K 4.0 05/19/2016   CL 108 05/19/2016   CO2 23 05/19/2016   GLUCOSE 102 (H) 05/19/2016   BUN 14 05/19/2016   CREATININE 0.58 05/19/2016   CALCIUM 8.7 (L) 05/19/2016   PROT 6.8 05/19/2016   ALBUMIN 4.2 05/19/2016   AST 26 05/19/2016   ALT 20 05/19/2016   ALKPHOS 107 05/19/2016   BILITOT 1.2 05/19/2016   GFRNONAA >60 05/19/2016   GFRAA >60 05/19/2016    Lab Results  Component Value Date   WBC 10.1 07/21/2016   NEUTROABS 7.7 (H) 07/21/2016   HGB 14.7 07/21/2016   HCT 42.7 07/21/2016   MCV 105.5 (H) 07/21/2016   PLT 189 07/21/2016   Lab Results  Component Value Date   IRON 108 07/21/2016   TIBC 279 07/21/2016   IRONPCTSAT 39 (H) 07/21/2016    Lab Results  Component Value Date   FERRITIN 327 (H) 07/21/2016     STUDIES: No results found.  ASSESSMENT: JAK-2 positive, polycythemia vera, iron deficiency, heterozygous for hemochromatosis gene mutation H63D   PLAN:    1. Polycythemia vera: JAK-2 mutation is positive. She does not require phlebotomy, but would consider in the future if necessary. Patient's hemoglobin has responded nicely, therefore continue Hydrea 500 mg daily. Return to clinic in 2 months for  repeat laboratory work and further evaluation. 2. Leukocytosis: Resolved. Likely related to P. vera.  BCR/ABL was negative for underlying CML. Her peripheral blood flow cytometry did reveal approximately 1% myeloblasts.  Monitor.  Patient may need a bone marrow biopsy in the future for further evaluation., 3. Hemochromatosis: Patient's iron stores have trended up slightly, but no phlebotomy as necessary at this time. Continue to monitor closely.   Patient expressed understanding and was in agreement with this plan. She also understands that She can call clinic at any time with any questions, concerns, or complaints.   Lloyd Huger, MD   07/23/2016 12:56 AM      [place]  Telephone:(336) [phone] Fax:(336) [phone]  ID: Priscilla Berry OB: 1946-11-13  MR#: [id number]  CBU#:[id number]  Patient Care Team: Idelle Crouch, MD as PCP - General (Internal Medicine)  CHIEF COMPLAINT:  Chief Complaint  Patient presents with  .  Follow-up    Polycythemia vera     INTERVAL HISTORY: Patient returns to clinic today for further evaluation and laboratory work. She has now been taking Hydrea for approximately 6 weeks and is tolerating it relatively well only with some mild increase in fatigue. She also admits to occasional headache.  She currently feels well and is asymptomatic. She has no other neurologic complaints. She denies any fevers, night sweats, or weight loss. She has no chest pain or shortness of breath. She denies any nausea, vomiting, constipation, or diarrhea. She has no urinary complaints. Patient feels at her baseline and offers no specific complaints today.  REVIEW OF SYSTEMS:   Review of Systems  Constitutional: Positive for malaise/fatigue. Negative for fever and weight loss.  Respiratory: Negative.   Cardiovascular: Negative.   Gastrointestinal: Negative.   Genitourinary: Negative.   Musculoskeletal: Negative.   Neurological: Positive for  headaches. Negative for weakness.  Endo/Heme/Allergies: Does not bruise/bleed easily.    As per HPI. Otherwise, a complete review of systems is negatve.  PAST MEDICAL HISTORY: Past Medical History:  Diagnosis Date  . Abnormality of gait 03/19/2016  . Hyperthyroidism   . Polio   . PSVT (paroxysmal supraventricular tachycardia) ([place])     PAST SURGICAL HISTORY: Past Surgical History:  Procedure Laterality Date  . BREAST BIOPSY Right 1999   EXCISIONAL - NEG  . BREAST BIOPSY Right 2012   EXCISIONAL - NEG  . BREAST SURGERY     breast biopsies  . COLONOSCOPY WITH PROPOFOL N/A 07/04/2015   Procedure: COLONOSCOPY WITH PROPOFOL;  Surgeon: Manya Silvas, MD;  Location: [facility] ENDOSCOPY;  Service: Endoscopy;  Laterality: N/A;  . ELECTROPHYSIOLOGIC STUDY N/A 12/16/2015   Procedure: SVT Ablation;  Surgeon: Evans Lance, MD;  Location: [place] CV LAB;  Service: Cardiovascular;  Laterality: N/A;  . TONSILLECTOMY      FAMILY HISTORY: Reviewed and unchanged. No reported history of malignancy or chronic disease.     ADVANCED DIRECTIVES:    HEALTH MAINTENANCE: Social History  Substance Use Topics  . Smoking status: Never Smoker  . Smokeless tobacco: Never Used  . Alcohol use No     Colonoscopy:  PAP:  Bone density:  Lipid panel:  Allergies  Allergen Reactions  . Bee Venom Anaphylaxis  . Caffeine     Fever, chills, withdrawal symptoms    Current Outpatient Prescriptions  Medication Sig Dispense Refill  . ARMOUR THYROID 60 MG tablet Take 60 mg by mouth daily.  4  . calcium carbonate (OSCAL) 1500 (600 CA) MG TABS tablet Take 1,200 mg by mouth 2 (two) times daily with a meal.    . cholecalciferol (VITAMIN D) 1000 UNITS tablet Take 1,000 Units by mouth 3 (three) times a week.    . Cyanocobalamin (RA VITAMIN B-12 TR) 1000 MCG TBCR Take 1,000 mcg by mouth daily.     Marland Kitchen DHEA 25 MG CAPS Take 1 capsule by mouth 2 (two) times a week.    . Fish Oil-Cholecalciferol (FISH OIL + D3)  1000-1000 MG-UNIT CAPS Take 1,000 mg by mouth daily.     . folic acid (FOLVITE) 253 MCG tablet Take 800 mcg by mouth daily.     . hydroxyurea (HYDREA) 500 MG capsule TAKE 1 CAPSULE (500 MG TOTAL) BY MOUTH DAILY. MAY TAKE WITH FOOD TO MINIMIZE GI SIDE EFFECTS. 30 capsule 2  . L-Theanine 100 MG CAPS Take 100 mg by mouth at bedtime.    Marland Kitchen MAGNESIUM CITRATE PO Take 200 mg by mouth  daily.    . Probiotic Product (PROBIOTIC DAILY PO) Take 1 tablet by mouth daily.    Marland Kitchen tiZANidine (ZANAFLEX) 4 MG tablet Take by mouth 3 (three) times daily.   11   No current facility-administered medications for this visit.     OBJECTIVE: Vitals:   07/21/16 1047  BP: 108/77  Pulse: 80  Resp: 17  Temp: (!) 96.9 F (36.1 C)     Body mass index is 22.13 kg/m.    ECOG FS:0 - Asymptomatic  General: Well-developed, well-nourished, no acute distress. Eyes: Pink conjunctiva, anicteric sclera. Lungs: Clear to auscultation bilaterally. Heart: Regular rate and rhythm. No rubs, murmurs, or gallops. Abdomen: Soft, nontender, nondistended. No organomegaly noted, normoactive bowel sounds. Musculoskeletal: No edema, cyanosis, or clubbing. Neuro: Alert, answering all questions appropriately. Cranial nerves grossly intact. Skin: No rashes or petechiae noted. Psych: Normal affect.    LAB RESULTS:  Lab Results  Component Value Date   NA 138 05/19/2016   K 4.0 05/19/2016   CL 108 05/19/2016   CO2 23 05/19/2016   GLUCOSE 102 (H) 05/19/2016   BUN 14 05/19/2016   CREATININE 0.58 05/19/2016   CALCIUM 8.7 (L) 05/19/2016   PROT 6.8 05/19/2016   ALBUMIN 4.2 05/19/2016   AST 26 05/19/2016   ALT 20 05/19/2016   ALKPHOS 107 05/19/2016   BILITOT 1.2 05/19/2016   GFRNONAA >60 05/19/2016   GFRAA >60 05/19/2016    Lab Results  Component Value Date   WBC 10.1 07/21/2016   NEUTROABS 7.7 (H) 07/21/2016   HGB 14.7 07/21/2016   HCT 42.7 07/21/2016   MCV 105.5 (H) 07/21/2016   PLT 189 07/21/2016   Lab Results    Component Value Date   IRON 108 07/21/2016   TIBC 279 07/21/2016   IRONPCTSAT 39 (H) 07/21/2016    Lab Results  Component Value Date   FERRITIN 327 (H) 07/21/2016     STUDIES: No results found.  ASSESSMENT: JAK-2 positive, polycythemia vera, iron deficiency, heterozygous for hemochromatosis gene mutation H63D   PLAN:    1. Polycythemia vera: JAK-2 mutation is positive. She does not require phlebotomy, but would consider in the future if there is not a more robust decreasing hemoglobin with Hydrea only. Return to clinic in 6 weeks for repeat laboratory work and then again in 3 months for repeat laboratory work and further evaluation. Iher hemoglobin continues to remain significantly elevated at that time, will then consider phlebotomy or increasing her dose of Hydrea. 2. Leukocytosis: Resolved. Likely related to P. vera.  BCR/ABL was negative for underlying CML. Her peripheral blood flow cytometry did reveal approximately 1% myeloblasts.  Monitor.  Patient may need a bone marrow biopsy in the future for further evaluation., 3. Hemochromatosis: Patient is likely an unaffected carrier. No phlebotomy is needed.  Patient expressed understanding and was in agreement with this plan. She also understands that She can call clinic at any time with any questions, concerns, or complaints.   Lloyd Huger, MD   07/23/2016 12:56 AM

## 2016-07-21 ENCOUNTER — Other Ambulatory Visit: Payer: Self-pay

## 2016-07-21 ENCOUNTER — Inpatient Hospital Stay: Payer: Medicare Other

## 2016-07-21 ENCOUNTER — Inpatient Hospital Stay: Payer: Medicare Other | Attending: Oncology | Admitting: Oncology

## 2016-07-21 ENCOUNTER — Encounter: Payer: Self-pay | Admitting: Oncology

## 2016-07-21 VITALS — BP 108/77 | HR 80 | Temp 96.9°F | Resp 17 | Ht 67.0 in | Wt 141.3 lb

## 2016-07-21 DIAGNOSIS — D45 Polycythemia vera: Secondary | ICD-10-CM | POA: Diagnosis not present

## 2016-07-21 DIAGNOSIS — R269 Unspecified abnormalities of gait and mobility: Secondary | ICD-10-CM | POA: Diagnosis not present

## 2016-07-21 DIAGNOSIS — E0590 Thyrotoxicosis, unspecified without thyrotoxic crisis or storm: Secondary | ICD-10-CM | POA: Diagnosis not present

## 2016-07-21 DIAGNOSIS — D509 Iron deficiency anemia, unspecified: Secondary | ICD-10-CM

## 2016-07-21 DIAGNOSIS — Z79899 Other long term (current) drug therapy: Secondary | ICD-10-CM | POA: Diagnosis not present

## 2016-07-21 DIAGNOSIS — R51 Headache: Secondary | ICD-10-CM | POA: Insufficient documentation

## 2016-07-21 DIAGNOSIS — I471 Supraventricular tachycardia: Secondary | ICD-10-CM | POA: Insufficient documentation

## 2016-07-21 LAB — CBC WITH DIFFERENTIAL/PLATELET
Basophils Absolute: 0.1 K/uL (ref 0–0.1)
Basophils Relative: 1 %
Eosinophils Absolute: 0.2 K/uL (ref 0–0.7)
Eosinophils Relative: 2 %
HCT: 42.7 % (ref 35.0–47.0)
Hemoglobin: 14.7 g/dL (ref 12.0–16.0)
Lymphocytes Relative: 17 %
Lymphs Abs: 1.7 K/uL (ref 1.0–3.6)
MCH: 36.2 pg — ABNORMAL HIGH (ref 26.0–34.0)
MCHC: 34.3 g/dL (ref 32.0–36.0)
MCV: 105.5 fL — ABNORMAL HIGH (ref 80.0–100.0)
Monocytes Absolute: 0.4 K/uL (ref 0.2–0.9)
Monocytes Relative: 4 %
Neutro Abs: 7.7 K/uL — ABNORMAL HIGH (ref 1.4–6.5)
Neutrophils Relative %: 76 %
Platelets: 189 K/uL (ref 150–440)
RBC: 4.05 MIL/uL (ref 3.80–5.20)
RDW: 18.9 % — ABNORMAL HIGH (ref 11.5–14.5)
WBC: 10.1 K/uL (ref 3.6–11.0)

## 2016-07-21 LAB — FERRITIN: FERRITIN: 327 ng/mL — AB (ref 11–307)

## 2016-07-21 LAB — IRON AND TIBC
IRON: 108 ug/dL (ref 28–170)
Saturation Ratios: 39 % — ABNORMAL HIGH (ref 10.4–31.8)
TIBC: 279 ug/dL (ref 250–450)
UIBC: 171 ug/dL

## 2016-07-21 NOTE — Progress Notes (Signed)
Pt complaining of mild to moderate fatigue.  Pt would like know about easily bruising now

## 2016-09-17 ENCOUNTER — Encounter: Payer: Medicare Other | Admitting: Physical Medicine & Rehabilitation

## 2016-09-21 ENCOUNTER — Ambulatory Visit: Payer: Medicare Other | Admitting: Oncology

## 2016-09-21 ENCOUNTER — Other Ambulatory Visit: Payer: Medicare Other

## 2016-09-29 ENCOUNTER — Inpatient Hospital Stay: Payer: Medicare Other

## 2016-09-29 ENCOUNTER — Inpatient Hospital Stay: Payer: Medicare Other | Admitting: Oncology

## 2016-10-05 ENCOUNTER — Other Ambulatory Visit: Payer: Self-pay | Admitting: *Deleted

## 2016-10-05 MED ORDER — HYDROXYUREA 500 MG PO CAPS: ORAL_CAPSULE | ORAL | 2 refills | Status: DC

## 2016-10-08 ENCOUNTER — Other Ambulatory Visit: Payer: Self-pay | Admitting: *Deleted

## 2016-10-08 DIAGNOSIS — D45 Polycythemia vera: Secondary | ICD-10-CM

## 2016-10-11 ENCOUNTER — Inpatient Hospital Stay: Payer: Medicare Other | Attending: Oncology

## 2016-10-11 ENCOUNTER — Inpatient Hospital Stay: Payer: Medicare Other

## 2016-10-11 ENCOUNTER — Inpatient Hospital Stay (HOSPITAL_BASED_OUTPATIENT_CLINIC_OR_DEPARTMENT_OTHER): Payer: Medicare Other | Admitting: Oncology

## 2016-10-11 VITALS — BP 126/79 | HR 70 | Temp 98.3°F | Resp 18 | Wt 142.2 lb

## 2016-10-11 DIAGNOSIS — D509 Iron deficiency anemia, unspecified: Secondary | ICD-10-CM | POA: Insufficient documentation

## 2016-10-11 DIAGNOSIS — I471 Supraventricular tachycardia: Secondary | ICD-10-CM | POA: Insufficient documentation

## 2016-10-11 DIAGNOSIS — D45 Polycythemia vera: Secondary | ICD-10-CM

## 2016-10-11 DIAGNOSIS — Z79899 Other long term (current) drug therapy: Secondary | ICD-10-CM

## 2016-10-11 DIAGNOSIS — R269 Unspecified abnormalities of gait and mobility: Secondary | ICD-10-CM | POA: Insufficient documentation

## 2016-10-11 DIAGNOSIS — E0590 Thyrotoxicosis, unspecified without thyrotoxic crisis or storm: Secondary | ICD-10-CM | POA: Insufficient documentation

## 2016-10-11 LAB — CBC WITH DIFFERENTIAL/PLATELET
BASOS PCT: 1 %
Basophils Absolute: 0.1 10*3/uL (ref 0–0.1)
EOS ABS: 0.2 10*3/uL (ref 0–0.7)
Eosinophils Relative: 2 %
HEMATOCRIT: 38.4 % (ref 35.0–47.0)
Hemoglobin: 12.9 g/dL (ref 12.0–16.0)
Lymphocytes Relative: 16 %
Lymphs Abs: 1.4 10*3/uL (ref 1.0–3.6)
MCH: 36.2 pg — ABNORMAL HIGH (ref 26.0–34.0)
MCHC: 33.7 g/dL (ref 32.0–36.0)
MCV: 107.3 fL — ABNORMAL HIGH (ref 80.0–100.0)
MONO ABS: 0.3 10*3/uL (ref 0.2–0.9)
MONOS PCT: 3 %
Neutro Abs: 6.7 10*3/uL — ABNORMAL HIGH (ref 1.4–6.5)
Neutrophils Relative %: 78 %
Platelets: 186 10*3/uL (ref 150–440)
RBC: 3.58 MIL/uL — ABNORMAL LOW (ref 3.80–5.20)
RDW: 17.8 % — AB (ref 11.5–14.5)
WBC: 8.7 10*3/uL (ref 3.6–11.0)

## 2016-10-11 LAB — IRON AND TIBC
IRON: 70 ug/dL (ref 28–170)
Saturation Ratios: 27 % (ref 10.4–31.8)
TIBC: 263 ug/dL (ref 250–450)
UIBC: 193 ug/dL

## 2016-10-11 LAB — FERRITIN: FERRITIN: 360 ng/mL — AB (ref 11–307)

## 2016-10-11 NOTE — Progress Notes (Signed)
States is feeling well. Offers no complaints. 

## 2016-10-11 NOTE — Progress Notes (Signed)
[place]  Telephone:(336) [phone] Fax:(336) [phone]  ID: Priscilla Berry OB: 1946-10-06  MR#: [id number]  HFS#:[id number]  Patient Care Team: Idelle Crouch, MD as PCP - General (Internal Medicine)  CHIEF COMPLAINT: JAK-2 positive, polycythemia vera, iron deficiency, heterozygous for hemochromatosis gene mutation H63D   INTERVAL HISTORY: Patient returns to clinic today for further evaluation and laboratory work. She is tolerating Hydrea well without significant side effects. She does not complain of weakness or fatigue today.  She currently feels well and is asymptomatic. She has no neurologic complaints. She denies any fevers, night sweats, or weight loss. She has no chest pain or shortness of breath. She denies any nausea, vomiting, constipation, or diarrhea. She has no urinary complaints. Patient offers no specific complaints today.  REVIEW OF SYSTEMS:   Review of Systems  Constitutional: Negative for fever, malaise/fatigue and weight loss.  Respiratory: Negative.  Negative for cough and shortness of breath.   Cardiovascular: Negative.  Negative for chest pain.  Gastrointestinal: Negative.  Negative for abdominal pain.  Genitourinary: Negative.   Musculoskeletal: Negative.   Neurological: Negative.  Negative for weakness and headaches.  Endo/Heme/Allergies: Does not bruise/bleed easily.  Psychiatric/Behavioral: Negative.  The patient is not nervous/anxious.     As per HPI. Otherwise, a complete review of systems is negative.  PAST MEDICAL HISTORY: Past Medical History:  Diagnosis Date  . Abnormality of gait 03/19/2016  . Hyperthyroidism   . Polio   . PSVT (paroxysmal supraventricular tachycardia) ([place])     PAST SURGICAL HISTORY: Past Surgical History:  Procedure Laterality Date  . BREAST BIOPSY Right 1999   EXCISIONAL - NEG  . BREAST BIOPSY Right 2012   EXCISIONAL - NEG  . BREAST SURGERY     breast biopsies  . COLONOSCOPY WITH PROPOFOL N/A  07/04/2015   Procedure: COLONOSCOPY WITH PROPOFOL;  Surgeon: Manya Silvas, MD;  Location: [facility] ENDOSCOPY;  Service: Endoscopy;  Laterality: N/A;  . ELECTROPHYSIOLOGIC STUDY N/A 12/16/2015   Procedure: SVT Ablation;  Surgeon: Evans Lance, MD;  Location: [place] CV LAB;  Service: Cardiovascular;  Laterality: N/A;  . TONSILLECTOMY      FAMILY HISTORY: Reviewed and unchanged. No reported history of malignancy or chronic disease.     ADVANCED DIRECTIVES:    HEALTH MAINTENANCE: Social History  Substance Use Topics  . Smoking status: Never Smoker  . Smokeless tobacco: Never Used  . Alcohol use No     Colonoscopy:  PAP:  Bone density:  Lipid panel:  Allergies  Allergen Reactions  . Bee Venom Anaphylaxis  . Caffeine     Fever, chills, withdrawal symptoms    Current Outpatient Prescriptions  Medication Sig Dispense Refill  . ARMOUR THYROID 60 MG tablet Take 60 mg by mouth daily.  4  . calcium carbonate (OSCAL) 1500 (600 CA) MG TABS tablet Take 1,200 mg by mouth 2 (two) times daily with a meal.    . cholecalciferol (VITAMIN D) 1000 UNITS tablet Take 1,000 Units by mouth 3 (three) times a week.    . Cyanocobalamin (RA VITAMIN B-12 TR) 1000 MCG TBCR Take 1,000 mcg by mouth daily.     Marland Kitchen DHEA 25 MG CAPS Take 1 capsule by mouth 2 (two) times a week.    . Fish Oil-Cholecalciferol (FISH OIL + D3) 1000-1000 MG-UNIT CAPS Take 1,000 mg by mouth daily.     . folic acid (FOLVITE) 233 MCG tablet Take 800 mcg by mouth daily.     . hydroxyurea (HYDREA)  500 MG capsule TAKE 1 CAPSULE (500 MG TOTAL) BY MOUTH DAILY. MAY TAKE WITH FOOD TO MINIMIZE GI SIDE EFFECTS. 30 capsule 2  . L-Theanine 100 MG CAPS Take 100 mg by mouth at bedtime.    Marland Kitchen MAGNESIUM CITRATE PO Take 200 mg by mouth daily.    . Probiotic Product (PROBIOTIC DAILY PO) Take 1 tablet by mouth daily.    Marland Kitchen tiZANidine (ZANAFLEX) 4 MG tablet Take by mouth 3 (three) times daily.   11   No current facility-administered medications for  this visit.     OBJECTIVE: Vitals:   10/11/16 1418  BP: 126/79  Pulse: 70  Resp: 18  Temp: 98.3 F (36.8 C)     Body mass index is 22.27 kg/m.    ECOG FS:0 - Asymptomatic  General: Well-developed, well-nourished, no acute distress. Eyes: Pink conjunctiva, anicteric sclera. Lungs: Clear to auscultation bilaterally. Heart: Regular rate and rhythm. No rubs, murmurs, or gallops. Abdomen: Soft, nontender, nondistended. No organomegaly noted, normoactive bowel sounds. Musculoskeletal: No edema, cyanosis, or clubbing. Neuro: Alert, answering all questions appropriately. Cranial nerves grossly intact. Skin: No rashes or petechiae noted. Psych: Normal affect.    LAB RESULTS:  Lab Results  Component Value Date   NA 138 05/19/2016   K 4.0 05/19/2016   CL 108 05/19/2016   CO2 23 05/19/2016   GLUCOSE 102 (H) 05/19/2016   BUN 14 05/19/2016   CREATININE 0.58 05/19/2016   CALCIUM 8.7 (L) 05/19/2016   PROT 6.8 05/19/2016   ALBUMIN 4.2 05/19/2016   AST 26 05/19/2016   ALT 20 05/19/2016   ALKPHOS 107 05/19/2016   BILITOT 1.2 05/19/2016   GFRNONAA >60 05/19/2016   GFRAA >60 05/19/2016    Lab Results  Component Value Date   WBC 8.7 10/11/2016   NEUTROABS 6.7 (H) 10/11/2016   HGB 12.9 10/11/2016   HCT 38.4 10/11/2016   MCV 107.3 (H) 10/11/2016   PLT 186 10/11/2016   Lab Results  Component Value Date   IRON 70 10/11/2016   TIBC 263 10/11/2016   IRONPCTSAT 27 10/11/2016    Lab Results  Component Value Date   FERRITIN 360 (H) 10/11/2016     STUDIES: No results found.  ASSESSMENT: JAK-2 positive, polycythemia vera, iron deficiency, heterozygous for hemochromatosis gene mutation H63D   PLAN:    1. Polycythemia vera: JAK-2 mutation is positive. She does not require phlebotomy, but would consider in the future if necessary. Patient's hemoglobin Is currently within normal limits. Continue Hydrea 500 mg daily. Return to clinic in 3 months for repeat laboratory work and  further evaluation. 2. Leukocytosis: Resolved. Likely related to P. vera.  BCR/ABL was negative for underlying CML. Her peripheral blood flow cytometry did reveal approximately 1% myeloblasts.  Monitor.  Patient may need a bone marrow biopsy in the future for further evaluation, this is not necessary at this point. 3. Hemochromatosis: Patient's ferritin remains elevated. No phlebotomy is necessary at this time, but patient may require in the future. Continue to monitor closely. 4. Elevated MCV: Secondary to Hydrea.    Patient expressed understanding and was in agreement with this plan. She also understands that She can call clinic at any time with any questions, concerns, or complaints.   Lloyd Huger, MD   10/16/2016 9:59 AM

## 2016-10-27 DIAGNOSIS — E782 Mixed hyperlipidemia: Secondary | ICD-10-CM | POA: Insufficient documentation

## 2016-11-01 ENCOUNTER — Other Ambulatory Visit: Payer: Self-pay | Admitting: Internal Medicine

## 2016-11-01 DIAGNOSIS — Z1231 Encounter for screening mammogram for malignant neoplasm of breast: Secondary | ICD-10-CM

## 2016-12-08 ENCOUNTER — Ambulatory Visit: Payer: Medicare Other

## 2016-12-22 ENCOUNTER — Ambulatory Visit
Admission: RE | Admit: 2016-12-22 | Discharge: 2016-12-22 | Disposition: A | Discharge status: Home / Self Care | Payer: Medicare Other | Source: Ambulatory Visit | Attending: Internal Medicine | Admitting: Internal Medicine

## 2016-12-22 DIAGNOSIS — Z1231 Encounter for screening mammogram for malignant neoplasm of breast: Secondary | ICD-10-CM | POA: Insufficient documentation

## 2016-12-27 ENCOUNTER — Other Ambulatory Visit: Payer: Self-pay | Admitting: *Deleted

## 2016-12-27 MED ORDER — HYDROXYUREA 500 MG PO CAPS: ORAL_CAPSULE | ORAL | 2 refills | Status: DC

## 2017-01-11 ENCOUNTER — Other Ambulatory Visit: Payer: Medicare Other

## 2017-01-11 ENCOUNTER — Other Ambulatory Visit: Payer: Self-pay | Admitting: Oncology

## 2017-01-11 ENCOUNTER — Ambulatory Visit: Payer: Medicare Other | Admitting: Oncology

## 2017-01-11 NOTE — Progress Notes (Signed)
[place]  Telephone:(336) [phone] Fax:(336) [phone]  ID: Priscilla Berry OB: 1946-08-21  MR#: [id number]  [id number]  Patient Care Team: Idelle Crouch, MD as PCP - General (Internal Medicine)  CHIEF COMPLAINT: JAK-2 positive, polycythemia vera, iron deficiency, heterozygous for hemochromatosis gene mutation H63D   INTERVAL HISTORY: Patient returns to clinic today for further evaluation and laboratory work. She is tolerating Hydrea well without significant side effects. She reports new onset weakness and fatigue, and notes that she is more involved in taking care of her parents, and home schooling her grandchildren.  She currently feels well and is asymptomatic. She complains of chronic headache, relieved with aspiring, no other neurologic complaints. She denies any fevers, night sweats, or weight loss. She has no chest pain or shortness of breath. She denies any nausea, vomiting, constipation, or diarrhea. She has no urinary complaints. She reports occasional insomnia. Patient offers no other specific complaints today.  REVIEW OF SYSTEMS:   Review of Systems  Constitutional: Positive for malaise/fatigue. Negative for fever and weight loss.  HENT: Negative for congestion and sinus pain.   Respiratory: Negative.  Negative for cough and shortness of breath.   Cardiovascular: Negative.  Negative for chest pain.  Gastrointestinal: Negative.  Negative for abdominal pain, constipation, diarrhea, nausea and vomiting.  Genitourinary: Negative.   Musculoskeletal: Negative.   Neurological: Positive for weakness and headaches.  Endo/Heme/Allergies: Does not bruise/bleed easily.  Psychiatric/Behavioral: The patient has insomnia. The patient is not nervous/anxious.     As per HPI. Otherwise, a complete review of systems is negative.  PAST MEDICAL HISTORY: Past Medical History:  Diagnosis Date  . Abnormality of gait 03/19/2016  . Hyperthyroidism   . Polio   . PSVT  (paroxysmal supraventricular tachycardia) ([place])     PAST SURGICAL HISTORY: Past Surgical History:  Procedure Laterality Date  . BREAST BIOPSY Right 1999   EXCISIONAL - NEG  . BREAST BIOPSY Right 2012   EXCISIONAL - NEG  . BREAST SURGERY     breast biopsies  . COLONOSCOPY WITH PROPOFOL N/A 07/04/2015   Procedure: COLONOSCOPY WITH PROPOFOL;  Surgeon: Manya Silvas, MD;  Location[facility] END[facility]COPY;  Service: Endoscopy;  Laterality: N/A;  . ELECTROPHYSIOLOGIC STUDY N/A 12/16/2015   Procedure: SVT Ablation;  Surgeon: Evans Lance, MD;  Location: [place] [facility] LAB;  Service: Cardiovascular;  Laterality: N/A;  . TONSILLECTOMY      FAMILY HISTORY: Reviewed and unchanged. No reported history of malignancy or chronic disease.     ADVANCED DIRECTIVES:    HEALTH MAINTENANCE: Social History  Substance Use Topics  . Smoking status: Never Smoker  . Smokeless tobacco: Never Used  . Alcohol use No     Colonoscopy:  PAP:  Bone density:  Lipid panel:  Allergies  Allergen Reactions  . Bee Venom Anaphylaxis  . Caffeine     Fever, chills, withdrawal symptoms    Current Outpatient Prescriptions  Medication Sig Dispense Refill  . ARMOUR THYROID 60 MG tablet Take 60 mg by mouth daily.  4  . calcium carbonate (OSCAL) 1500 (600 CA) MG TABS tablet Take 1,200 mg by mouth 2 (two) times a week.     . cholecalciferol (VITAMIN D) 1000 UNITS tablet Take 1,000 Units by mouth daily.     . Cyanocobalamin (RA VITAMIN B-12 TR) 1000 MCG TBCR Take 1,000 mcg by mouth daily.     Marland Kitchen DHEA 25 MG CAPS Take 1 capsule by mouth 2 (two) times a week.    Marland Kitchen  Fish Oil-Cholecalciferol (FISH OIL + D3) 1000-1000 MG-UNIT CAPS Take 1,000 mg by mouth daily.     . folic acid (FOLVITE) [id number] MCG tablet Take 800 mcg by mouth daily.     . hydroxyurea (HYDREA) 500 MG capsule TAKE 1 CAPSULE (500 MG TOTAL) BY MOUTH DAILY. MAY TAKE WITH FOOD TO MINIMIZE GI SIDE EFFECTS. 30 capsule 2  . L-Theanine 100 MG CAPS Take 100 mg by mouth  at bedtime.    Marland Kitchen MAGNESIUM CITRATE PO Take 200 mg by mouth daily.    . Probiotic Product (PROBIOTIC DAILY PO) Take 1 tablet by mouth daily.    Marland Kitchen tiZANidine (ZANAFLEX) 4 MG tablet Take by mouth 3 (three) times daily.   11   No current facility-administered medications for this visit.     OBJECTIVE: Vitals:   01/12/17 1428  BP: 116/74  Pulse: 84  Resp: 18  Temp: 98.5 F (36.9 C)     Body mass index is 22.29 kg/m.    ECOG FS:0 - Asymptomatic  General: Well-developed, well-nourished, no acute distress. Eyes: Pink conjunctiva, anicteric sclera. Lungs: Clear to auscultation bilaterally. Heart: Regular rate and rhythm. No rubs, murmurs, or gallops. Abdomen: Soft, nontender, nondistended. No organomegaly noted, normoactive bowel sounds. Musculoskeletal: No edema, cyanosis, or clubbing. Neuro: Alert, answering all questions appropriately. Cranial nerves grossly intact. Skin: No rashes or petechiae noted. Psych: Normal affect.    LAB RESULTS:  Lab Results  Component Value Date   NA 138 05/19/2016   K 4.0 05/19/2016   CL 108 05/19/2016   CO2 23 05/19/2016   GLUCOSE 102 (H) 05/19/2016   BUN 14 05/19/2016   CREATININE 0.58 05/19/2016   CALCIUM 8.7 (L) 05/19/2016   PROT 6.8 05/19/2016   ALBUMIN 4.2 05/19/2016   AST 26 05/19/2016   ALT 20 05/19/2016   ALKPHOS 107 05/19/2016   BILITOT 1.2 05/19/2016   GFRNONAA >60 05/19/2016   GFRAA >60 05/19/2016    Lab Results  Component Value Date   WBC 11.5 (H) 01/12/2017   NEUTROABS 9.0 (H) 01/12/2017   HGB 13.9 01/12/2017   HCT 41.2 01/12/2017   MCV 104.1 (H) 01/12/2017   PLT 219 01/12/2017   Lab Results  Component Value Date   IRON 58 01/12/2017   TIBC 312 01/12/2017   IRONPCTSAT 19 01/12/2017    Lab Results  Component Value Date   FERRITIN 292 01/12/2017     STUDIES: Mm Screening Breast Tomo Bilateral  Result Date: 12/22/2016 CLINICAL DATA:  Screening. EXAM: 2D DIGITAL SCREENING BILATERAL MAMMOGRAM WITH CAD AND  ADJUNCT TOMO COMPARISON:  Previous exam(s). ACR Breast Density Category c: The breast tissue is heterogeneously dense, which may obscure small masses. FINDINGS: There are no findings suspicious for malignancy. Images were processed with CAD. IMPRESSION: No mammographic evidence of malignancy. A result letter of this screening mammogram will be mailed directly to the patient. RECOMMENDATION: Screening mammogram in one year. (Code:SM-B-01Y) BI-RADS CATEGORY  1: Negative. Electronically Signed   By: Lillia Mountain M.D.   On: 12/22/2016 12:18    ASSESSMENT: JAK-2 positive, polycythemia vera, iron deficiency, heterozygous for hemochromatosis gene mutation H63D   PLAN:    1. Polycythemia vera: JAK-2 mutation is positive. She does not require phlebotomy, but would consider in the future if necessary. Patient's hemoglobin is currently within normal limits. Continue Hydrea 500 mg daily. Return to clinic in 3 months for repeat laboratory work and further evaluation. 2. Leukocytosis: Resolved. Likely related to P. vera.  BCR/ABL was negative for underlying  CML. Her peripheral blood flow cytometry did reveal approximately 1% myeloblasts.  Monitor.  Patient may need a bone marrow biopisy in the future for further evaluation, this is not necessary at this point. 3. Hemochromatosis: Patient's ferritin is within normal limits today. No phlebotomy is necessary at this time, but patient may require in the future. Continue to monitor closely. 4. Elevated MCV: Secondary to Hydrea.  5. Insomia: Discussed 4-7-8 breathing technique at bedtime: breathe in to count of 4, hold breath for count of 7, exhale for count of 8; do 3-5 times for letting go of overactive thoughts. 6. Fatigue: Discussed energizing breathing techniques; provided breath ratio chart handout outlining relaxing, balancing, and energizing breathing techniques.   Patient expressed understanding and was in agreement with this plan. She also understands that She  can call clinic at any time with any questions, concerns, or complaints.   Lucendia Herrlich, NP  01/12/17 4:29 PM  Patient was seen and evaluated independently and I agree with the assessment and plan as dictated above.  Lloyd Huger, MD 01/16/17 8:37 AM

## 2017-01-12 ENCOUNTER — Inpatient Hospital Stay: Payer: Medicare Other | Attending: Oncology

## 2017-01-12 ENCOUNTER — Inpatient Hospital Stay (HOSPITAL_BASED_OUTPATIENT_CLINIC_OR_DEPARTMENT_OTHER): Payer: Medicare Other | Admitting: Oncology

## 2017-01-12 ENCOUNTER — Inpatient Hospital Stay: Payer: Medicare Other

## 2017-01-12 VITALS — BP 116/74 | HR 84 | Temp 98.5°F | Resp 18 | Wt 142.3 lb

## 2017-01-12 DIAGNOSIS — D45 Polycythemia vera: Secondary | ICD-10-CM | POA: Diagnosis not present

## 2017-01-12 DIAGNOSIS — Z8612 Personal history of poliomyelitis: Secondary | ICD-10-CM | POA: Insufficient documentation

## 2017-01-12 DIAGNOSIS — R5383 Other fatigue: Secondary | ICD-10-CM | POA: Diagnosis not present

## 2017-01-12 DIAGNOSIS — R269 Unspecified abnormalities of gait and mobility: Secondary | ICD-10-CM | POA: Diagnosis not present

## 2017-01-12 DIAGNOSIS — R531 Weakness: Secondary | ICD-10-CM

## 2017-01-12 DIAGNOSIS — E0590 Thyrotoxicosis, unspecified without thyrotoxic crisis or storm: Secondary | ICD-10-CM | POA: Diagnosis not present

## 2017-01-12 DIAGNOSIS — Z1231 Encounter for screening mammogram for malignant neoplasm of breast: Secondary | ICD-10-CM | POA: Insufficient documentation

## 2017-01-12 DIAGNOSIS — R51 Headache: Secondary | ICD-10-CM | POA: Insufficient documentation

## 2017-01-12 DIAGNOSIS — Z79899 Other long term (current) drug therapy: Secondary | ICD-10-CM

## 2017-01-12 DIAGNOSIS — I471 Supraventricular tachycardia: Secondary | ICD-10-CM | POA: Diagnosis not present

## 2017-01-12 LAB — CBC WITH DIFFERENTIAL/PLATELET
Basophils Absolute: 0.1 10*3/uL (ref 0–0.1)
Basophils Relative: 1 %
EOS PCT: 3 %
Eosinophils Absolute: 0.3 10*3/uL (ref 0–0.7)
HEMATOCRIT: 41.2 % (ref 35.0–47.0)
Hemoglobin: 13.9 g/dL (ref 12.0–16.0)
LYMPHS PCT: 15 %
Lymphs Abs: 1.7 10*3/uL (ref 1.0–3.6)
MCH: 35.1 pg — ABNORMAL HIGH (ref 26.0–34.0)
MCHC: 33.7 g/dL (ref 32.0–36.0)
MCV: 104.1 fL — AB (ref 80.0–100.0)
MONO ABS: 0.4 10*3/uL (ref 0.2–0.9)
MONOS PCT: 4 %
NEUTROS ABS: 9 10*3/uL — AB (ref 1.4–6.5)
Neutrophils Relative %: 77 %
Platelets: 219 10*3/uL (ref 150–440)
RBC: 3.96 MIL/uL (ref 3.80–5.20)
RDW: 18.9 % — AB (ref 11.5–14.5)
WBC: 11.5 10*3/uL — ABNORMAL HIGH (ref 3.6–11.0)

## 2017-01-12 LAB — FERRITIN: FERRITIN: 292 ng/mL (ref 11–307)

## 2017-01-12 LAB — IRON AND TIBC
IRON: 58 ug/dL (ref 28–170)
Saturation Ratios: 19 % (ref 10.4–31.8)
TIBC: 312 ug/dL (ref 250–450)
UIBC: 254 ug/dL

## 2017-01-12 NOTE — Progress Notes (Signed)
Patient is here for follow up, she has no major complaints but she does mention some tiredness.

## 2017-03-02 ENCOUNTER — Encounter: Payer: Medicare Other | Admitting: Physical Medicine & Rehabilitation

## 2017-03-28 ENCOUNTER — Other Ambulatory Visit: Payer: Self-pay | Admitting: *Deleted

## 2017-03-28 MED ORDER — HYDROXYUREA 500 MG PO CAPS: ORAL_CAPSULE | ORAL | 2 refills | Status: DC

## 2017-04-12 ENCOUNTER — Other Ambulatory Visit: Payer: Self-pay

## 2017-04-12 DIAGNOSIS — D45 Polycythemia vera: Secondary | ICD-10-CM

## 2017-04-12 NOTE — Progress Notes (Signed)
[place]  [phone]) [phone] Fax:(336) [phone]  ID: Jeryl Columbia OB: [birth year]  MR#: [id number]  TMH#:[id number]  Patient Care Team: Idelle Crouch, MD as PCP - General (Internal Medicine)  CHIEF COMPLAINT: JAK-2 positive, polycythemia vera, iron deficiency, heterozygous for hemochromatosis gene mutation H63D   INTERVAL HISTORY: Patient returns to clinic today for further evaluation and laboratory work. She continues to tolerate Hydrea well without significant side effects. She currently feels well and is asymptomatic. She has no neurologic complaints. She denies any fevers, night sweats, or weight loss. She has no chest pain or shortness of breath. She denies any nausea, vomiting, constipation, or diarrhea. She has no urinary complaints. She reports occasional insomnia. Patient offers no other specific complaints today.  REVIEW OF SYSTEMS:   Review of Systems  Constitutional: Positive for malaise/fatigue. Negative for fever and weight loss.  HENT: Negative for congestion and sinus pain.   Respiratory: Negative.  Negative for cough and shortness of breath.   Cardiovascular: Negative.  Negative for chest pain and leg swelling.  Gastrointestinal: Negative.  Negative for abdominal pain, constipation, diarrhea, nausea and vomiting.  Genitourinary: Negative.   Musculoskeletal: Negative.   Neurological: Negative.  Negative for weakness and headaches.  Endo/Heme/Allergies: Does not bruise/bleed easily.  Psychiatric/Behavioral: The patient has insomnia. The patient is not nervous/anxious.     As per HPI. Otherwise, a complete review of systems is negative.  PAST MEDICAL HISTORY: Past Medical History:  Diagnosis Date  . Abnormality of gait 03/19/2016  . Hyperthyroidism   . Polio   . PSVT (paroxysmal supraventricular tachycardia) ([place])     PAST SURGICAL HISTORY: Past Surgical History:  Procedure Laterality Date  . BREAST BIOPSY Right 1999   EXCISIONAL - NEG  . BREAST BIOPSY Right 2012   EXCISIONAL - NEG  . BREAST SURGERY     breast biopsies  . COLONOSCOPY WITH PROPOFOL N/A 07/04/2015   Procedure: COLONOSCOPY WITH PROPOFOL;  Surgeon: Manya Silvas, MD;  Location: [facility] ENDOSCOPY;  Service: Endoscopy;  Laterality: N/A;  . ELECTROPHYSIOLOGIC STUDY N/A 12/16/2015   Procedure: SVT Ablation;  Surgeon: Evans Lance, MD;  Location: [place] [facility] LAB;  Service: Cardiovascular;  Laterality: N/A;  . TONSILLECTOMY      FAMILY HISTORY: Reviewed and unchanged. No reported history of malignancy or chronic disease.     ADVANCED DIRECTIVES:    HEALTH MAINTENANCE: Social History  Substance Use Topics  . Smoking status: Never Smoker  . Smokeless tobacco: Never Used  . Alcohol use No     Colonoscopy:  PAP:  Bone density:  Lipid panel:  Allergies  Allergen Reactions  . Bee Venom Anaphylaxis  . Caffeine     Fever, chills, withdrawal symptoms    Current Outpatient Prescriptions  Medication Sig Dispense Refill  . ARMOUR THYROID 60 MG tablet Take 60 mg by mouth daily.  4  . calcium carbonate (OSCAL) 1500 (600 CA) MG TABS tablet Take 1,200 mg by mouth 2 (two) times a week.     . cholecalciferol (VITAMIN D) 1000 UNITS tablet Take 1,000 Units by mouth daily.     . Cyanocobalamin (RA VITAMIN B-12 TR) 1000 MCG TBCR Take 1,000 mcg by mouth daily.     Marland Kitchen DHEA 25 MG CAPS Take 1 capsule by mouth 2 (two) times a week.    . Ergocalciferol (VITAMIN D2) 400 units TABS Take 400 mg by mouth.    . Fish Oil-Cholecalciferol (FISH OIL + D3) 1000-1000 MG-UNIT CAPS Take 1,000 mg  by mouth daily.     . folic acid (FOLVITE) 675 MCG tablet Take 800 mcg by mouth daily.     . hydroxyurea (HYDREA) 500 MG capsule TAKE 1 CAPSULE (500 MG TOTAL) BY MOUTH DAILY. MAY TAKE WITH FOOD TO MINIMIZE GI SIDE EFFECTS. 30 capsule 2  . L-Theanine 100 MG CAPS Take 100 mg by mouth at bedtime.    Marland Kitchen MAGNESIUM CITRATE PO Take 200 mg by mouth daily.    . Probiotic Product  (PROBIOTIC DAILY PO) Take 1 tablet by mouth daily.    Marland Kitchen tiZANidine (ZANAFLEX) 4 MG tablet Take by mouth 3 (three) times daily.   11   No current facility-administered medications for this visit.     OBJECTIVE: Vitals:   04/13/17 1334  BP: 115/77  Pulse: 80  Temp: 99.5 F (37.5 C)     Body mass index is 22.65 kg/m.    ECOG FS:0 - Asymptomatic  General: Well-developed, well-nourished, no acute distress. Eyes: Pink conjunctiva, anicteric sclera. Lungs: Clear to auscultation bilaterally. Heart: Regular rate and rhythm. No rubs, murmurs, or gallops. Abdomen: Soft, nontender, nondistended. No organomegaly noted, normoactive bowel sounds. Musculoskeletal: No edema, cyanosis, or clubbing. Neuro: Alert, answering all questions appropriately. Cranial nerves grossly intact. Skin: No rashes or petechiae noted. Psych: Normal affect.    LAB RESULTS:  Lab Results  Component Value Date   NA 138 05/19/2016   K 4.0 05/19/2016   CL 108 05/19/2016   CO2 23 05/19/2016   GLUCOSE 102 (H) 05/19/2016   BUN 14 05/19/2016   CREATININE 0.58 05/19/2016   CALCIUM 8.7 (L) 05/19/2016   PROT 6.8 05/19/2016   ALBUMIN 4.2 05/19/2016   AST 26 05/19/2016   ALT 20 05/19/2016   ALKPHOS 107 05/19/2016   BILITOT 1.2 05/19/2016   GFRNONAA >60 05/19/2016   GFRAA >60 05/19/2016    Lab Results  Component Value Date   WBC 11.3 (H) 04/13/2017   NEUTROABS 9.2 (H) 04/13/2017   HGB 13.7 04/13/2017   HCT 41.1 04/13/2017   MCV 101.8 (H) 04/13/2017   PLT 229 04/13/2017   Lab Results  Component Value Date   IRON 58 01/12/2017   TIBC 312 01/12/2017   IRONPCTSAT 19 01/12/2017    Lab Results  Component Value Date   FERRITIN 292 01/12/2017     STUDIES: No results found.  ASSESSMENT: JAK-2 positive, polycythemia vera, iron deficiency, heterozygous for hemochromatosis gene mutation H63D   PLAN:    1. Polycythemia vera: JAK-2 mutation is positive. She does not require phlebotomy, but would consider  in the future if necessary. Patient's hemoglobin continues to be within normal limits. Continue Hydrea 500 mg daily. Return to clinic in 3 months for repeat laboratory work only and then in 6 months for laboratory work and further evaluation. 2. Leukocytosis: Mild. Likely related to P. vera.  BCR/ABL was negative for underlying CML. Her peripheral blood flow cytometry did reveal approximately 1% myeloblasts.  Monitor.  Patient may need a bone marrow biopsy in the future for further evaluation, this is not necessary at this point. 3. Hemochromatosis: Patient's ferritin is within normal limits today. No phlebotomy is necessary at this time, but patient may require in the future. Continue to monitor closely. 4. Elevated MCV: Secondary to Hydrea.   Patient expressed understanding and was in agreement with this plan. She also understands that She can call clinic at any time with any questions, concerns, or complaints.    Lloyd Huger, MD 04/13/17 1:42 PM

## 2017-04-13 ENCOUNTER — Inpatient Hospital Stay: Payer: Medicare Other

## 2017-04-13 ENCOUNTER — Inpatient Hospital Stay: Payer: Medicare Other | Attending: Oncology | Admitting: Oncology

## 2017-04-13 VITALS — BP 115/77 | HR 80 | Temp 99.5°F | Wt 144.6 lb

## 2017-04-13 DIAGNOSIS — I471 Supraventricular tachycardia: Secondary | ICD-10-CM | POA: Diagnosis not present

## 2017-04-13 DIAGNOSIS — D45 Polycythemia vera: Secondary | ICD-10-CM | POA: Diagnosis present

## 2017-04-13 DIAGNOSIS — D72829 Elevated white blood cell count, unspecified: Secondary | ICD-10-CM | POA: Insufficient documentation

## 2017-04-13 DIAGNOSIS — R5383 Other fatigue: Secondary | ICD-10-CM | POA: Diagnosis not present

## 2017-04-13 DIAGNOSIS — Z79899 Other long term (current) drug therapy: Secondary | ICD-10-CM | POA: Diagnosis not present

## 2017-04-13 DIAGNOSIS — R269 Unspecified abnormalities of gait and mobility: Secondary | ICD-10-CM | POA: Insufficient documentation

## 2017-04-13 DIAGNOSIS — R531 Weakness: Secondary | ICD-10-CM | POA: Insufficient documentation

## 2017-04-13 DIAGNOSIS — Z8612 Personal history of poliomyelitis: Secondary | ICD-10-CM | POA: Diagnosis not present

## 2017-04-13 DIAGNOSIS — E0590 Thyrotoxicosis, unspecified without thyrotoxic crisis or storm: Secondary | ICD-10-CM | POA: Insufficient documentation

## 2017-04-13 LAB — CBC WITH DIFFERENTIAL/PLATELET
BASOS PCT: 1 %
Basophils Absolute: 0.1 10*3/uL (ref 0–0.1)
EOS ABS: 0.3 10*3/uL (ref 0–0.7)
Eosinophils Relative: 3 %
HCT: 41.1 % (ref 35.0–47.0)
Hemoglobin: 13.7 g/dL (ref 12.0–16.0)
LYMPHS ABS: 1.4 10*3/uL (ref 1.0–3.6)
Lymphocytes Relative: 12 %
MCH: 34 pg (ref 26.0–34.0)
MCHC: 33.4 g/dL (ref 32.0–36.0)
MCV: 101.8 fL — AB (ref 80.0–100.0)
Monocytes Absolute: 0.4 10*3/uL (ref 0.2–0.9)
Monocytes Relative: 3 %
NEUTROS ABS: 9.2 10*3/uL — AB (ref 1.4–6.5)
Neutrophils Relative %: 81 %
PLATELETS: 229 10*3/uL (ref 150–440)
RBC: 4.04 MIL/uL (ref 3.80–5.20)
RDW: 19.5 % — AB (ref 11.5–14.5)
WBC: 11.3 10*3/uL — ABNORMAL HIGH (ref 3.6–11.0)

## 2017-04-13 LAB — IRON AND TIBC
Iron: 70 ug/dL (ref 28–170)
SATURATION RATIOS: 24 % (ref 10.4–31.8)
TIBC: 295 ug/dL (ref 250–450)
UIBC: 225 ug/dL

## 2017-04-13 LAB — FERRITIN: Ferritin: 188 ng/mL (ref 11–307)

## 2017-04-13 NOTE — Progress Notes (Signed)
Patient denies pain or discomfort at this time.   

## 2017-05-25 ENCOUNTER — Encounter: Payer: Self-pay | Admitting: Physical Medicine & Rehabilitation

## 2017-05-25 ENCOUNTER — Encounter: Payer: Medicare Other | Attending: Physical Medicine & Rehabilitation | Admitting: Physical Medicine & Rehabilitation

## 2017-05-25 VITALS — BP 131/79 | HR 79

## 2017-05-25 DIAGNOSIS — Z9889 Other specified postprocedural states: Secondary | ICD-10-CM | POA: Diagnosis not present

## 2017-05-25 DIAGNOSIS — Z803 Family history of malignant neoplasm of breast: Secondary | ICD-10-CM | POA: Diagnosis not present

## 2017-05-25 DIAGNOSIS — I471 Supraventricular tachycardia: Secondary | ICD-10-CM | POA: Diagnosis not present

## 2017-05-25 DIAGNOSIS — E039 Hypothyroidism, unspecified: Secondary | ICD-10-CM | POA: Diagnosis not present

## 2017-05-25 DIAGNOSIS — M549 Dorsalgia, unspecified: Secondary | ICD-10-CM | POA: Insufficient documentation

## 2017-05-25 DIAGNOSIS — R269 Unspecified abnormalities of gait and mobility: Secondary | ICD-10-CM | POA: Diagnosis not present

## 2017-05-25 DIAGNOSIS — E0590 Thyrotoxicosis, unspecified without thyrotoxic crisis or storm: Secondary | ICD-10-CM | POA: Insufficient documentation

## 2017-05-25 DIAGNOSIS — A6920 Lyme disease, unspecified: Secondary | ICD-10-CM | POA: Insufficient documentation

## 2017-05-25 DIAGNOSIS — G43909 Migraine, unspecified, not intractable, without status migrainosus: Secondary | ICD-10-CM | POA: Diagnosis not present

## 2017-05-25 DIAGNOSIS — G14 Postpolio syndrome: Secondary | ICD-10-CM | POA: Diagnosis present

## 2017-05-25 NOTE — Progress Notes (Signed)
Subjective:    Patient ID: Priscilla Berry, female    DOB: 1946/08/08, 71 y.o.   MRN: [id number]  HPI 71 year old female with PMH of migraines, Lymes disease, hypothyroidism, heart ablation, and polio with b/l LE weakness presents for management of post polio syndrome (~1990).  Initially stated: She has had polio since age 66. She is a Corporate treasurer by profession.  Her headaches and under control. The lymes disease was treated.  She notes that she has fallen nearly her entire life.  Recently, in February she fell without her braces.  She has weakness in b/l LE R>L.  She had associated back pain and went to a chiropractor, who adjusted her back.  She wears b/l AFOs for mobility.  She is also using a cane now.  She has a very active lifestyle and is concerned about falls.  Since using singlecane/walker she has not fallen (~2 months).  Prior to this she used to fall once every 2 months.  She denies pain.   Last clinic visit 03/19/16.  Since last visit, she states she fell and broke her right tib/fib 03/16/17.  She fell due to over activity and over exertion caring for parents. She continues to use cane in community.  She would like reevaluation for bracing.    Pain Inventory Average Pain 0 Pain Right Now 0 My pain is no pain  In the last 24 hours, has pain interfered with the following? General activity 0 Relation with others 0 Enjoyment of life 0 What TIME of day is your pain at its worst? no pain Sleep (in general) Fair  Pain is worse with: no pain Pain improves with: no pain Relief from Meds: no pain  Mobility walk with assistance use a cane use a walker how many minutes can you walk? several hours with braces ability to climb steps?  no do you drive?  yes  Function retired  Neuro/Psych trouble walking  Prior Studies Any changes since last visit?  no  Physicians involved in your care Any changes since last visit?  no   Family History  Problem Relation Age of Onset  . Breast  cancer Neg Hx    Social History   Social History  . Marital status: Married    Spouse name: N/A  . Number of children: N/A  . Years of education: N/A   Social History Main Topics  . Smoking status: Never Smoker  . Smokeless tobacco: Never Used  . Alcohol use No  . Drug use: Unknown  . Sexual activity: Not Asked   Other Topics Concern  . None   Social History Narrative  . None   Past Surgical History:  Procedure Laterality Date  . BREAST BIOPSY Right 1999   EXCISIONAL - NEG  . BREAST BIOPSY Right 2012   EXCISIONAL - NEG  . BREAST SURGERY     breast biopsies  . COLONOSCOPY WITH PROPOFOL N/A 07/04/2015   Procedure: COLONOSCOPY WITH PROPOFOL;  Surgeon: Manya Silvas, MD;  Location: [facility] ENDOSCOPY;  Service: Endoscopy;  Laterality: N/A;  . ELECTROPHYSIOLOGIC STUDY N/A 12/16/2015   Procedure: SVT Ablation;  Surgeon: Evans Lance, MD;  Location: [place] [facility];  Service: Cardiovascular;  Laterality: N/A;  . TONSILLECTOMY     Past Medical History:  Diagnosis Date  . Abnormality of gait 03/19/2016  . Hyperthyroidism   . Polio   . PSVT (paroxysmal supraventricular tachycardia) ([facility])    BP 131/79   Pulse 79   SpO2 97%  Opioid Risk Score:   Fall Risk Score:  `1  Depression screen PHQ 2/9  Depression screen PHQ 2/9 03/19/2016  Decreased Interest 0  Down, Depressed, Hopeless 0  PHQ - 2 Score 0  Altered sleeping 1  Tired, decreased energy 1  Change in appetite 0  Feeling bad or failure about yourself  0  Trouble concentrating 0  Moving slowly or fidgety/restless 0  Suicidal thoughts 0  PHQ-9 Score 2  Difficult doing work/chores Not difficult at all   Review of Systems  Musculoskeletal: Positive for gait problem.  Neurological: Positive for weakness.  All other systems reviewed and are negative.     Objective:   Physical Exam HENT: Normocephalic, Atraumatic Eyes: EOMI, No discharge.  Cardio: RRR. No JVD.  Pulm: B/l clear to auscultation.  Effort  normal Abd: Soft, BS+ MSK:  Gait slowed with b/l LE external rotation with AFOs, foot slaps without AFOs   No TTP.   No edema. Neuro:   Sensation intact to light touch in all UE and LE dermatomes  Strength  5/5 in all UE myotomes    4+/5 left hip flexion, 5/5 knee extension, 1/5 ankle dorsi/plantar flexion    4+/5 right hip flexion, 3+/5 knee extension, 1/5 ankle dorsi/plantar flexion Skin: Warm and Dry.  Intact.     Assessment & Plan:  71 year old female with PMH of migraines, Lymes disease, hypothyroidism, heart ablation, and polio with b/l LE weakness presents for follow up for management of post polio syndrome (~1990).  1. Post-polio syndrome  She denies pain  She has had current AFOs for 15-20 years, but states she is comfortable with them  Comfortable using cane/walker in community   Pt made changes to home environment with changes to (rails, rugs, carpets)  Cont current AFOs with cane/walker, will consider replacement AFOs with ?right KFO if necessary along with change in cane and walker.    Pt with recent fall with right tib/fib fx, but this happened after several days/weeks of over exertion due to care for parents.   Will refer to PT for gait training  Encouraged periods of rest  2. Abnormality of gait  See #1  >25 minutes spent with patient with >20 minutes in counseling regarding brace option, strengthening, rest breaks, and Polio

## 2017-06-06 ENCOUNTER — Ambulatory Visit: Payer: Medicare Other | Admitting: Rehabilitation

## 2017-06-23 ENCOUNTER — Other Ambulatory Visit: Payer: Self-pay | Admitting: *Deleted

## 2017-06-23 MED ORDER — HYDROXYUREA 500 MG PO CAPS: ORAL_CAPSULE | ORAL | 0 refills | Status: DC

## 2017-07-06 ENCOUNTER — Encounter: Payer: Self-pay | Admitting: Physical Medicine & Rehabilitation

## 2017-07-06 ENCOUNTER — Telehealth: Payer: Self-pay

## 2017-07-06 ENCOUNTER — Encounter: Payer: Medicare Other | Attending: Physical Medicine & Rehabilitation | Admitting: Physical Medicine & Rehabilitation

## 2017-07-06 VITALS — BP 124/74 | HR 81 | Resp 14

## 2017-07-06 DIAGNOSIS — M549 Dorsalgia, unspecified: Secondary | ICD-10-CM | POA: Diagnosis not present

## 2017-07-06 DIAGNOSIS — E039 Hypothyroidism, unspecified: Secondary | ICD-10-CM | POA: Diagnosis not present

## 2017-07-06 DIAGNOSIS — A6920 Lyme disease, unspecified: Secondary | ICD-10-CM | POA: Diagnosis not present

## 2017-07-06 DIAGNOSIS — G14 Postpolio syndrome: Secondary | ICD-10-CM | POA: Insufficient documentation

## 2017-07-06 DIAGNOSIS — E0590 Thyrotoxicosis, unspecified without thyrotoxic crisis or storm: Secondary | ICD-10-CM | POA: Insufficient documentation

## 2017-07-06 DIAGNOSIS — G43909 Migraine, unspecified, not intractable, without status migrainosus: Secondary | ICD-10-CM | POA: Insufficient documentation

## 2017-07-06 DIAGNOSIS — Z803 Family history of malignant neoplasm of breast: Secondary | ICD-10-CM | POA: Diagnosis not present

## 2017-07-06 DIAGNOSIS — R269 Unspecified abnormalities of gait and mobility: Secondary | ICD-10-CM | POA: Insufficient documentation

## 2017-07-06 DIAGNOSIS — I471 Supraventricular tachycardia: Secondary | ICD-10-CM | POA: Diagnosis not present

## 2017-07-06 DIAGNOSIS — Z9889 Other specified postprocedural states: Secondary | ICD-10-CM | POA: Diagnosis not present

## 2017-07-06 NOTE — Progress Notes (Signed)
Subjective:    Patient ID: Priscilla Berry, female    DOB: 1946-11-16, 71 y.o.   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  Initially stated: She has had polio since age 74. She is a Corporate treasurer by profession.  Her headaches and under control. The lymes disease was treated.  She notes that she has fallen nearly her entire life.  Recently, in February she fell without her braces.  She has weakness in b/l LE R>L.  She had associated back pain and went to a chiropractor, who adjusted her back.  She wears b/l AFOs for mobility.  She is also using a cane now.  She has a very active lifestyle and is concerned about falls.  Since using singlecane/walker she has not fallen (~2 months).  Prior to this she used to fall once every 2 months.  She denies pain.   Last clinic visit 05/25/17.  Since last visit, she denies falls.  Pt did not follow up with PT.  She states she discussed it with her husband and they decided she did not need it.  Overall, she states she is doing great.    Pain Inventory Average Pain 0 Pain Right Now 0 My pain is no pain  In the last 24 hours, has pain interfered with the following? General activity 0 Relation with others 0 Enjoyment of life 0 What TIME of day is your pain at its worst? no pain Sleep (in general) Good  Pain is worse with: no pain Pain improves with: no pain Relief from Meds: no pain  Mobility walk with assistance use a cane how many minutes can you walk? several hours with braces ability to climb steps?  no do you drive?  yes  Function retired  Neuro/Psych trouble walking  Prior Studies Any changes since last visit?  no  Physicians involved in your care Any changes since last visit?  no   Family History  Problem Relation Age of Onset  . Breast cancer Neg Hx    Social History   Social  History  . Marital status: Married    Spouse name: N/A  . Number of children: N/A  . Years of education: N/A   Social History Main Topics  . Smoking status: Never Smoker  . Smokeless tobacco: Never Used  . Alcohol use No  . Drug use: Unknown  . Sexual activity: Not Asked   Other Topics Concern  . None   Social History Narrative  . None   Past Surgical History:  Procedure Laterality Date  . BREAST BIOPSY Right 1999   EXCISIONAL - NEG  . BREAST BIOPSY Right 2012   EXCISIONAL - NEG  . BREAST SURGERY     breast biopsies  . COLONOSCOPY WITH PROPOFOL N/A 07/04/2015   Procedure: COLONOSCOPY WITH PROPOFOL;  Surgeon: Manya Silvas, MD;  Location: [facility] ENDOSCOPY;  Service: Endoscopy;  Laterality: N/A;  . ELECTROPHYSIOLOGIC STUDY N/A 12/16/2015   Procedure: SVT Ablation;  Surgeon: Evans Lance, MD;  Location: [place] [facility];  Service: Cardiovascular;  Laterality: N/A;  . TONSILLECTOMY     Past Medical History:  Diagnosis Date  . Abnormality of gait 03/19/2016  . Hyperthyroidism   . Polio   . PSVT (paroxysmal supraventricular tachycardia) (HCC)    BP 124/74 (BP Location: Right Arm, Patient Position: Sitting, Cuff Size: Normal)   Pulse  81   Resp 14   SpO2 96%   Opioid Risk Score:   Fall Risk Score:  `1  Depression screen PHQ 2/9  Depression screen PHQ 2/9 03/19/2016  Decreased Interest 0  Down, Depressed, Hopeless 0  PHQ - 2 Score 0  Altered sleeping 1  Tired, decreased energy 1  Change in appetite 0  Feeling bad or failure about yourself  0  Trouble concentrating 0  Moving slowly or fidgety/restless 0  Suicidal thoughts 0  PHQ-9 Score 2  Difficult doing work/chores Not difficult at all   Review of Systems  Musculoskeletal: Positive for gait problem.  Neurological: Positive for weakness.  All other systems reviewed and are negative.     Objective:   Physical Exam HENT: Normocephalic, Atraumatic Eyes: EOMI, No discharge.  Cardio: RRR. No JVD.  Pulm:  B/l clear to auscultation.  Effort normal Abd: Soft, BS+ MSK:  Gait slowed with b/l LE external rotation with AFOs, foot slaps without AFOs   No TTP.   No edema. Neuro:   Sensation intact to light touch in all UE and LE dermatomes  Strength  5/5 in all UE myotomes    4+/5 left hip flexion, 5/5 knee extension, 1/5 ankle dorsi/plantar flexion    4+/5 right hip flexion, 3+/5 knee extension, 1/5 ankle dorsi/plantar flexion Skin: Warm and Dry.  Intact.     Assessment & Plan:  71 year old female with PMH of migraines, Lymes disease, hypothyroidism, heart ablation, and polio with b/l LE weakness presents for follow up for management of post polio syndrome (~1990).  1. Post-polio syndrome  She denies pain  She has had current AFOs for 15-20 years, but states she is comfortable with them  Comfortable using cane/walker in community   Pt made changes to home environment with changes to (rails, rugs, carpets)  Cont current AFOs with cane/walker, will consider replacement AFOs with ?right KFO if necessary along with change in cane and walker.    Pt with recent fall with right tib/fib fx, but this happened after several days/weeks of over exertion due to care for parents.   Refered to PT for gait training, but pt and wife decided she did not need it  Encouraged periods of rest  2. Abnormality of gait  See #1  >25 minutes spent with patient with >20 minutes counseling and discussing bracing

## 2017-07-13 ENCOUNTER — Inpatient Hospital Stay: Payer: Medicare Other | Attending: Oncology

## 2017-07-13 DIAGNOSIS — D45 Polycythemia vera: Secondary | ICD-10-CM | POA: Diagnosis not present

## 2017-07-13 LAB — CBC WITH DIFFERENTIAL/PLATELET
Basophils Absolute: 0.2 10*3/uL — ABNORMAL HIGH (ref 0–0.1)
Basophils Relative: 1 %
Eosinophils Absolute: 0.3 10*3/uL (ref 0–0.7)
Eosinophils Relative: 2 %
HEMATOCRIT: 42.1 % (ref 35.0–47.0)
HEMOGLOBIN: 13.9 g/dL (ref 12.0–16.0)
LYMPHS ABS: 1.6 10*3/uL (ref 1.0–3.6)
LYMPHS PCT: 13 %
MCH: 33.1 pg (ref 26.0–34.0)
MCHC: 33.1 g/dL (ref 32.0–36.0)
MCV: 99.9 fL (ref 80.0–100.0)
MONOS PCT: 4 %
Monocytes Absolute: 0.5 10*3/uL (ref 0.2–0.9)
NEUTROS ABS: 9.8 10*3/uL — AB (ref 1.4–6.5)
Neutrophils Relative %: 80 %
Platelets: 253 10*3/uL (ref 150–440)
RBC: 4.22 MIL/uL (ref 3.80–5.20)
RDW: 20.2 % — ABNORMAL HIGH (ref 11.5–14.5)
WBC: 12.3 10*3/uL — ABNORMAL HIGH (ref 3.6–11.0)

## 2017-07-26 ENCOUNTER — Other Ambulatory Visit: Payer: Self-pay

## 2017-07-26 MED ORDER — HYDROXYUREA 500 MG PO CAPS: ORAL_CAPSULE | ORAL | 0 refills | Status: DC

## 2017-08-22 ENCOUNTER — Other Ambulatory Visit: Payer: Self-pay | Admitting: Oncology

## 2017-09-22 ENCOUNTER — Other Ambulatory Visit: Payer: Self-pay | Admitting: Oncology

## 2017-10-12 ENCOUNTER — Other Ambulatory Visit: Payer: Medicare Other

## 2017-10-12 ENCOUNTER — Ambulatory Visit: Payer: Medicare Other | Admitting: Oncology

## 2017-10-17 NOTE — Progress Notes (Signed)
[place]  Telephone:(336) [phone] [phone]) [phone]  ID: Jeryl Columbia OB: [birth year]  MR#: [id number]  EHO#:[id number]  Patient Care Team: Idelle Crouch, MD as PCP - General (Internal Medicine)  CHIEF COMPLAINT: JAK-2 positive, polycythemia vera, iron deficiency, heterozygous for hemochromatosis gene mutation H63D   INTERVAL HISTORY: Patient returns to clinic today for further evaluation and laboratory work. She continues to tolerate Hydrea well without significant side effects. She currently feels well and is asymptomatic. She has no neurologic complaints. She denies any fevers, night sweats, or weight loss. She has no chest pain or shortness of breath. She denies any nausea, vomiting, constipation, or diarrhea. She has no urinary complaints. Patient offers specific complaints today.  REVIEW OF SYSTEMS:   Review of Systems  Constitutional: Negative for fever, malaise/fatigue and weight loss.  HENT: Negative for congestion and sinus pain.   Respiratory: Negative.  Negative for cough and shortness of breath.   Cardiovascular: Negative.  Negative for chest pain and leg swelling.  Gastrointestinal: Negative.  Negative for abdominal pain, constipation, diarrhea, nausea and vomiting.  Genitourinary: Negative.   Musculoskeletal: Negative.   Neurological: Negative.  Negative for weakness and headaches.  Endo/Heme/Allergies: Does not bruise/bleed easily.  Psychiatric/Behavioral: Negative.  The patient is not nervous/anxious and does not have insomnia.     As per HPI. Otherwise, a complete review of systems is negative.  PAST MEDICAL HISTORY: Past Medical History:  Diagnosis Date  . Abnormality of gait 03/19/2016  . Hyperthyroidism   . Polio   . PSVT (paroxysmal supraventricular tachycardia) ([place])     PAST SURGICAL HISTORY: Past Surgical History:  Procedure Laterality Date  . BREAST BIOPSY Right 1999   EXCISIONAL - NEG  . BREAST BIOPSY Right 2012   EXCISIONAL - NEG  . BREAST SURGERY     breast biopsies  . COLONOSCOPY WITH PROPOFOL N/A 07/04/2015   Procedure: COLONOSCOPY WITH PROPOFOL;  Surgeon: Manya Silvas, MD;  Location: [facility] ENDOSCOPY;  Service: Endoscopy;  Laterality: N/A;  . ELECTROPHYSIOLOGIC STUDY N/A 12/16/2015   Procedure: SVT Ablation;  Surgeon: Evans Lance, MD;  Location: [place] [facility];  Service: Cardiovascular;  Laterality: N/A;  . TONSILLECTOMY      FAMILY HISTORY: Reviewed and unchanged. No reported history of malignancy or chronic disease.     ADVANCED DIRECTIVES:    HEALTH MAINTENANCE: Social History  Substance Use Topics  . Smoking status: Never Smoker  . Smokeless tobacco: Never Used  . Alcohol use No     Colonoscopy:  PAP:  Bone density:  Lipid panel:  Allergies  Allergen Reactions  . Bee Venom Anaphylaxis  . Caffeine     Fever, chills, withdrawal symptoms    Current Outpatient Prescriptions  Medication Sig Dispense Refill  . ARMOUR THYROID 60 MG tablet Take 60 mg by mouth daily.  4  . calcium carbonate (OSCAL) 1500 (600 CA) MG TABS tablet Take 1,200 mg by mouth 2 (two) times a week.     . cholecalciferol (VITAMIN D) 1000 units tablet Take 1,000 Units by mouth daily.    . Cyanocobalamin (RA VITAMIN B-12 TR) 1000 MCG TBCR Take 1,000 mcg by mouth daily.     Marland Kitchen DHEA 25 MG CAPS Take 1 capsule by mouth 2 (two) times a week.    . Fish Oil-Cholecalciferol (FISH OIL + D3) 1000-1000 MG-UNIT CAPS Take 1,000 mg by mouth daily.     . folic acid (FOLVITE) 370 MCG tablet Take 800 mcg by mouth daily.     Marland Kitchen  hydroxyurea (HYDREA) 500 MG capsule TAKE 1 CAPSULE (500 MG TOTAL) BY MOUTH DAILY. MAY TAKE WITH FOOD TO MINIMIZE GI SIDE EFFECTS. 30 capsule 0  . L-Theanine 100 MG CAPS Take 100 mg by mouth at bedtime.    Marland Kitchen MAGNESIUM CITRATE PO Take 200 mg by mouth daily.    . Probiotic Product (PROBIOTIC DAILY PO) Take 1 tablet by mouth daily.    . Riboflavin 400 MG CAPS Take by mouth.    Marland Kitchen tiZANidine  (ZANAFLEX) 4 MG tablet Take by mouth once as needed.   11   No current facility-administered medications for this visit.     OBJECTIVE: Vitals:   10/19/17 1413  BP: 120/77  Pulse: 85  Resp: 14  Temp: 98.8 F (37.1 C)     Body mass index is 22.55 kg/m.    ECOG FS:0 - Asymptomatic  General: Well-developed, well-nourished, no acute distress. Eyes: Pink conjunctiva, anicteric sclera. Lungs: Clear to auscultation bilaterally. Heart: Regular rate and rhythm. No rubs, murmurs, or gallops. Abdomen: Soft, nontender, nondistended. No organomegaly noted, normoactive bowel sounds. Musculoskeletal: No edema, cyanosis, or clubbing. Neuro: Alert, answering all questions appropriately. Cranial nerves grossly intact. Skin: No rashes or petechiae noted. Psych: Normal affect.    LAB RESULTS:  Lab Results  Component Value Date   NA 138 05/19/2016   K 4.0 05/19/2016   CL 108 05/19/2016   CO2 23 05/19/2016   GLUCOSE 102 (H) 05/19/2016   BUN 14 05/19/2016   CREATININE 0.58 05/19/2016   CALCIUM 8.7 (L) 05/19/2016   PROT 6.8 05/19/2016   ALBUMIN 4.2 05/19/2016   AST 26 05/19/2016   ALT 20 05/19/2016   ALKPHOS 107 05/19/2016   BILITOT 1.2 05/19/2016   GFRNONAA >60 05/19/2016   GFRAA >60 05/19/2016    Lab Results  Component Value Date   WBC 11.0 10/19/2017   NEUTROABS 8.6 (H) 10/19/2017   HGB 13.5 10/19/2017   HCT 41.2 10/19/2017   MCV 101.8 (H) 10/19/2017   PLT 215 10/19/2017   Lab Results  Component Value Date   IRON 70 04/13/2017   TIBC 295 04/13/2017   IRONPCTSAT 24 04/13/2017    Lab Results  Component Value Date   FERRITIN 188 04/13/2017     STUDIES: No results found.  ASSESSMENT: JAK-2 positive, polycythemia vera, iron deficiency, heterozygous for hemochromatosis gene mutation H63D   PLAN:    1. Polycythemia vera: JAK-2 mutation is positive. She does not require phlebotomy, but would consider in the future if necessary. Patient's hemoglobin continues to be  within normal limits. Continue Hydrea 500 mg daily. Return to clinic in 6 months for laboratory work and further evaluation. 2. Leukocytosis: Resolved.  BCR/ABL was negative for underlying CML. Her peripheral blood flow cytometry did reveal approximately 1% myeloblasts.  Monitor.  Patient may need a bone marrow biopsy in the future for further evaluation, this is not necessary at this point. 3. Hemochromatosis: Patient's ferritin is pending from today. No phlebotomy is necessary at this time, but patient may require in the future. Continue to monitor closely. 4. Elevated MCV: Secondary to Hydrea.   Patient expressed understanding and was in agreement with this plan. She also understands that She can call clinic at any time with any questions, concerns, or complaints.    Lloyd Huger, MD 10/19/17 2:39 PM

## 2017-10-19 ENCOUNTER — Inpatient Hospital Stay (HOSPITAL_BASED_OUTPATIENT_CLINIC_OR_DEPARTMENT_OTHER): Payer: Medicare Other | Admitting: Oncology

## 2017-10-19 ENCOUNTER — Inpatient Hospital Stay: Payer: Medicare Other | Attending: Oncology

## 2017-10-19 ENCOUNTER — Encounter: Payer: Self-pay | Admitting: Oncology

## 2017-10-19 ENCOUNTER — Inpatient Hospital Stay: Payer: Medicare Other

## 2017-10-19 VITALS — BP 120/77 | HR 85 | Temp 98.8°F | Resp 14 | Wt 144.0 lb

## 2017-10-19 DIAGNOSIS — Z79899 Other long term (current) drug therapy: Secondary | ICD-10-CM

## 2017-10-19 DIAGNOSIS — E0590 Thyrotoxicosis, unspecified without thyrotoxic crisis or storm: Secondary | ICD-10-CM | POA: Insufficient documentation

## 2017-10-19 DIAGNOSIS — D509 Iron deficiency anemia, unspecified: Secondary | ICD-10-CM | POA: Insufficient documentation

## 2017-10-19 DIAGNOSIS — I471 Supraventricular tachycardia: Secondary | ICD-10-CM | POA: Insufficient documentation

## 2017-10-19 DIAGNOSIS — D45 Polycythemia vera: Secondary | ICD-10-CM | POA: Insufficient documentation

## 2017-10-19 LAB — CBC WITH DIFFERENTIAL/PLATELET
Basophils Absolute: 0.1 10*3/uL (ref 0–0.1)
Basophils Relative: 1 %
EOS ABS: 0.3 10*3/uL (ref 0–0.7)
EOS PCT: 3 %
HCT: 41.2 % (ref 35.0–47.0)
Hemoglobin: 13.5 g/dL (ref 12.0–16.0)
LYMPHS ABS: 1.5 10*3/uL (ref 1.0–3.6)
Lymphocytes Relative: 14 %
MCH: 33.4 pg (ref 26.0–34.0)
MCHC: 32.8 g/dL (ref 32.0–36.0)
MCV: 101.8 fL — ABNORMAL HIGH (ref 80.0–100.0)
MONO ABS: 0.5 10*3/uL (ref 0.2–0.9)
Monocytes Relative: 4 %
Neutro Abs: 8.6 10*3/uL — ABNORMAL HIGH (ref 1.4–6.5)
Neutrophils Relative %: 78 %
PLATELETS: 215 10*3/uL (ref 150–440)
RBC: 4.05 MIL/uL (ref 3.80–5.20)
RDW: 20 % — AB (ref 11.5–14.5)
WBC: 11 10*3/uL (ref 3.6–11.0)

## 2017-10-19 NOTE — Progress Notes (Signed)
Patient here for follow up with labs today. She states that she is feeling well and denies having any pain. Medication list was updated today.

## 2017-10-20 ENCOUNTER — Other Ambulatory Visit: Payer: Self-pay | Admitting: *Deleted

## 2017-10-20 MED ORDER — HYDROXYUREA 500 MG PO CAPS: ORAL_CAPSULE | ORAL | 1 refills | Status: DC

## 2017-12-27 ENCOUNTER — Other Ambulatory Visit: Payer: Self-pay | Admitting: Internal Medicine

## 2017-12-27 DIAGNOSIS — Z1231 Encounter for screening mammogram for malignant neoplasm of breast: Secondary | ICD-10-CM

## 2018-01-06 ENCOUNTER — Ambulatory Visit
Admission: RE | Admit: 2018-01-06 | Discharge: 2018-01-06 | Disposition: A | Discharge status: Home / Self Care | Payer: Medicare Other | Source: Ambulatory Visit | Attending: Internal Medicine | Admitting: Internal Medicine

## 2018-01-06 DIAGNOSIS — Z1231 Encounter for screening mammogram for malignant neoplasm of breast: Secondary | ICD-10-CM | POA: Diagnosis present

## 2018-04-15 ENCOUNTER — Other Ambulatory Visit: Payer: Self-pay | Admitting: Oncology

## 2018-04-19 ENCOUNTER — Other Ambulatory Visit: Payer: Medicare Other

## 2018-04-19 ENCOUNTER — Ambulatory Visit: Payer: Medicare Other | Admitting: Oncology

## 2018-04-21 NOTE — Progress Notes (Signed)
[place]  Telephone:(336) [phone] Fax:(336) [phone]  ID: Jeryl Columbia OB: 1946-10-25  MR#: [id number]  WGY#:[id number]  Patient Care Team: Idelle Crouch, MD as PCP - General (Internal Medicine)  CHIEF COMPLAINT: JAK-2 positive, polycythemia vera, iron deficiency, heterozygous for hemochromatosis gene mutation H63D   INTERVAL HISTORY: Patient returns to clinic today for repeat laboratory work and further evaluation.  She continues to tolerate Hydrea well without significant side effects.  She currently feels well and is at her baseline. She has no neurologic complaints. She denies any fevers, night sweats, or weight loss. She has no chest pain or shortness of breath. She denies any nausea, vomiting, constipation, or diarrhea. She has no urinary complaints.  Patient offers no specific complaints today.  REVIEW OF SYSTEMS:   Review of Systems  Constitutional: Negative for fever, malaise/fatigue and weight loss.  HENT: Negative for congestion and sinus pain.   Respiratory: Negative.  Negative for cough and shortness of breath.   Cardiovascular: Negative.  Negative for chest pain and leg swelling.  Gastrointestinal: Negative.  Negative for abdominal pain, constipation, diarrhea, nausea and vomiting.  Genitourinary: Negative.   Musculoskeletal: Negative.   Neurological: Negative.  Negative for weakness and headaches.  Endo/Heme/Allergies: Does not bruise/bleed easily.  Psychiatric/Behavioral: Negative.  The patient is not nervous/anxious and does not have insomnia.     As per HPI. Otherwise, a complete review of systems is negative.  PAST MEDICAL HISTORY: Past Medical History:  Diagnosis Date  . Abnormality of gait 03/19/2016  . Hyperthyroidism   . Polio   . PSVT (paroxysmal supraventricular tachycardia) ([place])     PAST SURGICAL HISTORY: Past Surgical History:  Procedure Laterality Date  . BREAST BIOPSY Right 1999   EXCISIONAL - NEG  . BREAST BIOPSY  Right 2012   EXCISIONAL - NEG  . BREAST SURGERY     breast biopsies  . COLONOSCOPY WITH PROPOFOL N/A 07/04/2015   Procedure: COLONOSCOPY WITH PROPOFOL;  Surgeon: Manya Silvas, MD;  Location: [facility] ENDOSCOPY;  Service: Endoscopy;  Laterality: N/A;  . ELECTROPHYSIOLOGIC STUDY N/A 12/16/2015   Procedure: SVT Ablation;  Surgeon: Evans Lance, MD;  Location: [place] [facility];  Service: Cardiovascular;  Laterality: N/A;  . TONSILLECTOMY      FAMILY HISTORY: Reviewed and unchanged. No reported history of malignancy or chronic disease.     ADVANCED DIRECTIVES:    HEALTH MAINTENANCE: Social History   Tobacco Use  . Smoking status: Never Smoker  . Smokeless tobacco: Never Used  Substance Use Topics  . Alcohol use: No  . Drug use: Not on file     Colonoscopy:  PAP:  Bone density:  Lipid panel:  Allergies  Allergen Reactions  . Bee Venom Anaphylaxis  . Caffeine     Fever, chills, withdrawal symptoms    Current Outpatient Medications  Medication Sig Dispense Refill  . ARMOUR THYROID 60 MG tablet Take 60 mg by mouth daily.  4  . calcium carbonate (OSCAL) 1500 (600 CA) MG TABS tablet Take 1,200 mg by mouth 2 (two) times a week.     . cholecalciferol (VITAMIN D) 1000 units tablet Take 1,000 Units by mouth daily.    . Cyanocobalamin (RA VITAMIN B-12 TR) 1000 MCG TBCR Take 1,000 mcg by mouth daily.     Marland Kitchen DHEA 25 MG CAPS Take 1 capsule by mouth 2 (two) times a week.    . Fish Oil-Cholecalciferol (FISH OIL + D3) 1000-1000 MG-UNIT CAPS Take 1,000 mg by mouth  daily.     . folic acid (FOLVITE) 517 MCG tablet Take 800 mcg by mouth daily.     . hydroxyurea (HYDREA) 500 MG capsule TAKE 1 CAPSULE (500 MG TOTAL) BY MOUTH DAILY. MAY TAKE WITH FOOD TO MINIMIZE GI SIDE EFFECTS. 90 capsule 1  . L-Theanine 100 MG CAPS Take 100 mg by mouth at bedtime.    . Magnesium 200 MG TABS Take by mouth.    Marland Kitchen MAGNESIUM CITRATE PO Take 200 mg by mouth daily.    . Melatonin 3 MG TABS Take by mouth.    .  Probiotic Product (PROBIOTIC DAILY PO) Take 1 tablet by mouth daily.    . Riboflavin 400 MG CAPS Take by mouth.    Marland Kitchen tiZANidine (ZANAFLEX) 4 MG tablet Take by mouth once as needed.   11   No current facility-administered medications for this visit.     OBJECTIVE: Vitals:   04/25/18 1050  BP: 115/78  Pulse: 83  Resp: 18  Temp: (!) 97.2 F (36.2 C)     Body mass index is 22.13 kg/m.    ECOG FS:0 - Asymptomatic  General: Well-developed, well-nourished, no acute distress. Eyes: Pink conjunctiva, anicteric sclera. Lungs: Clear to auscultation bilaterally. Heart: Regular rate and rhythm. No rubs, murmurs, or gallops. Abdomen: Soft, nontender, nondistended. No organomegaly noted, normoactive bowel sounds. Musculoskeletal: No edema, cyanosis, or clubbing. Neuro: Alert, answering all questions appropriately. Cranial nerves grossly intact. Skin: No rashes or petechiae noted. Psych: Normal affect.  LAB RESULTS:  Lab Results  Component Value Date   NA 138 05/19/2016   K 4.0 05/19/2016   CL 108 05/19/2016   CO2 23 05/19/2016   GLUCOSE 102 (H) 05/19/2016   BUN 14 05/19/2016   CREATININE 0.58 05/19/2016   CALCIUM 8.7 (L) 05/19/2016   PROT 6.8 05/19/2016   ALBUMIN 4.2 05/19/2016   AST 26 05/19/2016   ALT 20 05/19/2016   ALKPHOS 107 05/19/2016   BILITOT 1.2 05/19/2016   GFRNONAA >60 05/19/2016   GFRAA >60 05/19/2016    Lab Results  Component Value Date   WBC 10.4 04/25/2018   NEUTROABS 8.3 (H) 04/25/2018   HGB 13.8 04/25/2018   HCT 40.8 04/25/2018   MCV 99.8 04/25/2018   PLT 241 04/25/2018   Lab Results  Component Value Date   IRON 65 04/25/2018   TIBC 303 04/25/2018   IRONPCTSAT 22 04/25/2018    Lab Results  Component Value Date   FERRITIN 114 04/25/2018     STUDIES: No results found.  ASSESSMENT: JAK-2 positive, polycythemia vera, iron deficiency, heterozygous for hemochromatosis gene mutation H63D   PLAN:    1. Polycythemia vera: JAK-2 mutation is  positive. She does not require phlebotomy, but would consider in the future if necessary.  Patient's hemoglobin continues to be stable and within normal limits at 13.8.  Continue 500 mg Hydrea daily.  Return to clinic in 6 months with repeat laboratory work and further evaluation.   2. Leukocytosis: Resolved.  BCR/ABL was negative for underlying CML. Her peripheral blood flow cytometry did reveal approximately 1% myeloblasts.  Monitor.  Patient may need a bone marrow biopsy in the future for further evaluation, this is not necessary at this point. 3. Hemochromatosis: Patient is heterozygote, therefore this is likely clinically insignificant.  Ferritin is 114 today.  No phlebotomy necessary.   4. Elevated MCV: Chronic.  Secondary to Hydrea.    Approximately 20 minutes was spent in discussion of which greater than 50% was consultation.  Patient expressed understanding and was in agreement with this plan. She also understands that She can call clinic at any time with any questions, concerns, or complaints.    Lloyd Huger, MD 04/30/18 7:42 AM

## 2018-04-25 ENCOUNTER — Inpatient Hospital Stay: Payer: Medicare Other | Attending: Oncology

## 2018-04-25 ENCOUNTER — Inpatient Hospital Stay (HOSPITAL_BASED_OUTPATIENT_CLINIC_OR_DEPARTMENT_OTHER): Payer: Medicare Other | Admitting: Oncology

## 2018-04-25 ENCOUNTER — Encounter: Payer: Self-pay | Admitting: Oncology

## 2018-04-25 ENCOUNTER — Other Ambulatory Visit: Payer: Self-pay

## 2018-04-25 VITALS — BP 115/78 | HR 83 | Temp 97.2°F | Resp 18 | Wt 141.3 lb

## 2018-04-25 DIAGNOSIS — I471 Supraventricular tachycardia: Secondary | ICD-10-CM

## 2018-04-25 DIAGNOSIS — E0590 Thyrotoxicosis, unspecified without thyrotoxic crisis or storm: Secondary | ICD-10-CM | POA: Diagnosis not present

## 2018-04-25 DIAGNOSIS — D509 Iron deficiency anemia, unspecified: Secondary | ICD-10-CM | POA: Diagnosis not present

## 2018-04-25 DIAGNOSIS — Z79899 Other long term (current) drug therapy: Secondary | ICD-10-CM | POA: Diagnosis not present

## 2018-04-25 DIAGNOSIS — D45 Polycythemia vera: Secondary | ICD-10-CM

## 2018-04-25 LAB — CBC WITH DIFFERENTIAL/PLATELET
BASOS PCT: 1 %
Basophils Absolute: 0.1 10*3/uL (ref 0–0.1)
Eosinophils Absolute: 0.3 10*3/uL (ref 0–0.7)
Eosinophils Relative: 3 %
HEMATOCRIT: 40.8 % (ref 35.0–47.0)
HEMOGLOBIN: 13.8 g/dL (ref 12.0–16.0)
LYMPHS ABS: 1.4 10*3/uL (ref 1.0–3.6)
LYMPHS PCT: 14 %
MCH: 33.7 pg (ref 26.0–34.0)
MCHC: 33.8 g/dL (ref 32.0–36.0)
MCV: 99.8 fL (ref 80.0–100.0)
MONO ABS: 0.4 10*3/uL (ref 0.2–0.9)
MONOS PCT: 4 %
NEUTROS ABS: 8.3 10*3/uL — AB (ref 1.4–6.5)
NEUTROS PCT: 78 %
Platelets: 241 10*3/uL (ref 150–440)
RBC: 4.09 MIL/uL (ref 3.80–5.20)
RDW: 19.5 % — ABNORMAL HIGH (ref 11.5–14.5)
WBC: 10.4 10*3/uL (ref 3.6–11.0)

## 2018-04-25 LAB — IRON AND TIBC
Iron: 65 ug/dL (ref 28–170)
Saturation Ratios: 22 % (ref 10.4–31.8)
TIBC: 303 ug/dL (ref 250–450)
UIBC: 238 ug/dL

## 2018-04-25 LAB — FERRITIN: FERRITIN: 114 ng/mL (ref 11–307)

## 2018-04-25 NOTE — Progress Notes (Signed)
Pt in for follow up, denies any difficulties or concerns.  

## 2018-10-06 ENCOUNTER — Other Ambulatory Visit: Payer: Self-pay | Admitting: Oncology

## 2018-10-22 NOTE — Progress Notes (Signed)
[facility]  [phone]) [phone] Fax:(336) [phone]  ID: Priscilla Berry OB: [birth year]  MR#: [id number]  VZD#:[id number]  Patient Care Team: Idelle Crouch, MD as PCP - General (Internal Medicine)  CHIEF COMPLAINT: JAK-2 positive, polycythemia vera, iron deficiency, heterozygous for hemochromatosis gene mutation H63D   INTERVAL HISTORY: Patient returns to clinic today for repeat laboratory work and routine six-month evaluation.  She continues to feel well and remains asymptomatic.  She is tolerating Hydrea without significant side effects. She has no neurologic complaints. She denies any fevers, night sweats, or weight loss. She has no chest pain or shortness of breath. She denies any nausea, vomiting, constipation, or diarrhea. She has no urinary complaints.  Patient feels at her baseline offers no specific complaints today.  REVIEW OF SYSTEMS:   Review of Systems  Constitutional: Negative for fever, malaise/fatigue and weight loss.  HENT: Negative for congestion and sinus pain.   Respiratory: Negative.  Negative for cough and shortness of breath.   Cardiovascular: Negative.  Negative for chest pain and leg swelling.  Gastrointestinal: Negative.  Negative for abdominal pain, constipation, diarrhea, nausea and vomiting.  Genitourinary: Negative.   Musculoskeletal: Negative.   Neurological: Negative.  Negative for weakness and headaches.  Endo/Heme/Allergies: Does not bruise/bleed easily.  Psychiatric/Behavioral: Negative.  The patient is not nervous/anxious and does not have insomnia.     As per HPI. Otherwise, a complete review of systems is negative.  PAST MEDICAL HISTORY: Past Medical History:  Diagnosis Date  . Abnormality of gait 03/19/2016  . Hyperthyroidism   . Polio   . PSVT (paroxysmal supraventricular tachycardia) ([place])     PAST SURGICAL HISTORY: Past Surgical History:  Procedure Laterality Date  . BREAST BIOPSY Right 1999   EXCISIONAL  - NEG  . BREAST BIOPSY Right 2012   EXCISIONAL - NEG  . BREAST SURGERY     breast biopsies  . COLONOSCOPY WITH PROPOFOL N/A 07/04/2015   Procedure: COLONOSCOPY WITH PROPOFOL;  Surgeon: Manya Silvas, MD;  Location: [facility] ENDOSCOPY;  Service: Endoscopy;  Laterality: N/A;  . ELECTROPHYSIOLOGIC STUDY N/A 12/16/2015   Procedure: SVT Ablation;  Surgeon: Evans Lance, MD;  Location: [place] CV LAB;  Service: Cardiovascular;  Laterality: N/A;  . TONSILLECTOMY      FAMILY HISTORY: Reviewed and unchanged. No reported history of malignancy or chronic disease.     ADVANCED DIRECTIVES:    HEALTH MAINTENANCE: Social History   Tobacco Use  . Smoking status: Never Smoker  . Smokeless tobacco: Never Used  Substance Use Topics  . Alcohol use: No  . Drug use: Not on file     Colonoscopy:  PAP:  Bone density:  Lipid panel:  Allergies  Allergen Reactions  . Bee Venom Anaphylaxis  . Caffeine     Fever, chills, withdrawal symptoms    Current Outpatient Medications  Medication Sig Dispense Refill  . ARMOUR THYROID 60 MG tablet Take 60 mg by mouth daily.  4  . calcium carbonate (OSCAL) 1500 (600 CA) MG TABS tablet Take 1,200 mg by mouth 2 (two) times a week.     . cholecalciferol (VITAMIN D) 1000 units tablet Take 1,000 Units by mouth daily.    . Cyanocobalamin (RA VITAMIN B-12 TR) 1000 MCG TBCR Take 1,000 mcg by mouth daily.     Marland Kitchen DHEA 25 MG CAPS Take 1 capsule by mouth 2 (two) times a week.    . Fish Oil-Cholecalciferol (FISH OIL + D3) 1000-1000 MG-UNIT CAPS Take 1,000 mg  by mouth daily.     . folic acid (FOLVITE) 330 MCG tablet Take 800 mcg by mouth daily.     . hydroxyurea (HYDREA) 500 MG capsule TAKE 1 CAPSULE (500 MG TOTAL) BY MOUTH DAILY. MAY TAKE WITH FOOD TO MINIMIZE GI SIDE EFFECTS. 90 capsule 1  . L-Theanine 100 MG CAPS Take 100 mg by mouth at bedtime.    Marland Kitchen MAGNESIUM CITRATE PO Take 200 mg by mouth daily.    . Melatonin 3 MG TABS Take by mouth.    . Probiotic Product  (PROBIOTIC DAILY PO) Take 1 tablet by mouth daily.    . Riboflavin 400 MG CAPS Take by mouth.    Marland Kitchen tiZANidine (ZANAFLEX) 4 MG tablet Take by mouth once as needed.   11  . Magnesium 200 MG TABS Take by mouth.     No current facility-administered medications for this visit.     OBJECTIVE: Vitals:   10/26/18 1116  BP: 111/74  Pulse: 80  Resp: 18  Temp: 98.1 F (36.7 C)     Body mass index is 21.63 kg/m.    ECOG FS:0 - Asymptomatic  General: Well-developed, well-nourished, no acute distress. Eyes: Pink conjunctiva, anicteric sclera. HEENT: Normocephalic, moist mucous membranes. Lungs: Clear to auscultation bilaterally. Heart: Regular rate and rhythm. No rubs, murmurs, or gallops. Abdomen: Soft, nontender, nondistended. No organomegaly noted, normoactive bowel sounds. Musculoskeletal: No edema, cyanosis, or clubbing. Neuro: Alert, answering all questions appropriately. Cranial nerves grossly intact. Skin: No rashes or petechiae noted. Psych: Normal affect.  LAB RESULTS:  Lab Results  Component Value Date   NA 138 05/19/2016   K 4.0 05/19/2016   CL 108 05/19/2016   CO2 23 05/19/2016   GLUCOSE 102 (H) 05/19/2016   BUN 14 05/19/2016   CREATININE 0.58 05/19/2016   CALCIUM 8.7 (L) 05/19/2016   PROT 6.8 05/19/2016   ALBUMIN 4.2 05/19/2016   AST 26 05/19/2016   ALT 20 05/19/2016   ALKPHOS 107 05/19/2016   BILITOT 1.2 05/19/2016   GFRNONAA >60 05/19/2016   GFRAA >60 05/19/2016    Lab Results  Component Value Date   WBC 14.5 (H) 10/26/2018   NEUTROABS 11.5 (H) 10/26/2018   HGB 13.5 10/26/2018   HCT 44.1 10/26/2018   MCV 98.2 10/26/2018   PLT 287 10/26/2018   Lab Results  Component Value Date   IRON 49 10/26/2018   TIBC 323 10/26/2018   IRONPCTSAT 15 10/26/2018    Lab Results  Component Value Date   FERRITIN 83 10/26/2018     STUDIES: No results found.  ASSESSMENT: JAK-2 positive, polycythemia vera, iron deficiency, heterozygous for hemochromatosis gene  mutation H63D   PLAN:    1. Polycythemia vera: JAK-2 mutation is positive. She does not require phlebotomy, but would consider in the future if necessary.  Patient's hemoglobin continues to be stable at 13.5.  Continue 500 mg Hydrea daily.  Return to clinic in 6 months for routine evaluation. 2. Leukocytosis: Mild.  Previously, BCR/ABL was negative for underlying CML. Her peripheral blood flow cytometry did reveal approximately 1% myeloblasts.  Repeat peripheral blood flow cytometry at next clinic visit.  Consider bone marrow biopsy in the future, but this is not necessary at this point. 3. Hemochromatosis: Patient is heterozygote, therefore this is likely clinically insignificant.  Ferritin is 83 today.  No phlebotomy necessary.   4. Elevated MCV: Chronic and relatively unchanged.  Secondary to Hydrea.   Patient expressed understanding and was in agreement with this plan. She also  understands that She can call clinic at any time with any questions, concerns, or complaints.    Lloyd Huger, MD 10/27/18 11:20 AM

## 2018-10-26 ENCOUNTER — Encounter: Payer: Self-pay | Admitting: Oncology

## 2018-10-26 ENCOUNTER — Inpatient Hospital Stay: Payer: Medicare Other | Attending: Oncology

## 2018-10-26 ENCOUNTER — Inpatient Hospital Stay (HOSPITAL_BASED_OUTPATIENT_CLINIC_OR_DEPARTMENT_OTHER): Payer: Medicare Other | Admitting: Oncology

## 2018-10-26 ENCOUNTER — Other Ambulatory Visit: Payer: Self-pay

## 2018-10-26 VITALS — BP 111/74 | HR 80 | Temp 98.1°F | Resp 18 | Wt 138.1 lb

## 2018-10-26 DIAGNOSIS — D72829 Elevated white blood cell count, unspecified: Secondary | ICD-10-CM | POA: Insufficient documentation

## 2018-10-26 DIAGNOSIS — D45 Polycythemia vera: Secondary | ICD-10-CM | POA: Diagnosis not present

## 2018-10-26 DIAGNOSIS — Z79899 Other long term (current) drug therapy: Secondary | ICD-10-CM | POA: Insufficient documentation

## 2018-10-26 LAB — IRON AND TIBC
Iron: 49 ug/dL (ref 28–170)
SATURATION RATIOS: 15 % (ref 10.4–31.8)
TIBC: 323 ug/dL (ref 250–450)
UIBC: 274 ug/dL

## 2018-10-26 LAB — CBC WITH DIFFERENTIAL/PLATELET
Abs Immature Granulocytes: 0.25 10*3/uL — ABNORMAL HIGH (ref 0.00–0.07)
Basophils Absolute: 0.1 10*3/uL (ref 0.0–0.1)
Basophils Relative: 1 %
EOS ABS: 0.3 10*3/uL (ref 0.0–0.5)
EOS PCT: 2 %
HEMATOCRIT: 44.1 % (ref 36.0–46.0)
Hemoglobin: 13.5 g/dL (ref 12.0–15.0)
Immature Granulocytes: 2 %
LYMPHS ABS: 1.7 10*3/uL (ref 0.7–4.0)
Lymphocytes Relative: 12 %
MCH: 30.1 pg (ref 26.0–34.0)
MCHC: 30.6 g/dL (ref 30.0–36.0)
MCV: 98.2 fL (ref 80.0–100.0)
MONO ABS: 0.5 10*3/uL (ref 0.1–1.0)
Monocytes Relative: 4 %
NRBC: 0 % (ref 0.0–0.2)
Neutro Abs: 11.5 10*3/uL — ABNORMAL HIGH (ref 1.7–7.7)
Neutrophils Relative %: 79 %
Platelets: 287 10*3/uL (ref 150–400)
RBC: 4.49 MIL/uL (ref 3.87–5.11)
RDW: 19.6 % — AB (ref 11.5–15.5)
WBC: 14.5 10*3/uL — AB (ref 4.0–10.5)

## 2018-10-26 LAB — FERRITIN: Ferritin: 83 ng/mL (ref 11–307)

## 2018-10-26 NOTE — Progress Notes (Signed)
Patient here for follow up

## 2019-04-03 ENCOUNTER — Other Ambulatory Visit: Payer: Self-pay | Admitting: Oncology

## 2019-04-23 ENCOUNTER — Other Ambulatory Visit: Payer: Self-pay

## 2019-04-23 DIAGNOSIS — D45 Polycythemia vera: Secondary | ICD-10-CM

## 2019-04-24 ENCOUNTER — Other Ambulatory Visit: Payer: Self-pay

## 2019-04-24 ENCOUNTER — Inpatient Hospital Stay: Payer: Medicare Other | Attending: Oncology | Admitting: *Deleted

## 2019-04-24 DIAGNOSIS — D45 Polycythemia vera: Secondary | ICD-10-CM | POA: Diagnosis present

## 2019-04-24 LAB — CBC WITH DIFFERENTIAL/PLATELET
Abs Immature Granulocytes: 0.33 10*3/uL — ABNORMAL HIGH (ref 0.00–0.07)
Basophils Absolute: 0.2 10*3/uL — ABNORMAL HIGH (ref 0.0–0.1)
Basophils Relative: 1 %
Eosinophils Absolute: 0.4 10*3/uL (ref 0.0–0.5)
Eosinophils Relative: 2 %
HCT: 52.7 % — ABNORMAL HIGH (ref 36.0–46.0)
Hemoglobin: 16.2 g/dL — ABNORMAL HIGH (ref 12.0–15.0)
Immature Granulocytes: 2 %
Lymphocytes Relative: 10 %
Lymphs Abs: 2 10*3/uL (ref 0.7–4.0)
MCH: 28.1 pg (ref 26.0–34.0)
MCHC: 30.7 g/dL (ref 30.0–36.0)
MCV: 91.3 fL (ref 80.0–100.0)
Monocytes Absolute: 0.7 10*3/uL (ref 0.1–1.0)
Monocytes Relative: 4 %
Neutro Abs: 16.1 10*3/uL — ABNORMAL HIGH (ref 1.7–7.7)
Neutrophils Relative %: 81 %
Platelets: 244 10*3/uL (ref 150–400)
RBC: 5.77 MIL/uL — ABNORMAL HIGH (ref 3.87–5.11)
RDW: 20 % — ABNORMAL HIGH (ref 11.5–15.5)
WBC: 19.8 10*3/uL — ABNORMAL HIGH (ref 4.0–10.5)
nRBC: 0.2 % (ref 0.0–0.2)

## 2019-04-24 LAB — IRON AND TIBC
Iron: 29 ug/dL (ref 28–170)
Saturation Ratios: 7 % — ABNORMAL LOW (ref 10.4–31.8)
TIBC: 434 ug/dL (ref 250–450)
UIBC: 405 ug/dL

## 2019-04-24 LAB — FERRITIN: Ferritin: 13 ng/mL (ref 11–307)

## 2019-04-25 ENCOUNTER — Inpatient Hospital Stay (HOSPITAL_BASED_OUTPATIENT_CLINIC_OR_DEPARTMENT_OTHER): Payer: Medicare Other | Admitting: Oncology

## 2019-04-25 ENCOUNTER — Other Ambulatory Visit: Payer: Self-pay

## 2019-04-25 ENCOUNTER — Other Ambulatory Visit: Payer: Medicare Other

## 2019-04-25 DIAGNOSIS — D45 Polycythemia vera: Secondary | ICD-10-CM

## 2019-04-25 NOTE — Progress Notes (Signed)
[facility]  Telephone:(336) [phone] Fax:(336) [phone]  ID: Priscilla Berry OB: 1946/11/28  MR#: [id number]  VOH#:[id number]  Patient Care Team: Idelle Crouch, MD as PCP - General (Internal Medicine)  I connected with Priscilla Berry on 04/26/19 at 10:45 AM EDT by video enabled telemedicine visit and verified that I am speaking with the correct person using two identifiers.   I discussed the limitations, risks, security and privacy concerns of performing an evaluation and management service by telemedicine and the availability of in-person appointments. I also discussed with the patient that there may be a patient responsible charge related to this service. The patient expressed understanding and agreed to proceed.   Other persons participating in the visit and their role in the encounter: Patient, MD  Patient's location: Home Provider's location: Clinic  CHIEF COMPLAINT: JAK-2 positive, polycythemia vera, iron deficiency, heterozygous for hemochromatosis gene mutation H63D   INTERVAL HISTORY: Patient agreed to video enabled telemedicine visit to discuss her laboratory work and routine evaluation.  She continues to tolerate Hydrea without significant side effects.  She currently feels well and is asymptomatic.  She has no neurologic complaints. She denies any fevers, night sweats, or weight loss.  She denies any chest pain, shortness of breath, cough, or hemoptysis.  She denies any nausea, vomiting, constipation, or diarrhea. She has no urinary complaints.  Patient offers no specific complaints today.  REVIEW OF SYSTEMS:   Review of Systems  Constitutional: Negative for fever, malaise/fatigue and weight loss.  HENT: Negative for congestion and sinus pain.   Respiratory: Negative.  Negative for cough and shortness of breath.   Cardiovascular: Negative.  Negative for chest pain and leg swelling.  Gastrointestinal: Negative.  Negative for abdominal pain,  constipation, diarrhea, nausea and vomiting.  Genitourinary: Negative.   Musculoskeletal: Negative.   Neurological: Negative.  Negative for weakness and headaches.  Endo/Heme/Allergies: Does not bruise/bleed easily.  Psychiatric/Behavioral: Negative.  The patient is not nervous/anxious and does not have insomnia.     As per HPI. Otherwise, a complete review of systems is negative.  PAST MEDICAL HISTORY: Past Medical History:  Diagnosis Date  . Abnormality of gait 03/19/2016  . Hyperthyroidism   . Polio   . PSVT (paroxysmal supraventricular tachycardia) ([place])     PAST SURGICAL HISTORY: Past Surgical History:  Procedure Laterality Date  . BREAST BIOPSY Right 1999   EXCISIONAL - NEG  . BREAST BIOPSY Right 2012   EXCISIONAL - NEG  . BREAST SURGERY     breast biopsies  . COLONOSCOPY WITH PROPOFOL N/A 07/04/2015   Procedure: COLONOSCOPY WITH PROPOFOL;  Surgeon: Manya Silvas, MD;  Location: [facility] ENDOSCOPY;  Service: Endoscopy;  Laterality: N/A;  . ELECTROPHYSIOLOGIC STUDY N/A 12/16/2015   Procedure: SVT Ablation;  Surgeon: Evans Lance, MD;  Location: [facility] CV LAB;  Service: Cardiovascular;  Laterality: N/A;  . TONSILLECTOMY      FAMILY HISTORY: Reviewed and unchanged. No reported history of malignancy or chronic disease.     ADVANCED DIRECTIVES:    HEALTH MAINTENANCE: Social History   Tobacco Use  . Smoking status: Never Smoker  . Smokeless tobacco: Never Used  Substance Use Topics  . Alcohol use: No  . Drug use: Not on file     Colonoscopy:  PAP:  Bone density:  Lipid panel:  Allergies  Allergen Reactions  . Bee Venom Anaphylaxis  . Caffeine     Fever, chills, withdrawal symptoms    Current Outpatient Medications  Medication  Sig Dispense Refill  . ARMOUR THYROID 60 MG tablet Take 60 mg by mouth daily.  4  . calcium carbonate (OSCAL) 1500 (600 CA) MG TABS tablet Take 1,200 mg by mouth 2 (two) times a week.     . cholecalciferol (VITAMIN D) 1000  units tablet Take 1,000 Units by mouth daily.    . Cyanocobalamin (RA VITAMIN B-12 TR) 1000 MCG TBCR Take 1,000 mcg by mouth daily.     Marland Kitchen DHEA 25 MG CAPS Take 1 capsule by mouth 2 (two) times a week.    . Fish Oil-Cholecalciferol (FISH OIL + D3) 1000-1000 MG-UNIT CAPS Take 1,000 mg by mouth daily.     . folic acid (FOLVITE) 128 MCG tablet Take 800 mcg by mouth daily.     . hydroxyurea (HYDREA) 500 MG capsule TAKE 1 CAPSULE (500 MG TOTAL) BY MOUTH DAILY. MAY TAKE WITH FOOD TO MINIMIZE GI SIDE EFFECTS. 90 capsule 1  . L-Theanine 100 MG CAPS Take 100 mg by mouth at bedtime.    . Magnesium 200 MG TABS Take by mouth.    . Probiotic Product (PROBIOTIC DAILY PO) Take 1 tablet by mouth daily.    . Riboflavin 400 MG CAPS Take by mouth.    Marland Kitchen tiZANidine (ZANAFLEX) 4 MG tablet Take by mouth once as needed.   11  . Melatonin 3 MG TABS Take by mouth.     No current facility-administered medications for this visit.     OBJECTIVE: There were no vitals filed for this visit.   There is no height or weight on file to calculate BMI.    ECOG FS:0 - Asymptomatic  General: Well-developed, well-nourished, no acute distress. HEENT: Normocephalic. Neuro: Alert, answering all questions appropriately. Cranial nerves grossly intact. Skin: No rashes or petechiae noted. Psych: Normal affect.  LAB RESULTS:  Lab Results  Component Value Date   NA 138 05/19/2016   K 4.0 05/19/2016   CL 108 05/19/2016   CO2 23 05/19/2016   GLUCOSE 102 (H) 05/19/2016   BUN 14 05/19/2016   CREATININE 0.58 05/19/2016   CALCIUM 8.7 (L) 05/19/2016   PROT 6.8 05/19/2016   ALBUMIN 4.2 05/19/2016   AST 26 05/19/2016   ALT 20 05/19/2016   ALKPHOS 107 05/19/2016   BILITOT 1.2 05/19/2016   GFRNONAA >60 05/19/2016   GFRAA >60 05/19/2016    Lab Results  Component Value Date   WBC 19.8 (H) 04/24/2019   NEUTROABS 16.1 (H) 04/24/2019   HGB 16.2 (H) 04/24/2019   HCT 52.7 (H) 04/24/2019   MCV 91.3 04/24/2019   PLT 244 04/24/2019    Lab Results  Component Value Date   IRON 29 04/24/2019   TIBC 434 04/24/2019   IRONPCTSAT 7 (L) 04/24/2019    Lab Results  Component Value Date   FERRITIN 13 04/24/2019     STUDIES: No results found.  ASSESSMENT: JAK-2 positive, polycythemia vera, iron deficiency, heterozygous for hemochromatosis gene mutation H63D   PLAN:    1. Polycythemia vera: JAK-2 mutation is positive. She does not require phlebotomy, but would consider in the future if necessary.  Patient's hemoglobin has trended up and is now 16.2.  We will continue 500 mg Hydrea daily, but will consider increasing the dose if hemoglobin remains elevated or trends up.  Return to clinic in 6 weeks with repeat laboratory work and further evaluation.   2. Leukocytosis: Patient's white blood cell count was trending up and is now 19.8. Previously, BCR/ABL was negative for underlying CML.  Her peripheral blood flow cytometry did reveal approximately 1% myeloblasts.  Repeat peripheral blood flow cytometry was drawn today and is pending at time of dictation.  Consider bone marrow biopsy in the future, but this is not necessary at this point. 3. Hemochromatosis: Patient is heterozygote, therefore this is likely clinically insignificant.  Ferritin is 13.  No phlebotomy necessary.   4. Elevated MCV: MCV is within normal limits.  I provided 25 minutes of face-to-face video visit time during this encounter, and > 50% was spent counseling as documented under my assessment & plan.    Patient expressed understanding and was in agreement with this plan. She also understands that She can call clinic at any time with any questions, concerns, or complaints.    Lloyd Huger, MD 04/26/19 6:56 AM

## 2019-04-25 NOTE — Progress Notes (Signed)
Patient stated that she had been doing well with no complaints. Patient stated that she does her monthly self-breast exams and does not have any changes.

## 2019-04-26 ENCOUNTER — Other Ambulatory Visit: Payer: Medicare Other

## 2019-04-26 ENCOUNTER — Ambulatory Visit: Payer: Medicare Other | Admitting: Oncology

## 2019-04-26 LAB — COMP PANEL: LEUKEMIA/LYMPHOMA

## 2019-06-09 NOTE — Progress Notes (Signed)
[place]  Telephone:(336) [phone] Fax:(336) [phone]  ID: Jeryl Columbia OB: 1946/11/29  MR#: [id number]  VEH#:[id number]  Patient Care Team: Idelle Crouch, MD as PCP - General (Internal Medicine)   CHIEF COMPLAINT: JAK-2 positive, polycythemia vera, iron deficiency, heterozygous for hemochromatosis gene mutation H63D   INTERVAL HISTORY: Patient returns to clinic today for repeat laboratory work and further evaluation.  She continues to feel well and remains asymptomatic.  She is tolerating Hydrea well without significant side effects. She has no neurologic complaints. She denies any fevers, night sweats, or weight loss.  She denies any chest pain, shortness of breath, cough, or hemoptysis.  She denies any nausea, vomiting, constipation, or diarrhea. She has no urinary complaints.  Patient feels at her baseline offers no specific complaints today.  REVIEW OF SYSTEMS:   Review of Systems  Constitutional: Negative for fever, malaise/fatigue and weight loss.  HENT: Negative for congestion and sinus pain.   Respiratory: Negative.  Negative for cough and shortness of breath.   Cardiovascular: Negative.  Negative for chest pain and leg swelling.  Gastrointestinal: Negative.  Negative for abdominal pain, constipation, diarrhea, nausea and vomiting.  Genitourinary: Negative.   Musculoskeletal: Negative.   Neurological: Negative.  Negative for weakness and headaches.  Endo/Heme/Allergies: Does not bruise/bleed easily.  Psychiatric/Behavioral: Negative.  The patient is not nervous/anxious and does not have insomnia.     As per HPI. Otherwise, a complete review of systems is negative.  PAST MEDICAL HISTORY: Past Medical History:  Diagnosis Date  . Abnormality of gait 03/19/2016  . Hyperthyroidism   . Polio   . PSVT (paroxysmal supraventricular tachycardia) ([place])     PAST SURGICAL HISTORY: Past Surgical History:  Procedure Laterality Date  . BREAST BIOPSY  Right 1999   EXCISIONAL - NEG  . BREAST BIOPSY Right 2012   EXCISIONAL - NEG  . BREAST SURGERY     breast biopsies  . COLONOSCOPY WITH PROPOFOL N/A 07/04/2015   Procedure: COLONOSCOPY WITH PROPOFOL;  Surgeon: Manya Silvas, MD;  Location: [facility] ENDOSCOPY;  Service: Endoscopy;  Laterality: N/A;  . ELECTROPHYSIOLOGIC STUDY N/A 12/16/2015   Procedure: SVT Ablation;  Surgeon: Evans Lance, MD;  Location: [facility] CV LAB;  Service: Cardiovascular;  Laterality: N/A;  . TONSILLECTOMY      FAMILY HISTORY: Reviewed and unchanged. No reported history of malignancy or chronic disease.     ADVANCED DIRECTIVES:    HEALTH MAINTENANCE: Social History   Tobacco Use  . Smoking status: Never Smoker  . Smokeless tobacco: Never Used  Substance Use Topics  . Alcohol use: No  . Drug use: Not on file     Colonoscopy:  PAP:  Bone density:  Lipid panel:  Allergies  Allergen Reactions  . Bee Venom Anaphylaxis  . Caffeine     Fever, chills, withdrawal symptoms    Current Outpatient Medications  Medication Sig Dispense Refill  . ARMOUR THYROID 60 MG tablet Take 60 mg by mouth daily.  4  . calcium carbonate (OSCAL) 1500 (600 CA) MG TABS tablet Take 1,200 mg by mouth 2 (two) times a week.     . cholecalciferol (VITAMIN D) 1000 units tablet Take 1,000 Units by mouth daily.    . Cyanocobalamin (RA VITAMIN B-12 TR) 1000 MCG TBCR Take 1,000 mcg by mouth daily.     Marland Kitchen DHEA 25 MG CAPS Take 1 capsule by mouth 2 (two) times a week.    . Fish Oil-Cholecalciferol (FISH OIL + D3) 1000-1000  MG-UNIT CAPS Take 1,000 mg by mouth daily.     . folic acid (FOLVITE) 735 MCG tablet Take 800 mcg by mouth daily.     . hydroxyurea (HYDREA) 500 MG capsule TAKE 1 CAPSULE (500 MG TOTAL) BY MOUTH DAILY. MAY TAKE WITH FOOD TO MINIMIZE GI SIDE EFFECTS. 90 capsule 1  . L-Theanine 100 MG CAPS Take 100 mg by mouth at bedtime.    . Magnesium 200 MG TABS Take by mouth.    . Melatonin 3 MG TABS Take by mouth.    .  Probiotic Product (PROBIOTIC DAILY PO) Take 1 tablet by mouth daily.    . Riboflavin 400 MG CAPS Take by mouth.    Marland Kitchen tiZANidine (ZANAFLEX) 4 MG tablet Take by mouth once as needed.   11   No current facility-administered medications for this visit.     OBJECTIVE: Vitals:   06/15/19 1021  BP: 113/71  Pulse: 83  Temp: 98.3 F (36.8 C)     Body mass index is 21.79 kg/m.    ECOG FS:0 - Asymptomatic  General: Well-developed, well-nourished, no acute distress. Eyes: Pink conjunctiva, anicteric sclera. HEENT: Normocephalic, moist mucous membranes. Lungs: Clear to auscultation bilaterally. Heart: Regular rate and rhythm. No rubs, murmurs, or gallops. Abdomen: Soft, nontender, nondistended. No organomegaly noted, normoactive bowel sounds. Musculoskeletal: No edema, cyanosis, or clubbing. Neuro: Alert, answering all questions appropriately. Cranial nerves grossly intact. Skin: No rashes or petechiae noted. Psych: Normal affect.  LAB RESULTS:  Lab Results  Component Value Date   NA 140 06/15/2019   K 4.0 06/15/2019   CL 106 06/15/2019   CO2 25 06/15/2019   GLUCOSE 103 (H) 06/15/2019   BUN 14 06/15/2019   CREATININE 0.59 06/15/2019   CALCIUM 9.1 06/15/2019   PROT 6.7 06/15/2019   ALBUMIN 4.2 06/15/2019   AST 24 06/15/2019   ALT 19 06/15/2019   ALKPHOS 136 (H) 06/15/2019   BILITOT 1.0 06/15/2019   GFRNONAA >60 06/15/2019   GFRAA >60 06/15/2019    Lab Results  Component Value Date   WBC 19.4 (H) 06/15/2019   NEUTROABS 15.9 (H) 06/15/2019   HGB 16.2 (H) 06/15/2019   HCT 55.4 (H) 06/15/2019   MCV 86.3 06/15/2019   PLT 285 06/15/2019   Lab Results  Component Value Date   IRON 26 (L) 06/15/2019   TIBC 442 06/15/2019   IRONPCTSAT 6 (L) 06/15/2019    Lab Results  Component Value Date   FERRITIN 11 06/15/2019     STUDIES: No results found.  ASSESSMENT: JAK-2 positive, polycythemia vera, iron deficiency, heterozygous for hemochromatosis gene mutation H63D    PLAN:    1. Polycythemia vera: JAK-2 mutation is positive. She does not require phlebotomy, but would consider in the future if necessary.  Patient's hemoglobin remains elevated, but stable at 16.2.  Continue current dose of Hydrea at 500 mg daily, but will consider increasing dose if her hemoglobin continues to trend up.  No intervention is needed at this time.  Return to clinic in 3 months with laboratory work only and then in 6 months for laboratory work and further evaluation. 2. Leukocytosis: Chronic and unchanged.  Previously, BCR/ABL was negative for underlying CML.  Repeat peripheral blood flow cytometry did not reveal any abnormality. Consider bone marrow biopsy in the future, but this is not necessary at this point. 3. Hemochromatosis: Patient is heterozygote, therefore this is likely clinically insignificant.  Ferritin is 13.  No phlebotomy necessary.   4.  Iron deficiency: Monitor.  I spent a total of 30 minutes face-to-face with the patient of which greater than 50% of the visit was spent in counseling and coordination of care as detailed above.  Patient expressed understanding and was in agreement with this plan. She also understands that She can call clinic at any time with any questions, concerns, or complaints.    Lloyd Huger, MD 06/16/19 8:19 AM

## 2019-06-15 ENCOUNTER — Other Ambulatory Visit: Payer: Self-pay

## 2019-06-15 ENCOUNTER — Inpatient Hospital Stay (HOSPITAL_BASED_OUTPATIENT_CLINIC_OR_DEPARTMENT_OTHER): Payer: Medicare Other | Admitting: Oncology

## 2019-06-15 ENCOUNTER — Encounter: Payer: Self-pay | Admitting: Oncology

## 2019-06-15 ENCOUNTER — Inpatient Hospital Stay: Payer: Medicare Other | Attending: Oncology

## 2019-06-15 VITALS — BP 113/71 | HR 83 | Temp 98.3°F | Wt 139.1 lb

## 2019-06-15 DIAGNOSIS — R269 Unspecified abnormalities of gait and mobility: Secondary | ICD-10-CM | POA: Insufficient documentation

## 2019-06-15 DIAGNOSIS — Z79899 Other long term (current) drug therapy: Secondary | ICD-10-CM

## 2019-06-15 DIAGNOSIS — D72829 Elevated white blood cell count, unspecified: Secondary | ICD-10-CM | POA: Diagnosis not present

## 2019-06-15 DIAGNOSIS — I471 Supraventricular tachycardia: Secondary | ICD-10-CM | POA: Insufficient documentation

## 2019-06-15 DIAGNOSIS — D45 Polycythemia vera: Secondary | ICD-10-CM

## 2019-06-15 DIAGNOSIS — D509 Iron deficiency anemia, unspecified: Secondary | ICD-10-CM

## 2019-06-15 DIAGNOSIS — E0590 Thyrotoxicosis, unspecified without thyrotoxic crisis or storm: Secondary | ICD-10-CM

## 2019-06-15 LAB — CBC WITH DIFFERENTIAL/PLATELET
Abs Immature Granulocytes: 0.36 10*3/uL — ABNORMAL HIGH (ref 0.00–0.07)
Basophils Absolute: 0.2 10*3/uL — ABNORMAL HIGH (ref 0.0–0.1)
Basophils Relative: 1 %
Eosinophils Absolute: 0.4 10*3/uL (ref 0.0–0.5)
Eosinophils Relative: 2 %
HCT: 55.4 % — ABNORMAL HIGH (ref 36.0–46.0)
Hemoglobin: 16.2 g/dL — ABNORMAL HIGH (ref 12.0–15.0)
Immature Granulocytes: 2 %
Lymphocytes Relative: 9 %
Lymphs Abs: 1.8 10*3/uL (ref 0.7–4.0)
MCH: 25.2 pg — ABNORMAL LOW (ref 26.0–34.0)
MCHC: 29.2 g/dL — ABNORMAL LOW (ref 30.0–36.0)
MCV: 86.3 fL (ref 80.0–100.0)
Monocytes Absolute: 0.7 10*3/uL (ref 0.1–1.0)
Monocytes Relative: 4 %
Neutro Abs: 15.9 10*3/uL — ABNORMAL HIGH (ref 1.7–7.7)
Neutrophils Relative %: 82 %
Platelets: 285 10*3/uL (ref 150–400)
RBC: 6.42 MIL/uL — ABNORMAL HIGH (ref 3.87–5.11)
RDW: 20.8 % — ABNORMAL HIGH (ref 11.5–15.5)
Smear Review: ADEQUATE
WBC: 19.4 10*3/uL — ABNORMAL HIGH (ref 4.0–10.5)
nRBC: 0.2 % (ref 0.0–0.2)

## 2019-06-15 LAB — COMPREHENSIVE METABOLIC PANEL
ALT: 19 U/L (ref 0–44)
AST: 24 U/L (ref 15–41)
Albumin: 4.2 g/dL (ref 3.5–5.0)
Alkaline Phosphatase: 136 U/L — ABNORMAL HIGH (ref 38–126)
Anion gap: 9 (ref 5–15)
BUN: 14 mg/dL (ref 8–23)
CO2: 25 mmol/L (ref 22–32)
Calcium: 9.1 mg/dL (ref 8.9–10.3)
Chloride: 106 mmol/L (ref 98–111)
Creatinine, Ser: 0.59 mg/dL (ref 0.44–1.00)
GFR calc Af Amer: >60 mL/min (ref >60)
GFR calc non Af Amer: >60 mL/min (ref >60)
Glucose, Bld: 103 mg/dL — ABNORMAL HIGH (ref 70–99)
Potassium: 4 mmol/L (ref 3.5–5.1)
Sodium: 140 mmol/L (ref 135–145)
Total Bilirubin: 1 mg/dL (ref 0.3–1.2)
Total Protein: 6.7 g/dL (ref 6.5–8.1)

## 2019-06-15 LAB — IRON AND TIBC
Iron: 26 ug/dL — ABNORMAL LOW (ref 28–170)
Saturation Ratios: 6 % — ABNORMAL LOW (ref 10.4–31.8)
TIBC: 442 ug/dL (ref 250–450)
UIBC: 416 ug/dL

## 2019-06-15 LAB — FERRITIN: Ferritin: 11 ng/mL (ref 11–307)

## 2019-06-15 NOTE — Progress Notes (Signed)
Pt in for follow up reports" feels better than she did in May when last had lab work".  "Anxious to see what labs are today".

## 2019-06-19 LAB — COMP PANEL: LEUKEMIA/LYMPHOMA

## 2019-08-20 ENCOUNTER — Other Ambulatory Visit: Payer: Self-pay | Admitting: Oncology

## 2019-08-20 NOTE — Telephone Encounter (Signed)
CBC with Differential Order: [id number] Status:  Final result Visible to patient:  No (not released) Next appt:  09/13/2019 at 11:15 AM in Oncology ([facility]) Dx:  Polycythemia vera ([place])  Ref Range & Units 9mo ago 50mo ago 9mo ago  WBC 4.0 - 10.5 K/uL 19.4High   19.8High   14.5High    RBC 3.87 - 5.11 MIL/uL 6.42High   5.77High   4.49   Hemoglobin 12.0 - 15.0 g/dL 16.2High   16.2High   13.5   HCT 36.0 - 46.0 % 55.4High   52.7High   44.1   MCV 80.0 - 100.0 fL 86.3  91.3  98.2   MCH 26.0 - 34.0 pg 25.2Low   28.1  30.1   MCHC 30.0 - 36.0 g/dL 29.2Low   30.7  30.6   RDW 11.5 - 15.5 % 20.8High   20.0High   19.6High    Platelets 150 - 400 K/uL 285  244  287   nRBC 0.0 - 0.2 % 0.2  0.2  0.0   Neutrophils Relative % % 82  81  79   Neutro Abs 1.7 - 7.7 K/uL 15.9High   16.1High   11.5High    Lymphocytes Relative % 9  10  12    Lymphs Abs 0.7 - 4.0 K/uL 1.8  2.0  1.7   Monocytes Relative % 4  4  4    Monocytes Absolute 0.1 - 1.0 K/uL 0.7  0.7  0.5   Eosinophils Relative % 2  2  2    Eosinophils Absolute 0.0 - 0.5 K/uL 0.4  0.4  0.3   Basophils Relative % 1  1  1    Basophils Absolute 0.0 - 0.1 K/uL 0.2High   0.2High   0.1   WBC Morphology  MORPHOLOGY UNREMARKABLE     RBC Morphology  MORPHOLOGY UNREMARKABLE     Smear Review  PLATELETS APPEAR ADEQUATE     Comment: OCC GIANT PLATELETS NOTED ON SMEAR  Immature Granulocytes % 2  2  2    Abs Immature Granulocytes 0.00 - 0.07 K/uL 0.36High   0.33High  CM  0.25High  CM   Comment: Performed at [facility], [address] [place], [place] [address]  Resulting Agency  [facility] CLIN LAB [facility]      Specimen Collected: 06/15/19 09:55 Last Resulted: 06/15/19 10:53

## 2019-09-05 ENCOUNTER — Other Ambulatory Visit: Payer: Self-pay

## 2019-09-06 ENCOUNTER — Inpatient Hospital Stay: Payer: Medicare Other | Attending: Oncology

## 2019-09-06 ENCOUNTER — Other Ambulatory Visit: Payer: Self-pay

## 2019-09-06 DIAGNOSIS — D45 Polycythemia vera: Secondary | ICD-10-CM

## 2019-09-06 LAB — CBC WITH DIFFERENTIAL/PLATELET
Abs Immature Granulocytes: 0.25 10*3/uL — ABNORMAL HIGH (ref 0.00–0.07)
Basophils Absolute: 0.2 10*3/uL — ABNORMAL HIGH (ref 0.0–0.1)
Basophils Relative: 1 %
Eosinophils Absolute: 0.5 10*3/uL (ref 0.0–0.5)
Eosinophils Relative: 2 %
HCT: 56.2 % — ABNORMAL HIGH (ref 36.0–46.0)
Hemoglobin: 16.6 g/dL — ABNORMAL HIGH (ref 12.0–15.0)
Immature Granulocytes: 1 %
Lymphocytes Relative: 11 %
Lymphs Abs: 2 10*3/uL (ref 0.7–4.0)
MCH: 24.8 pg — ABNORMAL LOW (ref 26.0–34.0)
MCHC: 29.5 g/dL — ABNORMAL LOW (ref 30.0–36.0)
MCV: 83.9 fL (ref 80.0–100.0)
Monocytes Absolute: 0.6 10*3/uL (ref 0.1–1.0)
Monocytes Relative: 3 %
Neutro Abs: 15.4 10*3/uL — ABNORMAL HIGH (ref 1.7–7.7)
Neutrophils Relative %: 82 %
Platelets: 269 10*3/uL (ref 150–400)
RBC: 6.7 MIL/uL — ABNORMAL HIGH (ref 3.87–5.11)
RDW: 22.6 % — ABNORMAL HIGH (ref 11.5–15.5)
WBC: 18.9 10*3/uL — ABNORMAL HIGH (ref 4.0–10.5)
nRBC: 0.4 % — ABNORMAL HIGH (ref 0.0–0.2)

## 2019-09-06 LAB — FERRITIN: Ferritin: 18 ng/mL (ref 11–307)

## 2019-09-06 LAB — COMPREHENSIVE METABOLIC PANEL
ALT: 15 U/L (ref 0–44)
AST: 23 U/L (ref 15–41)
Albumin: 4 g/dL (ref 3.5–5.0)
Alkaline Phosphatase: 124 U/L (ref 38–126)
Anion gap: 9 (ref 5–15)
BUN: 20 mg/dL (ref 8–23)
CO2: 24 mmol/L (ref 22–32)
Calcium: 9 mg/dL (ref 8.9–10.3)
Chloride: 106 mmol/L (ref 98–111)
Creatinine, Ser: 0.57 mg/dL (ref 0.44–1.00)
GFR calc Af Amer: >60 mL/min (ref >60)
GFR calc non Af Amer: >60 mL/min (ref >60)
Glucose, Bld: 105 mg/dL — ABNORMAL HIGH (ref 70–99)
Potassium: 4.3 mmol/L (ref 3.5–5.1)
Sodium: 139 mmol/L (ref 135–145)
Total Bilirubin: 1.1 mg/dL (ref 0.3–1.2)
Total Protein: 6.8 g/dL (ref 6.5–8.1)

## 2019-09-06 LAB — IRON AND TIBC
Iron: 42 ug/dL (ref 28–170)
Saturation Ratios: 10 % — ABNORMAL LOW (ref 10.4–31.8)
TIBC: 433 ug/dL (ref 250–450)
UIBC: 391 ug/dL

## 2019-09-13 ENCOUNTER — Other Ambulatory Visit: Payer: Medicare Other

## 2019-12-11 ENCOUNTER — Other Ambulatory Visit: Payer: Self-pay | Admitting: Oncology

## 2019-12-19 NOTE — Progress Notes (Signed)
[facility]  Telephone:(336) [phone] Fax:(336) [phone]  ID: Jeryl Columbia OB: [birth year]  MR#: [id number]  AYO#:[id number]  Patient Care Team: Idelle Crouch, MD as PCP - General (Internal Medicine)   CHIEF COMPLAINT: JAK-2 positive, polycythemia vera, iron deficiency, heterozygous for hemochromatosis gene mutation H63D   INTERVAL HISTORY: Patient returns to clinic today for repeat laboratory work and further evaluation.  She continues to tolerate Hydrea without significant side effects.  She currently feels well and is asymptomatic.  She has no neurologic complaints. She denies any fevers, night sweats, or weight loss.  She denies any chest pain, shortness of breath, cough, or hemoptysis.  She denies any nausea, vomiting, constipation, or diarrhea. She has no urinary complaints.  Patient offers no specific complaints today.  REVIEW OF SYSTEMS:   Review of Systems  Constitutional: Negative for fever, malaise/fatigue and weight loss.  HENT: Negative for congestion and sinus pain.   Respiratory: Negative.  Negative for cough and shortness of breath.   Cardiovascular: Negative.  Negative for chest pain and leg swelling.  Gastrointestinal: Negative.  Negative for abdominal pain, constipation, diarrhea, nausea and vomiting.  Genitourinary: Negative.   Musculoskeletal: Negative.   Neurological: Negative.  Negative for weakness and headaches.  Endo/Heme/Allergies: Does not bruise/bleed easily.  Psychiatric/Behavioral: Negative.  The patient is not nervous/anxious and does not have insomnia.     As per HPI. Otherwise, a complete review of systems is negative.  PAST MEDICAL HISTORY: Past Medical History:  Diagnosis Date  . Abnormality of gait 03/19/2016  . Hyperthyroidism   . Polio   . PSVT (paroxysmal supraventricular tachycardia) ([place])     PAST SURGICAL HISTORY: Past Surgical History:  Procedure Laterality Date  . BREAST BIOPSY Right 1999   EXCISIONAL -  NEG  . BREAST BIOPSY Right 2012   EXCISIONAL - NEG  . BREAST SURGERY     breast biopsies  . COLONOSCOPY WITH PROPOFOL N/A 07/04/2015   Procedure: COLONOSCOPY WITH PROPOFOL;  Surgeon: Manya Silvas, MD;  Location: [facility] ENDOSCOPY;  Service: Endoscopy;  Laterality: N/A;  . ELECTROPHYSIOLOGIC STUDY N/A 12/16/2015   Procedure: SVT Ablation;  Surgeon: Evans Lance, MD;  Location: [place] [facility] LAB;  Service: Cardiovascular;  Laterality: N/A;  . TONSILLECTOMY      FAMILY HISTORY: Reviewed and unchanged. No reported history of malignancy or chronic disease.     ADVANCED DIRECTIVES:    HEALTH MAINTENANCE: Social History   Tobacco Use  . Smoking status: Never Smoker  . Smokeless tobacco: Never Used  Substance Use Topics  . Alcohol use: No  . Drug use: Not on file     Colonoscopy:  PAP:  Bone density:  Lipid panel:  Allergies  Allergen Reactions  . Bee Venom Anaphylaxis  . Caffeine     Fever, chills, withdrawal symptoms    Current Outpatient Medications  Medication Sig Dispense Refill  . ARMOUR THYROID 60 MG tablet Take 60 mg by mouth daily.  4  . calcium carbonate (OSCAL) 1500 (600 CA) MG TABS tablet Take 1,200 mg by mouth 2 (two) times a week.     . cholecalciferol (VITAMIN D) 1000 units tablet Take 1,000 Units by mouth daily.    . Cyanocobalamin (RA VITAMIN B-12 TR) 1000 MCG TBCR Take 1,000 mcg by mouth daily.     Marland Kitchen DHEA 25 MG CAPS Take 1 capsule by mouth 2 (two) times a week.    . Fish Oil-Cholecalciferol (FISH OIL + D3) 1000-1000 MG-UNIT CAPS Take 1,000  mg by mouth daily.     . folic acid (FOLVITE) 294 MCG tablet Take 800 mcg by mouth daily.     . hydroxyurea (HYDREA) 500 MG capsule TAKE 1 CAPSULE (500 MG TOTAL) BY MOUTH DAILY. MAY TAKE WITH FOOD TO MINIMIZE GI SIDE EFFECTS. 90 capsule 0  . L-Theanine 100 MG CAPS Take 100 mg by mouth at bedtime.    . Magnesium 200 MG TABS Take by mouth.    . Melatonin 3 MG TABS Take by mouth.    . Probiotic Product (PROBIOTIC  DAILY PO) Take 1 tablet by mouth daily.    . Riboflavin 400 MG CAPS Take by mouth.    Marland Kitchen tiZANidine (ZANAFLEX) 4 MG tablet Take by mouth once as needed.   11   No current facility-administered medications for this visit.    OBJECTIVE: Vitals:   12/25/19 1040  BP: 128/77  Pulse: 82  Temp: (!) 97.5 F (36.4 C)     Body mass index is 22.1 kg/m.    ECOG FS:0 - Asymptomatic  General: Well-developed, well-nourished, no acute distress. Eyes: Pink conjunctiva, anicteric sclera. HEENT: Normocephalic, moist mucous membranes. Lungs: No audible wheezing or coughing. Heart: Regular rate and rhythm. Abdomen: Soft, nontender, no obvious distention. Musculoskeletal: No edema, cyanosis, or clubbing. Neuro: Alert, answering all questions appropriately. Cranial nerves grossly intact. Skin: No rashes or petechiae noted. Psych: Normal affect.   LAB RESULTS:  Lab Results  Component Value Date   NA 138 12/25/2019   K 4.4 12/25/2019   CL 104 12/25/2019   CO2 25 12/25/2019   GLUCOSE 100 (H) 12/25/2019   BUN 23 12/25/2019   CREATININE 0.66 12/25/2019   CALCIUM 9.1 12/25/2019   PROT 6.7 12/25/2019   ALBUMIN 4.0 12/25/2019   AST 32 12/25/2019   ALT 25 12/25/2019   ALKPHOS 156 (H) 12/25/2019   BILITOT 1.6 (H) 12/25/2019   GFRNONAA >60 12/25/2019   GFRAA >60 12/25/2019    Lab Results  Component Value Date   WBC 18.6 (H) 12/25/2019   NEUTROABS 14.8 (H) 12/25/2019   HGB 17.1 (H) 12/25/2019   HCT 58.4 (H) 12/25/2019   MCV 89.7 12/25/2019   PLT 249 12/25/2019   Lab Results  Component Value Date   IRON 42 09/06/2019   TIBC 433 09/06/2019   IRONPCTSAT 10 (L) 09/06/2019    Lab Results  Component Value Date   FERRITIN 18 09/06/2019     STUDIES: No results found.  ASSESSMENT: JAK-2 positive, polycythemia vera, iron deficiency, heterozygous for hemochromatosis gene mutation H63D   PLAN:    1. Polycythemia vera: JAK-2 mutation is positive. She does not require phlebotomy, but  would consider in the future if necessary.  Patient's hemoglobin continues to trend up and is now 17.1.  Will increase patient's dose of Hydrea to 1000 mg on Monday, Wednesday, and Friday and continue with 500 mg the remainder of the week.  Return to clinic in 4 weeks for laboratory work only and then in 2 months for laboratory work and further evaluation.  2. Leukocytosis: Chronic and unchanged.  Previously, BCR/ABL was negative for underlying CML.  Repeat peripheral blood flow cytometry did not reveal any abnormality. Consider bone marrow biopsy in the future, but this is not necessary at this point.  Increase Hydrea as above. 3. Hemochromatosis: Patient is heterozygote, therefore this is likely clinically insignificant.  Her most recent ferritin is 18. 4.  Iron deficiency: Unusual in the setting of hemochromatosis.  Monitor. 5.  Hyperbilirubinemia: Unclear  etiology.  Possibly related to increased red cell destruction.  Monitor.   Patient expressed understanding and was in agreement with this plan. She also understands that She can call clinic at any time with any questions, concerns, or complaints.    Lloyd Huger, MD 12/25/19 11:58 AM

## 2019-12-24 ENCOUNTER — Other Ambulatory Visit: Payer: Self-pay

## 2019-12-24 NOTE — Progress Notes (Signed)
Patient pre screened for office appointment, no questions or concerns today. Patient reminded of upcoming appointment time and date. 

## 2019-12-25 ENCOUNTER — Other Ambulatory Visit: Payer: Self-pay

## 2019-12-25 ENCOUNTER — Inpatient Hospital Stay: Payer: Medicare Other | Attending: Oncology

## 2019-12-25 ENCOUNTER — Inpatient Hospital Stay (HOSPITAL_BASED_OUTPATIENT_CLINIC_OR_DEPARTMENT_OTHER): Payer: Medicare Other | Admitting: Oncology

## 2019-12-25 VITALS — BP 128/77 | HR 82 | Temp 97.5°F | Wt 141.1 lb

## 2019-12-25 DIAGNOSIS — Z79899 Other long term (current) drug therapy: Secondary | ICD-10-CM | POA: Insufficient documentation

## 2019-12-25 DIAGNOSIS — D72829 Elevated white blood cell count, unspecified: Secondary | ICD-10-CM | POA: Diagnosis not present

## 2019-12-25 DIAGNOSIS — I471 Supraventricular tachycardia: Secondary | ICD-10-CM | POA: Insufficient documentation

## 2019-12-25 DIAGNOSIS — E0590 Thyrotoxicosis, unspecified without thyrotoxic crisis or storm: Secondary | ICD-10-CM | POA: Diagnosis not present

## 2019-12-25 DIAGNOSIS — D509 Iron deficiency anemia, unspecified: Secondary | ICD-10-CM | POA: Insufficient documentation

## 2019-12-25 DIAGNOSIS — D45 Polycythemia vera: Secondary | ICD-10-CM

## 2019-12-25 LAB — CBC WITH DIFFERENTIAL/PLATELET
Abs Immature Granulocytes: 0.52 10*3/uL — ABNORMAL HIGH (ref 0.00–0.07)
Basophils Absolute: 0.3 10*3/uL — ABNORMAL HIGH (ref 0.0–0.1)
Basophils Relative: 2 %
Eosinophils Absolute: 0.5 10*3/uL (ref 0.0–0.5)
Eosinophils Relative: 3 %
HCT: 58.4 % — ABNORMAL HIGH (ref 36.0–46.0)
Hemoglobin: 17.1 g/dL — ABNORMAL HIGH (ref 12.0–15.0)
Immature Granulocytes: 3 %
Lymphocytes Relative: 10 %
Lymphs Abs: 1.8 10*3/uL (ref 0.7–4.0)
MCH: 26.3 pg (ref 26.0–34.0)
MCHC: 29.3 g/dL — ABNORMAL LOW (ref 30.0–36.0)
MCV: 89.7 fL (ref 80.0–100.0)
Monocytes Absolute: 0.7 10*3/uL (ref 0.1–1.0)
Monocytes Relative: 4 %
Neutro Abs: 14.8 10*3/uL — ABNORMAL HIGH (ref 1.7–7.7)
Neutrophils Relative %: 78 %
Platelets: 249 10*3/uL (ref 150–400)
RBC: 6.51 MIL/uL — ABNORMAL HIGH (ref 3.87–5.11)
RDW: 23.1 % — ABNORMAL HIGH (ref 11.5–15.5)
Smear Review: NORMAL
WBC: 18.6 10*3/uL — ABNORMAL HIGH (ref 4.0–10.5)
nRBC: 0.3 % — ABNORMAL HIGH (ref 0.0–0.2)

## 2019-12-25 LAB — COMPREHENSIVE METABOLIC PANEL WITH GFR
ALT: 25 U/L (ref 0–44)
AST: 32 U/L (ref 15–41)
Albumin: 4 g/dL (ref 3.5–5.0)
Alkaline Phosphatase: 156 U/L — ABNORMAL HIGH (ref 38–126)
Anion gap: 9 (ref 5–15)
BUN: 23 mg/dL (ref 8–23)
CO2: 25 mmol/L (ref 22–32)
Calcium: 9.1 mg/dL (ref 8.9–10.3)
Chloride: 104 mmol/L (ref 98–111)
Creatinine, Ser: 0.66 mg/dL (ref 0.44–1.00)
GFR calc Af Amer: >60 mL/min
GFR calc non Af Amer: >60 mL/min
Glucose, Bld: 100 mg/dL — ABNORMAL HIGH (ref 70–99)
Potassium: 4.4 mmol/L (ref 3.5–5.1)
Sodium: 138 mmol/L (ref 135–145)
Total Bilirubin: 1.6 mg/dL — ABNORMAL HIGH (ref 0.3–1.2)
Total Protein: 6.7 g/dL (ref 6.5–8.1)

## 2019-12-25 LAB — IRON AND TIBC
Iron: 139 ug/dL (ref 28–170)
Saturation Ratios: 31 % (ref 10.4–31.8)
TIBC: 444 ug/dL (ref 250–450)
UIBC: 305 ug/dL

## 2019-12-25 LAB — FERRITIN: Ferritin: 29 ng/mL (ref 11–307)

## 2019-12-25 NOTE — Progress Notes (Signed)
Pt in for follow up, reports no changes since speaking with nurse for pre assessment yesterday.

## 2020-01-24 ENCOUNTER — Other Ambulatory Visit: Payer: Self-pay

## 2020-01-24 ENCOUNTER — Inpatient Hospital Stay: Payer: Medicare Other | Attending: Oncology

## 2020-01-24 DIAGNOSIS — D45 Polycythemia vera: Secondary | ICD-10-CM

## 2020-01-24 LAB — COMPREHENSIVE METABOLIC PANEL
ALT: 27 U/L (ref 0–44)
AST: 31 U/L (ref 15–41)
Albumin: 4.1 g/dL (ref 3.5–5.0)
Alkaline Phosphatase: 131 U/L — ABNORMAL HIGH (ref 38–126)
Anion gap: 11 (ref 5–15)
BUN: 19 mg/dL (ref 8–23)
CO2: 24 mmol/L (ref 22–32)
Calcium: 9.2 mg/dL (ref 8.9–10.3)
Chloride: 103 mmol/L (ref 98–111)
Creatinine, Ser: 0.64 mg/dL (ref 0.44–1.00)
GFR calc Af Amer: >60 mL/min (ref >60)
GFR calc non Af Amer: >60 mL/min (ref >60)
Glucose, Bld: 92 mg/dL (ref 70–99)
Potassium: 4.5 mmol/L (ref 3.5–5.1)
Sodium: 138 mmol/L (ref 135–145)
Total Bilirubin: 1.1 mg/dL (ref 0.3–1.2)
Total Protein: 6.8 g/dL (ref 6.5–8.1)

## 2020-01-24 LAB — CBC WITH DIFFERENTIAL/PLATELET
Abs Immature Granulocytes: 0.23 10*3/uL — ABNORMAL HIGH (ref 0.00–0.07)
Basophils Absolute: 0.2 10*3/uL — ABNORMAL HIGH (ref 0.0–0.1)
Basophils Relative: 1 %
Eosinophils Absolute: 0.4 10*3/uL (ref 0.0–0.5)
Eosinophils Relative: 3 %
HCT: 59.7 % — ABNORMAL HIGH (ref 36.0–46.0)
Hemoglobin: 17.4 g/dL — ABNORMAL HIGH (ref 12.0–15.0)
Immature Granulocytes: 1 %
Lymphocytes Relative: 10 %
Lymphs Abs: 1.7 10*3/uL (ref 0.7–4.0)
MCH: 26.8 pg (ref 26.0–34.0)
MCHC: 29.1 g/dL — ABNORMAL LOW (ref 30.0–36.0)
MCV: 92 fL (ref 80.0–100.0)
Monocytes Absolute: 0.4 10*3/uL (ref 0.1–1.0)
Monocytes Relative: 3 %
Neutro Abs: 13.1 10*3/uL — ABNORMAL HIGH (ref 1.7–7.7)
Neutrophils Relative %: 82 %
Platelets: 207 10*3/uL (ref 150–400)
RBC: 6.49 MIL/uL — ABNORMAL HIGH (ref 3.87–5.11)
RDW: 23.9 % — ABNORMAL HIGH (ref 11.5–15.5)
WBC: 16 10*3/uL — ABNORMAL HIGH (ref 4.0–10.5)
nRBC: 0.2 % (ref 0.0–0.2)

## 2020-01-24 LAB — IRON AND TIBC
Iron: 68 ug/dL (ref 28–170)
Saturation Ratios: 15 % (ref 10.4–31.8)
TIBC: 451 ug/dL — ABNORMAL HIGH (ref 250–450)
UIBC: 383 ug/dL

## 2020-01-24 LAB — FERRITIN: Ferritin: 24 ng/mL (ref 11–307)

## 2020-02-24 NOTE — Progress Notes (Signed)
[place]  Telephone:(336) [phone] Fax:(336) [phone]  ID: Jeryl Columbia OB: [birth year]  MR#: [id number]  BBC#:[id number]  Patient Care Team: Idelle Crouch, MD as PCP - General (Internal Medicine) Lloyd Huger, MD as Consulting Physician (Hematology and Oncology)   CHIEF COMPLAINT: JAK-2 positive, polycythemia vera, iron deficiency, heterozygous for hemochromatosis gene mutation H63D   INTERVAL HISTORY: Patient returns to clinic today for repeat laboratory work, further evaluation, and to assess her toleration of higher dose Hydrea.  Patient states she gets a headache on Mondays after taking the increased dose, but with the remainder of the week she feels fine.  She is taking charcoal tablets for her headache.  She has no other neurologic complaints. She denies any fevers, night sweats, or weight loss.  She denies any chest pain, shortness of breath, cough, or hemoptysis.  She denies any nausea, vomiting, constipation, or diarrhea. She has no urinary complaints.  Patient offers no further specific complaints today.  REVIEW OF SYSTEMS:   Review of Systems  Constitutional: Negative for fever, malaise/fatigue and weight loss.  HENT: Negative for congestion and sinus pain.   Respiratory: Negative.  Negative for cough and shortness of breath.   Cardiovascular: Negative.  Negative for chest pain and leg swelling.  Gastrointestinal: Negative.  Negative for abdominal pain, constipation, diarrhea, nausea and vomiting.  Genitourinary: Negative.   Musculoskeletal: Negative.   Neurological: Positive for headaches. Negative for dizziness and weakness.  Endo/Heme/Allergies: Does not bruise/bleed easily.  Psychiatric/Behavioral: Negative.  The patient is not nervous/anxious and does not have insomnia.     As per HPI. Otherwise, a complete review of systems is negative.  PAST MEDICAL HISTORY: Past Medical History:  Diagnosis Date  . Abnormality of gait 03/19/2016   . Hyperthyroidism   . Polio   . PSVT (paroxysmal supraventricular tachycardia) ([place])     PAST SURGICAL HISTORY: Past Surgical History:  Procedure Laterality Date  . BREAST BIOPSY Right 1999   EXCISIONAL - NEG  . BREAST BIOPSY Right 2012   EXCISIONAL - NEG  . BREAST SURGERY     breast biopsies  . COLONOSCOPY WITH PROPOFOL N/A 07/04/2015   Procedure: COLONOSCOPY WITH PROPOFOL;  Surgeon: Manya Silvas, MD;  Location: [facility] ENDOSCOPY;  Service: Endoscopy;  Laterality: N/A;  . ELECTROPHYSIOLOGIC STUDY N/A 12/16/2015   Procedure: SVT Ablation;  Surgeon: Evans Lance, MD;  Location: [place] [facility];  Service: Cardiovascular;  Laterality: N/A;  . TONSILLECTOMY      FAMILY HISTORY: Reviewed and unchanged. No reported history of malignancy or chronic disease.     ADVANCED DIRECTIVES:    HEALTH MAINTENANCE: Social History   Tobacco Use  . Smoking status: Never Smoker  . Smokeless tobacco: Never Used  Substance Use Topics  . Alcohol use: No  . Drug use: Not on file     Colonoscopy:  PAP:  Bone density:  Lipid panel:  Allergies  Allergen Reactions  . Bee Venom Anaphylaxis  . Caffeine     Fever, chills, withdrawal symptoms    Current Outpatient Medications  Medication Sig Dispense Refill  . ARMOUR THYROID 60 MG tablet Take 60 mg by mouth daily.  4  . cholecalciferol (VITAMIN D) 1000 units tablet Take 1,000 Units by mouth daily.    . Cyanocobalamin (RA VITAMIN B-12 TR) 1000 MCG TBCR Take 1,000 mcg by mouth daily.     Marland Kitchen DHEA 25 MG CAPS Take 1 capsule by mouth 2 (two) times a week.    Marland Kitchen  Fish Oil-Cholecalciferol (FISH OIL + D3) 1000-1000 MG-UNIT CAPS Take 1,000 mg by mouth daily.     . folic acid (FOLVITE) 824 MCG tablet Take 800 mcg by mouth daily.     . hydroxyurea (HYDREA) 500 MG capsule TAKE 1 CAPSULE (500 MG TOTAL) BY MOUTH DAILY. MAY TAKE WITH FOOD TO MINIMIZE GI SIDE EFFECTS. 90 capsule 0  . L-Theanine 100 MG CAPS Take 100 mg by mouth at bedtime.    .  Magnesium 200 MG TABS Take by mouth.    . Melatonin 3 MG TABS Take by mouth.    . Probiotic Product (PROBIOTIC DAILY PO) Take 1 tablet by mouth daily.    . Riboflavin 400 MG CAPS Take by mouth.    Marland Kitchen tiZANidine (ZANAFLEX) 4 MG tablet Take by mouth once as needed.   11  . calcium carbonate (OSCAL) 1500 (600 CA) MG TABS tablet Take 1,200 mg by mouth 2 (two) times a week.      No current facility-administered medications for this visit.    OBJECTIVE: Vitals:   02/25/20 1051  BP: 121/77  Pulse: 85  Resp: 18  Temp: (!) 97.4 F (36.3 C)  SpO2: 100%     Body mass index is 22.15 kg/m.    ECOG FS:0 - Asymptomatic  General: Well-developed, well-nourished, no acute distress. Eyes: Pink conjunctiva, anicteric sclera. HEENT: Normocephalic, moist mucous membranes. Lungs: No audible wheezing or coughing. Heart: Regular rate and rhythm. Abdomen: Soft, nontender, no obvious distention. Musculoskeletal: No edema, cyanosis, or clubbing. Neuro: Alert, answering all questions appropriately. Cranial nerves grossly intact. Skin: No rashes or petechiae noted. Psych: Normal affect.   LAB RESULTS:  Lab Results  Component Value Date   NA 138 02/25/2020   K 4.2 02/25/2020   CL 102 02/25/2020   CO2 26 02/25/2020   GLUCOSE 88 02/25/2020   BUN 20 02/25/2020   CREATININE 0.56 02/25/2020   CALCIUM 9.2 02/25/2020   PROT 7.0 02/25/2020   ALBUMIN 4.2 02/25/2020   AST 25 02/25/2020   ALT 22 02/25/2020   ALKPHOS 120 02/25/2020   BILITOT 1.2 02/25/2020   GFRNONAA >60 02/25/2020   GFRAA >60 02/25/2020    Lab Results  Component Value Date   WBC 13.8 (H) 02/25/2020   NEUTROABS 11.3 (H) 02/25/2020   HGB 17.7 (H) 02/25/2020   HCT 59.6 (H) 02/25/2020   MCV 93.7 02/25/2020   PLT 193 02/25/2020   Lab Results  Component Value Date   IRON 84 02/25/2020   TIBC 430 02/25/2020   IRONPCTSAT 20 02/25/2020    Lab Results  Component Value Date   FERRITIN 28 02/25/2020     STUDIES: No results  found.  ASSESSMENT: JAK-2 positive, polycythemia vera, iron deficiency, heterozygous for hemochromatosis gene mutation H63D   PLAN:    1. Polycythemia vera: JAK-2 mutation is positive. She does not require phlebotomy, but would consider in the future if necessary.  Despite increasing Hydrea to 1000 mg on Monday, Wednesday, and Friday and continue with 500 mg the remainder of the week, patient's hemoglobin is trended up to 17.7.  Because she is taking charcoal tabs for her headache, this may be chelating her Hydrea dose and she may not be receiving full dose.  Have instructed patient to discontinue charcoal as well as switch her increased dose of Hydrea to Tuesdays, Thursdays, and Saturdays to assess if there is any difference in the timing of her headache.  Return to clinic in 4 weeks for repeat laboratory work and  further evaluation. 2. Leukocytosis: Chronic and unchanged.  Previously, BCR/ABL was negative for underlying CML.  Repeat peripheral blood flow cytometry did not reveal any abnormality. Consider bone marrow biopsy in the future, but this is not necessary at this point.  Increase Hydrea as above. 3. Hemochromatosis: Patient is heterozygote, therefore this is likely clinically insignificant.  Her most recent ferritin is 28. 4.  Iron deficiency: Resolved. 5.  Hyperbilirubinemia: Resolved.   Patient expressed understanding and was in agreement with this plan. She also understands that She can call clinic at any time with any questions, concerns, or complaints.    Lloyd Huger, MD 02/25/20 4:20 PM

## 2020-02-25 ENCOUNTER — Other Ambulatory Visit: Payer: Self-pay

## 2020-02-25 ENCOUNTER — Inpatient Hospital Stay (HOSPITAL_BASED_OUTPATIENT_CLINIC_OR_DEPARTMENT_OTHER): Payer: Medicare Other | Admitting: Oncology

## 2020-02-25 ENCOUNTER — Inpatient Hospital Stay: Payer: Medicare Other | Attending: Oncology

## 2020-02-25 VITALS — BP 121/77 | HR 85 | Temp 97.4°F | Resp 18 | Wt 141.4 lb

## 2020-02-25 DIAGNOSIS — D509 Iron deficiency anemia, unspecified: Secondary | ICD-10-CM | POA: Diagnosis not present

## 2020-02-25 DIAGNOSIS — E611 Iron deficiency: Secondary | ICD-10-CM | POA: Diagnosis not present

## 2020-02-25 DIAGNOSIS — D45 Polycythemia vera: Secondary | ICD-10-CM

## 2020-02-25 DIAGNOSIS — Z79899 Other long term (current) drug therapy: Secondary | ICD-10-CM | POA: Diagnosis not present

## 2020-02-25 DIAGNOSIS — D72829 Elevated white blood cell count, unspecified: Secondary | ICD-10-CM | POA: Insufficient documentation

## 2020-02-25 LAB — COMPREHENSIVE METABOLIC PANEL
ALT: 22 U/L (ref 0–44)
AST: 25 U/L (ref 15–41)
Albumin: 4.2 g/dL (ref 3.5–5.0)
Alkaline Phosphatase: 120 U/L (ref 38–126)
Anion gap: 10 (ref 5–15)
BUN: 20 mg/dL (ref 8–23)
CO2: 26 mmol/L (ref 22–32)
Calcium: 9.2 mg/dL (ref 8.9–10.3)
Chloride: 102 mmol/L (ref 98–111)
Creatinine, Ser: 0.56 mg/dL (ref 0.44–1.00)
GFR calc Af Amer: >60 mL/min (ref >60)
GFR calc non Af Amer: >60 mL/min (ref >60)
Glucose, Bld: 88 mg/dL (ref 70–99)
Potassium: 4.2 mmol/L (ref 3.5–5.1)
Sodium: 138 mmol/L (ref 135–145)
Total Bilirubin: 1.2 mg/dL (ref 0.3–1.2)
Total Protein: 7 g/dL (ref 6.5–8.1)

## 2020-02-25 LAB — CBC WITH DIFFERENTIAL/PLATELET
Abs Immature Granulocytes: 0.16 10*3/uL — ABNORMAL HIGH (ref 0.00–0.07)
Basophils Absolute: 0.2 10*3/uL — ABNORMAL HIGH (ref 0.0–0.1)
Basophils Relative: 1 %
Eosinophils Absolute: 0.3 10*3/uL (ref 0.0–0.5)
Eosinophils Relative: 2 %
HCT: 59.6 % — ABNORMAL HIGH (ref 36.0–46.0)
Hemoglobin: 17.7 g/dL — ABNORMAL HIGH (ref 12.0–15.0)
Immature Granulocytes: 1 %
Lymphocytes Relative: 10 %
Lymphs Abs: 1.3 10*3/uL (ref 0.7–4.0)
MCH: 27.8 pg (ref 26.0–34.0)
MCHC: 29.7 g/dL — ABNORMAL LOW (ref 30.0–36.0)
MCV: 93.7 fL (ref 80.0–100.0)
Monocytes Absolute: 0.6 10*3/uL (ref 0.1–1.0)
Monocytes Relative: 4 %
Neutro Abs: 11.3 10*3/uL — ABNORMAL HIGH (ref 1.7–7.7)
Neutrophils Relative %: 82 %
Platelets: 193 10*3/uL (ref 150–400)
RBC: 6.36 MIL/uL — ABNORMAL HIGH (ref 3.87–5.11)
RDW: 24.1 % — ABNORMAL HIGH (ref 11.5–15.5)
WBC: 13.8 10*3/uL — ABNORMAL HIGH (ref 4.0–10.5)
nRBC: 0.1 % (ref 0.0–0.2)

## 2020-02-25 LAB — IRON AND TIBC
Iron: 84 ug/dL (ref 28–170)
Saturation Ratios: 20 % (ref 10.4–31.8)
TIBC: 430 ug/dL (ref 250–450)
UIBC: 346 ug/dL

## 2020-02-25 LAB — FERRITIN: Ferritin: 28 ng/mL (ref 11–307)

## 2020-02-27 ENCOUNTER — Other Ambulatory Visit: Payer: Self-pay | Admitting: Oncology

## 2020-03-20 NOTE — Progress Notes (Signed)
[facility]  Telephone:(336) [phone] Fax:(336) [phone]  ID: Priscilla Berry OB: 1946/12/14  MR#: [id number]  MIW#:[id number]  Patient Care Team: Idelle Crouch, MD as PCP - General (Internal Medicine) Lloyd Huger, MD as Consulting Physician (Hematology and Oncology)   CHIEF COMPLAINT: JAK-2 positive, polycythemia vera, iron deficiency, heterozygous for hemochromatosis gene mutation H63D   INTERVAL HISTORY: Patient returns to clinic today for repeat laboratory work and further evaluation.  She is now tolerating Hydrea well without significant side effects.  She no longer complains of headache.  She currently feels well and is asymptomatic.  She has no neurologic complaints.  She denies any fevers, night sweats, or weight loss.  She denies any chest pain, shortness of breath, cough, or hemoptysis.  She denies any nausea, vomiting, constipation, or diarrhea. She has no urinary complaints.  Patient offers no specific complaints today.  REVIEW OF SYSTEMS:   Review of Systems  Constitutional: Negative.  Negative for fever, malaise/fatigue and weight loss.  HENT: Negative.  Negative for congestion and sinus pain.   Respiratory: Negative.  Negative for cough and shortness of breath.   Cardiovascular: Negative.  Negative for chest pain and leg swelling.  Gastrointestinal: Negative.  Negative for abdominal pain, constipation, diarrhea, nausea and vomiting.  Genitourinary: Negative.  Negative for dysuria.  Musculoskeletal: Negative.  Negative for back pain.  Neurological: Negative.  Negative for dizziness, weakness and headaches.  Endo/Heme/Allergies: Does not bruise/bleed easily.  Psychiatric/Behavioral: Negative.  The patient is not nervous/anxious and does not have insomnia.     As per HPI. Otherwise, a complete review of systems is negative.  PAST MEDICAL HISTORY: Past Medical History:  Diagnosis Date  . Abnormality of gait 03/19/2016  . Hyperthyroidism     . Polio   . PSVT (paroxysmal supraventricular tachycardia) ([place])     PAST SURGICAL HISTORY: Past Surgical History:  Procedure Laterality Date  . BREAST BIOPSY Right 1999   EXCISIONAL - NEG  . BREAST BIOPSY Right 2012   EXCISIONAL - NEG  . BREAST SURGERY     breast biopsies  . COLONOSCOPY WITH PROPOFOL N/A 07/04/2015   Procedure: COLONOSCOPY WITH PROPOFOL;  Surgeon: Manya Silvas, MD;  Location: [facility] ENDOSCOPY;  Service: Endoscopy;  Laterality: N/A;  . ELECTROPHYSIOLOGIC STUDY N/A 12/16/2015   Procedure: SVT Ablation;  Surgeon: Evans Lance, MD;  Location: [place] [facility] LAB;  Service: Cardiovascular;  Laterality: N/A;  . TONSILLECTOMY      FAMILY HISTORY: Reviewed and unchanged. No reported history of malignancy or chronic disease.     ADVANCED DIRECTIVES:    HEALTH MAINTENANCE: Social History   Tobacco Use  . Smoking status: Never Smoker  . Smokeless tobacco: Never Used  Substance Use Topics  . Alcohol use: No  . Drug use: Not on file     Colonoscopy:  PAP:  Bone density:  Lipid panel:  Allergies  Allergen Reactions  . Bee Venom Anaphylaxis  . Caffeine     Fever, chills, withdrawal symptoms    Current Outpatient Medications  Medication Sig Dispense Refill  . ARMOUR THYROID 60 MG tablet Take 60 mg by mouth daily.  4  . calcium carbonate (OSCAL) 1500 (600 CA) MG TABS tablet Take 1,200 mg by mouth 2 (two) times a week.     . cholecalciferol (VITAMIN D) 1000 units tablet Take 1,000 Units by mouth daily.    . Cyanocobalamin (RA VITAMIN B-12 TR) 1000 MCG TBCR Take 1,000 mcg by mouth daily.     Marland Kitchen  DHEA 25 MG CAPS Take 1 capsule by mouth 2 (two) times a week.    . Fish Oil-Cholecalciferol (FISH OIL + D3) 1000-1000 MG-UNIT CAPS Take 1,000 mg by mouth daily.     . folic acid (FOLVITE) 462 MCG tablet Take 800 mcg by mouth daily.     . hydroxyurea (HYDREA) 500 MG capsule TAKE 1 CAPSULE (500 MG TOTAL) BY MOUTH DAILY. MAY TAKE WITH FOOD TO MINIMIZE GI SIDE EFFECTS.  90 capsule 0  . L-Theanine 100 MG CAPS Take 100 mg by mouth at bedtime.    . Magnesium 200 MG TABS Take by mouth.    . Melatonin 3 MG TABS Take by mouth.    . Probiotic Product (PROBIOTIC DAILY PO) Take 1 tablet by mouth daily.    . Riboflavin 400 MG CAPS Take by mouth.    Marland Kitchen tiZANidine (ZANAFLEX) 4 MG tablet Take by mouth once as needed.   11   No current facility-administered medications for this visit.    OBJECTIVE: Vitals:   03/24/20 1110  BP: 139/76  Pulse: 83  Resp: 17  Temp: (!) 96.6 F (35.9 C)  SpO2: 98%     Body mass index is 22.33 kg/m.    ECOG FS:0 - Asymptomatic  General: Well-developed, well-nourished, no acute distress. Eyes: Pink conjunctiva, anicteric sclera. HEENT: Normocephalic, moist mucous membranes. Lungs: No audible wheezing or coughing. Heart: Regular rate and rhythm. Abdomen: Soft, nontender, no obvious distention. Musculoskeletal: No edema, cyanosis, or clubbing. Neuro: Alert, answering all questions appropriately. Cranial nerves grossly intact. Skin: No rashes or petechiae noted. Psych: Normal affect.   LAB RESULTS:  Lab Results  Component Value Date   NA 140 03/24/2020   K 4.5 03/24/2020   CL 106 03/24/2020   CO2 26 03/24/2020   GLUCOSE 100 (H) 03/24/2020   BUN 18 03/24/2020   CREATININE 0.56 03/24/2020   CALCIUM 8.8 (L) 03/24/2020   PROT 6.6 03/24/2020   ALBUMIN 4.0 03/24/2020   AST 39 03/24/2020   ALT 36 03/24/2020   ALKPHOS 122 03/24/2020   BILITOT 1.0 03/24/2020   GFRNONAA >60 03/24/2020   GFRAA >60 03/24/2020    Lab Results  Component Value Date   WBC 11.9 (H) 03/24/2020   NEUTROABS 9.3 (H) 03/24/2020   HGB 16.6 (H) 03/24/2020   HCT 52.7 (H) 03/24/2020   MCV 95.3 03/24/2020   PLT 175 03/24/2020   Lab Results  Component Value Date   IRON 132 03/24/2020   TIBC 297 03/24/2020   IRONPCTSAT 45 (H) 03/24/2020    Lab Results  Component Value Date   FERRITIN 155 03/24/2020     STUDIES: No results  found.  ASSESSMENT: JAK-2 positive, polycythemia vera, iron deficiency, heterozygous for hemochromatosis gene mutation H63D   PLAN:    1. Polycythemia vera: JAK-2 mutation is positive. She does not require phlebotomy, but would consider in the future if necessary.  Patient has now discontinued charcoal tabs and is taking an increased dose of Hydrea of 1000 mg on Tuesday, Thursday, and Saturday with 500 mg the remainder of the week.  Hemoglobin has significantly improved and is now 16.6.  No changes are needed to her current regimen.  Return to clinic in 6 weeks for laboratory work only and then in 3 months for laboratory work and further evaluation.   2. Leukocytosis: Chronic and unchanged.  Previously, BCR/ABL was negative for underlying CML.  Repeat peripheral blood flow cytometry did not reveal any abnormality. Consider bone marrow biopsy in the  future, but this is not necessary at this point.  Increase Hydrea as above. 3. Hemochromatosis: Patient is heterozygote, therefore this is likely clinically insignificant.  Her most recent ferritin is 155.   4.  Iron deficiency: Resolved. 5.  Hyperbilirubinemia: Resolved.   Patient expressed understanding and was in agreement with this plan. She also understands that She can call clinic at any time with any questions, concerns, or complaints.    Lloyd Huger, MD 03/24/20 1:16 PM

## 2020-03-24 ENCOUNTER — Inpatient Hospital Stay: Payer: Medicare Other | Attending: Oncology

## 2020-03-24 ENCOUNTER — Inpatient Hospital Stay (HOSPITAL_BASED_OUTPATIENT_CLINIC_OR_DEPARTMENT_OTHER): Payer: Medicare Other | Admitting: Oncology

## 2020-03-24 ENCOUNTER — Encounter: Payer: Self-pay | Admitting: Oncology

## 2020-03-24 ENCOUNTER — Other Ambulatory Visit: Payer: Self-pay

## 2020-03-24 VITALS — BP 139/76 | HR 83 | Temp 96.6°F | Resp 17 | Wt 142.6 lb

## 2020-03-24 DIAGNOSIS — D509 Iron deficiency anemia, unspecified: Secondary | ICD-10-CM | POA: Insufficient documentation

## 2020-03-24 DIAGNOSIS — D45 Polycythemia vera: Secondary | ICD-10-CM | POA: Diagnosis not present

## 2020-03-24 DIAGNOSIS — Z79899 Other long term (current) drug therapy: Secondary | ICD-10-CM | POA: Diagnosis not present

## 2020-03-24 DIAGNOSIS — I471 Supraventricular tachycardia: Secondary | ICD-10-CM | POA: Diagnosis not present

## 2020-03-24 LAB — CBC WITH DIFFERENTIAL/PLATELET
Abs Immature Granulocytes: 0.17 10*3/uL — ABNORMAL HIGH (ref 0.00–0.07)
Basophils Absolute: 0.1 10*3/uL (ref 0.0–0.1)
Basophils Relative: 1 %
Eosinophils Absolute: 0.3 10*3/uL (ref 0.0–0.5)
Eosinophils Relative: 3 %
HCT: 52.7 % — ABNORMAL HIGH (ref 36.0–46.0)
Hemoglobin: 16.6 g/dL — ABNORMAL HIGH (ref 12.0–15.0)
Immature Granulocytes: 1 %
Lymphocytes Relative: 13 %
Lymphs Abs: 1.5 10*3/uL (ref 0.7–4.0)
MCH: 30 pg (ref 26.0–34.0)
MCHC: 31.5 g/dL (ref 30.0–36.0)
MCV: 95.3 fL (ref 80.0–100.0)
Monocytes Absolute: 0.5 10*3/uL (ref 0.1–1.0)
Monocytes Relative: 4 %
Neutro Abs: 9.3 10*3/uL — ABNORMAL HIGH (ref 1.7–7.7)
Neutrophils Relative %: 78 %
Platelets: 175 10*3/uL (ref 150–400)
RBC: 5.53 MIL/uL — ABNORMAL HIGH (ref 3.87–5.11)
RDW: 23.1 % — ABNORMAL HIGH (ref 11.5–15.5)
WBC: 11.9 10*3/uL — ABNORMAL HIGH (ref 4.0–10.5)
nRBC: 0 % (ref 0.0–0.2)

## 2020-03-24 LAB — COMPREHENSIVE METABOLIC PANEL
ALT: 36 U/L (ref 0–44)
AST: 39 U/L (ref 15–41)
Albumin: 4 g/dL (ref 3.5–5.0)
Alkaline Phosphatase: 122 U/L (ref 38–126)
Anion gap: 8 (ref 5–15)
BUN: 18 mg/dL (ref 8–23)
CO2: 26 mmol/L (ref 22–32)
Calcium: 8.8 mg/dL — ABNORMAL LOW (ref 8.9–10.3)
Chloride: 106 mmol/L (ref 98–111)
Creatinine, Ser: 0.56 mg/dL (ref 0.44–1.00)
GFR calc Af Amer: >60 mL/min (ref >60)
GFR calc non Af Amer: >60 mL/min (ref >60)
Glucose, Bld: 100 mg/dL — ABNORMAL HIGH (ref 70–99)
Potassium: 4.5 mmol/L (ref 3.5–5.1)
Sodium: 140 mmol/L (ref 135–145)
Total Bilirubin: 1 mg/dL (ref 0.3–1.2)
Total Protein: 6.6 g/dL (ref 6.5–8.1)

## 2020-03-24 LAB — IRON AND TIBC
Iron: 132 ug/dL (ref 28–170)
Saturation Ratios: 45 % — ABNORMAL HIGH (ref 10.4–31.8)
TIBC: 297 ug/dL (ref 250–450)
UIBC: 165 ug/dL

## 2020-03-24 LAB — FERRITIN: Ferritin: 155 ng/mL (ref 11–307)

## 2020-03-24 NOTE — Progress Notes (Signed)
Pt here for follow up. Reports feeling well, no complaints or concerns.

## 2020-05-02 ENCOUNTER — Other Ambulatory Visit: Payer: Self-pay | Admitting: Oncology

## 2020-05-02 ENCOUNTER — Other Ambulatory Visit: Payer: Self-pay | Admitting: Emergency Medicine

## 2020-05-02 DIAGNOSIS — D45 Polycythemia vera: Secondary | ICD-10-CM

## 2020-05-02 NOTE — Telephone Encounter (Signed)
CBC with Differential Order: [id number] Status:  Final result  Visible to patient:  No (inaccessible in MyChart)  Next appt:  05/05/2020 at 11:30 AM in Oncology ([facility])  Dx:  Polycythemia vera ([place])  Ref Range & Units 1 mo ago  WBC 4.0 - 10.5 K/uL 11.9High    RBC 3.87 - 5.11 MIL/uL 5.53High    Hemoglobin 12.0 - 15.0 g/dL 16.6High    HCT 36.0 - 46.0 % 52.7High    MCV 80.0 - 100.0 fL 95.3   MCH 26.0 - 34.0 pg 30.0   MCHC 30.0 - 36.0 g/dL 31.5   RDW 11.5 - 15.5 % 23.1High    Platelets 150 - 400 K/uL 175   nRBC 0.0 - 0.2 % 0.0   Neutrophils Relative % % 78   Neutro Abs 1.7 - 7.7 K/uL 9.3High    Lymphocytes Relative % 13   Lymphs Abs 0.7 - 4.0 K/uL 1.5   Monocytes Relative % 4   Monocytes Absolute 0.1 - 1.0 K/uL 0.5   Eosinophils Relative % 3   Eosinophils Absolute 0.0 - 0.5 K/uL 0.3   Basophils Relative % 1   Basophils Absolute 0.0 - 0.1 K/uL 0.1   Immature Granulocytes % 1   Abs Immature Granulocytes 0.00 - 0.07 K/uL 0.17High    Comment: Performed at [facility], [address] [place], [place] [address]  Resulting Agency  [facility] CLIN LAB      Specimen Collected: 03/24/20 10:15 Last Resulted: 03/24/20 10:30     Lab Flowsheet   Order Details   View Encounter   Lab and Collection Details   Routing   Result History         Other Results from 03/24/2020  Contains abnormal data Comprehensive metabolic panel  Status:  Final result  Visible to patient:  No (inaccessible in MyChart)  Next appt:  05/05/2020 at 11:30 AM in Oncology ([facility])  Dx:  Polycythemia vera ([place]) Order: [id number]  Ref Range & Units 1 mo ago  Sodium 135 - 145 mmol/L 140   Potassium 3.5 - 5.1 mmol/L 4.5   Chloride 98 - 111 mmol/L 106   CO2 22 - 32 mmol/L 26   Glucose, Bld 70 - 99 mg/dL 100High    Comment: Glucose reference range applies only to samples taken after fasting for at least 8 hours.  BUN 8 - 23 mg/dL 18   Creatinine, Ser 0.44 - 1.00 mg/dL 0.56   Calcium 8.9 - 10.3  mg/dL 8.8Low    Total Protein 6.5 - 8.1 g/dL 6.6   Albumin 3.5 - 5.0 g/dL 4.0   AST 15 - 41 U/L 39   ALT 0 - 44 U/L 36   Alkaline Phosphatase 38 - 126 U/L 122   Total Bilirubin 0.3 - 1.2 mg/dL 1.0   GFR calc non Af Amer >60 mL/min >60   GFR calc Af Amer >60 mL/min >60   Anion gap 5 - 15 8   Comment: Performed at [facility], [address] [place], [place] [address]  Resulting Agency  [facility] CLIN LAB      Specimen Collected: 03/24/20 10:15 Last Resulted: 03/24/20 10:35     Lab Flowsheet   Order Details   View Encounter   Lab and Collection Details   Routing   Result History           Contains abnormal data Iron and TIBC  Status:  Final result  Visible to patient:  No (inaccessible in MyChart)  Next appt:  05/05/2020 at 11:30 AM in Oncology ([facility])  Dx:  Polycythemia vera ([place]) Order: [id number]  Ref Range & Units 1 mo ago  Iron 28 - 170 ug/dL 132   TIBC 250 - 450 ug/dL 297   Saturation Ratios 10.4 - 31.8 % 45High    UIBC ug/dL 165   Comment: Performed at [facility], [address] [place], [place] [address]  Resulting Agency  [facility] CLIN LAB      Specimen Collected: 03/24/20 10:15 Last Resulted: 03/24/20 11:49     Lab Flowsheet   Order Details   View Encounter   Lab and Collection Details   Routing   Result History           Ferritin  Status:  Final result  Visible to patient:  No (inaccessible in MyChart)  Next appt:  05/05/2020 at 11:30 AM in Oncology ([facility])  Dx:  Polycythemia vera ([place]) Order: [id number]  Ref Range & Units 1 mo ago  Ferritin 11 - 307 ng/mL 155   Comment: Performed at [facility], [address] [place], [place] [address]  Resulting Agency  [facility] CLIN LAB      Specimen Collected: 03/24/20 10:15 Last Resulted: 03/24/20 11:49

## 2020-05-05 ENCOUNTER — Inpatient Hospital Stay: Payer: Medicare Other

## 2020-05-13 ENCOUNTER — Inpatient Hospital Stay: Payer: Medicare Other | Attending: Oncology

## 2020-05-13 ENCOUNTER — Other Ambulatory Visit: Payer: Self-pay

## 2020-05-13 DIAGNOSIS — D45 Polycythemia vera: Secondary | ICD-10-CM

## 2020-05-13 LAB — CBC WITH DIFFERENTIAL/PLATELET
Abs Immature Granulocytes: 0.2 10*3/uL — ABNORMAL HIGH (ref 0.00–0.07)
Band Neutrophils: 1 %
Basophils Absolute: 0.1 10*3/uL (ref 0.0–0.1)
Basophils Relative: 1 %
Eosinophils Absolute: 0.2 10*3/uL (ref 0.0–0.5)
Eosinophils Relative: 2 %
HCT: 39.6 % (ref 36.0–46.0)
Hemoglobin: 12.9 g/dL (ref 12.0–15.0)
Lymphocytes Relative: 12 %
Lymphs Abs: 1.3 10*3/uL (ref 0.7–4.0)
MCH: 33.6 pg (ref 26.0–34.0)
MCHC: 32.6 g/dL (ref 30.0–36.0)
MCV: 103.1 fL — ABNORMAL HIGH (ref 80.0–100.0)
Metamyelocytes Relative: 1 %
Monocytes Absolute: 0.3 10*3/uL (ref 0.1–1.0)
Monocytes Relative: 3 %
Myelocytes: 1 %
Neutro Abs: 9 10*3/uL — ABNORMAL HIGH (ref 1.7–7.7)
Neutrophils Relative %: 79 %
Platelets: 228 10*3/uL (ref 150–400)
RBC: 3.84 MIL/uL — ABNORMAL LOW (ref 3.87–5.11)
RDW: 21.2 % — ABNORMAL HIGH (ref 11.5–15.5)
Smear Review: ADEQUATE
WBC: 11.2 10*3/uL — ABNORMAL HIGH (ref 4.0–10.5)
nRBC: 0 % (ref 0.0–0.2)

## 2020-05-13 LAB — COMPREHENSIVE METABOLIC PANEL
ALT: 28 U/L (ref 0–44)
AST: 29 U/L (ref 15–41)
Albumin: 4 g/dL (ref 3.5–5.0)
Alkaline Phosphatase: 109 U/L (ref 38–126)
Anion gap: 8 (ref 5–15)
BUN: 16 mg/dL (ref 8–23)
CO2: 26 mmol/L (ref 22–32)
Calcium: 8.8 mg/dL — ABNORMAL LOW (ref 8.9–10.3)
Chloride: 104 mmol/L (ref 98–111)
Creatinine, Ser: 0.52 mg/dL (ref 0.44–1.00)
GFR calc Af Amer: >60 mL/min (ref >60)
GFR calc non Af Amer: >60 mL/min (ref >60)
Glucose, Bld: 111 mg/dL — ABNORMAL HIGH (ref 70–99)
Potassium: 4.4 mmol/L (ref 3.5–5.1)
Sodium: 138 mmol/L (ref 135–145)
Total Bilirubin: 1 mg/dL (ref 0.3–1.2)
Total Protein: 6.5 g/dL (ref 6.5–8.1)

## 2020-05-13 LAB — FERRITIN: Ferritin: 383 ng/mL — ABNORMAL HIGH (ref 11–307)

## 2020-05-13 LAB — IRON AND TIBC
Iron: 115 ug/dL (ref 28–170)
Saturation Ratios: 39 % — ABNORMAL HIGH (ref 10.4–31.8)
TIBC: 295 ug/dL (ref 250–450)
UIBC: 180 ug/dL

## 2020-05-22 ENCOUNTER — Other Ambulatory Visit: Payer: Self-pay | Admitting: Internal Medicine

## 2020-05-22 DIAGNOSIS — Z1231 Encounter for screening mammogram for malignant neoplasm of breast: Secondary | ICD-10-CM

## 2020-06-17 ENCOUNTER — Ambulatory Visit
Admission: RE | Admit: 2020-06-17 | Discharge: 2020-06-17 | Disposition: A | Discharge status: Home / Self Care | Payer: Medicare Other | Source: Ambulatory Visit | Attending: Internal Medicine | Admitting: Internal Medicine

## 2020-06-17 DIAGNOSIS — Z1231 Encounter for screening mammogram for malignant neoplasm of breast: Secondary | ICD-10-CM | POA: Diagnosis present

## 2020-06-20 ENCOUNTER — Encounter: Payer: Self-pay | Admitting: Oncology

## 2020-06-20 NOTE — Progress Notes (Signed)
Patient call for pre assessment. She denies any concerns or pain at this time.

## 2020-06-21 NOTE — Progress Notes (Signed)
[place]  [phone] [phone]  ID: Jeryl Columbia OB: 04-13-1946  MR#: [id number]  HAL#:[id number]  Patient Care Team: Idelle Crouch, MD as PCP - General (Internal Medicine) Lloyd Huger, MD as Consulting Physician (Hematology and Oncology)   CHIEF COMPLAINT: JAK-2 positive, polycythemia vera, iron deficiency, heterozygous for hemochromatosis gene mutation H63D   INTERVAL HISTORY: Patient returns to clinic today for repeat office work and further evaluation.  She is tolerating Hydrea without significant side effects, although admits to increased fatigue over the past several weeks.  She otherwise feels well and is asymptomatic.  She has no neurologic complaints.  She has good appetite and denies weight loss.  She denies any fevers, night sweats, or weight loss.  She denies any chest pain, shortness of breath, cough, or hemoptysis.  She denies any nausea, vomiting, constipation, or diarrhea. She has no urinary complaints.  Patient offers no further specific complaints today.  REVIEW OF SYSTEMS:   Review of Systems  Constitutional: Positive for malaise/fatigue. Negative for fever and weight loss.  HENT: Negative.  Negative for congestion and sinus pain.   Respiratory: Negative.  Negative for cough and shortness of breath.   Cardiovascular: Negative.  Negative for chest pain and leg swelling.  Gastrointestinal: Negative.  Negative for abdominal pain, constipation, diarrhea, nausea and vomiting.  Genitourinary: Negative.  Negative for dysuria.  Musculoskeletal: Negative.  Negative for back pain.  Neurological: Negative.  Negative for dizziness, weakness and headaches.  Endo/Heme/Allergies: Does not bruise/bleed easily.  Psychiatric/Behavioral: Negative.  The patient is not nervous/anxious and does not have insomnia.     As per HPI. Otherwise, a complete review of systems is negative.  PAST MEDICAL HISTORY: Past Medical History:    Diagnosis Date   Abnormality of gait 03/19/2016   Hyperthyroidism    Polio    PSVT (paroxysmal supraventricular tachycardia) ([place])     PAST SURGICAL HISTORY: Past Surgical History:  Procedure Laterality Date   BREAST BIOPSY Right 1999   EXCISIONAL - NEG   BREAST BIOPSY Right 2012   EXCISIONAL - NEG   BREAST SURGERY     breast biopsies   COLONOSCOPY WITH PROPOFOL N/A 07/04/2015   Procedure: COLONOSCOPY WITH PROPOFOL;  Surgeon: Manya Silvas, MD;  Location: [place];  Service: Endoscopy;  Laterality: N/A;   ELECTROPHYSIOLOGIC STUDY N/A 12/16/2015   Procedure: SVT Ablation;  Surgeon: Evans Lance, MD;  Location: [place] [facility];  Service: Cardiovascular;  Laterality: N/A;   TONSILLECTOMY      FAMILY HISTORY: Reviewed and unchanged. No reported history of malignancy or chronic disease.     ADVANCED DIRECTIVES:    HEALTH MAINTENANCE: Social History   Tobacco Use   Smoking status: Never Smoker   Smokeless tobacco: Never Used  Substance Use Topics   Alcohol use: No   Drug use: Not on file     Colonoscopy:  PAP:  Bone density:  Lipid panel:  Allergies  Allergen Reactions   Bee Venom Anaphylaxis   Caffeine     Fever, chills, withdrawal symptoms    Current Outpatient Medications  Medication Sig Dispense Refill   ARMOUR THYROID 60 MG tablet Take 60 mg by mouth daily.  4   calcium carbonate (OSCAL) 1500 (600 CA) MG TABS tablet Take 1,200 mg by mouth 2 (two) times a week.      cholecalciferol (VITAMIN D) 1000 units tablet Take 1,000 Units by mouth daily.     Cyanocobalamin (RA VITAMIN B-12 TR)  1000 MCG TBCR Take 1,000 mcg by mouth daily.      DHEA 25 MG CAPS Take 1 capsule by mouth 2 (two) times a week.     Fish Oil-Cholecalciferol (FISH OIL + D3) 1000-1000 MG-UNIT CAPS Take 1,000 mg by mouth daily.      folic acid (FOLVITE) 829 MCG tablet Take 800 mcg by mouth daily.      hydroxyurea (HYDREA) 500 MG capsule TAKE 1 CAPSULE (500 MG  TOTAL) BY MOUTH DAILY. MAY TAKE WITH FOOD TO MINIMIZE GI SIDE EFFECTS. 90 capsule 0   L-Theanine 100 MG CAPS Take 100 mg by mouth at bedtime.     Magnesium 200 MG TABS Take by mouth.     Melatonin 3 MG TABS Take by mouth.     Probiotic Product (PROBIOTIC DAILY PO) Take 1 tablet by mouth daily.     Riboflavin 400 MG CAPS Take by mouth.     tiZANidine (ZANAFLEX) 4 MG tablet Take by mouth once as needed.   11   No current facility-administered medications for this visit.    OBJECTIVE: Vitals:   06/20/20 1349  BP: 119/63  Pulse: 78  Resp: 20  Temp: 98.4 F (36.9 C)  SpO2: 99%     Body mass index is 22.46 kg/m.    ECOG FS:0 - Asymptomatic  General: Well-developed, well-nourished, no acute distress. Eyes: Pink conjunctiva, anicteric sclera. HEENT: Normocephalic, moist mucous membranes. Lungs: No audible wheezing or coughing. Heart: Regular rate and rhythm. Abdomen: Soft, nontender, no obvious distention. Musculoskeletal: No edema, cyanosis, or clubbing. Neuro: Alert, answering all questions appropriately. Cranial nerves grossly intact. Skin: No rashes or petechiae noted. Psych: Normal affect.   LAB RESULTS:  Lab Results  Component Value Date   NA 140 06/24/2020   K 4.2 06/24/2020   CL 105 06/24/2020   CO2 26 06/24/2020   GLUCOSE 114 (H) 06/24/2020   BUN 16 06/24/2020   CREATININE 0.54 06/24/2020   CALCIUM 8.8 (L) 06/24/2020   PROT 6.8 06/24/2020   ALBUMIN 4.1 06/24/2020   AST 22 06/24/2020   ALT 16 06/24/2020   ALKPHOS 102 06/24/2020   BILITOT 1.1 06/24/2020   GFRNONAA >60 06/24/2020   GFRAA >60 06/24/2020    Lab Results  Component Value Date   WBC 10.6 (H) 06/24/2020   NEUTROABS 8.2 (H) 06/24/2020   HGB 11.7 (L) 06/24/2020   HCT 35.7 (L) 06/24/2020   MCV 108.5 (H) 06/24/2020   PLT 254 06/24/2020   Lab Results  Component Value Date   IRON 98 06/24/2020   TIBC 277 06/24/2020   IRONPCTSAT 35 (H) 06/24/2020    Lab Results  Component Value Date    FERRITIN 338 (H) 06/24/2020     STUDIES: MM 3D SCREEN BREAST BILATERAL  Result Date: 06/19/2020 CLINICAL DATA:  Screening. EXAM: DIGITAL SCREENING BILATERAL MAMMOGRAM WITH TOMO AND CAD COMPARISON:  Previous exam(s). ACR Breast Density Category c: The breast tissue is heterogeneously dense, which may obscure small masses. FINDINGS: There are no findings suspicious for malignancy. Images were processed with CAD. IMPRESSION: No mammographic evidence of malignancy. A result letter of this screening mammogram will be mailed directly to the patient. RECOMMENDATION: Screening mammogram in one year. (Code:SM-B-01Y) BI-RADS CATEGORY  1: Negative. Electronically Signed   By: Lovey Newcomer M.D.   On: 06/19/2020 11:39    ASSESSMENT: JAK-2 positive, polycythemia vera, iron deficiency, heterozygous for hemochromatosis gene mutation H63D   PLAN:    1. Polycythemia vera: JAK-2 mutation is positive. She  does not require phlebotomy, but would consider in the future if necessary.  Patient has now discontinued charcoal tabs and is taking an increased dose of Hydrea of 1000 mg on Tuesday, Thursday, and Saturday with 500 mg the remainder of the week.  Hemoglobin has trended down significantly and now she is anemic with a hemoglobin of 11.7.  Will decrease Hydrea to 500 mg daily.  Return to clinic in 6 weeks for laboratory work only and then in 3 months for laboratory work and further evaluation. 2. Leukocytosis: Elevated, but improved.  Previously, BCR/ABL was negative for underlying CML.  Repeat peripheral blood flow cytometry did not reveal any abnormality. Consider bone marrow biopsy in the future, but this is not necessary at this point.  Dose reduced Hydrea as above. 3. Hemochromatosis: Patient is heterozygote, therefore this is likely clinically insignificant.  Her most recent ferritin is 338.  Monitor. 4.  Iron deficiency: Resolved. 5.  Hyperbilirubinemia: Resolved.   Patient expressed understanding and was in  agreement with this plan. She also understands that She can call clinic at any time with any questions, concerns, or complaints.    Lloyd Huger, MD 06/24/20 1:26 PM

## 2020-06-24 ENCOUNTER — Inpatient Hospital Stay: Payer: Medicare Other | Attending: Oncology

## 2020-06-24 ENCOUNTER — Encounter: Payer: Self-pay | Admitting: Oncology

## 2020-06-24 ENCOUNTER — Inpatient Hospital Stay (HOSPITAL_BASED_OUTPATIENT_CLINIC_OR_DEPARTMENT_OTHER): Payer: Medicare Other | Admitting: Oncology

## 2020-06-24 ENCOUNTER — Other Ambulatory Visit: Payer: Self-pay

## 2020-06-24 VITALS — BP 119/63 | HR 78 | Temp 98.4°F | Resp 20 | Ht 67.0 in | Wt 143.4 lb

## 2020-06-24 DIAGNOSIS — D45 Polycythemia vera: Secondary | ICD-10-CM | POA: Diagnosis present

## 2020-06-24 DIAGNOSIS — R5383 Other fatigue: Secondary | ICD-10-CM | POA: Insufficient documentation

## 2020-06-24 DIAGNOSIS — D72829 Elevated white blood cell count, unspecified: Secondary | ICD-10-CM | POA: Diagnosis not present

## 2020-06-24 DIAGNOSIS — D509 Iron deficiency anemia, unspecified: Secondary | ICD-10-CM | POA: Insufficient documentation

## 2020-06-24 LAB — CBC WITH DIFFERENTIAL/PLATELET
Abs Immature Granulocytes: 0.17 10*3/uL — ABNORMAL HIGH (ref 0.00–0.07)
Basophils Absolute: 0.1 10*3/uL (ref 0.0–0.1)
Basophils Relative: 1 %
Eosinophils Absolute: 0.2 10*3/uL (ref 0.0–0.5)
Eosinophils Relative: 2 %
HCT: 35.7 % — ABNORMAL LOW (ref 36.0–46.0)
Hemoglobin: 11.7 g/dL — ABNORMAL LOW (ref 12.0–15.0)
Immature Granulocytes: 2 %
Lymphocytes Relative: 14 %
Lymphs Abs: 1.5 10*3/uL (ref 0.7–4.0)
MCH: 35.6 pg — ABNORMAL HIGH (ref 26.0–34.0)
MCHC: 32.8 g/dL (ref 30.0–36.0)
MCV: 108.5 fL — ABNORMAL HIGH (ref 80.0–100.0)
Monocytes Absolute: 0.4 10*3/uL (ref 0.1–1.0)
Monocytes Relative: 4 %
Neutro Abs: 8.2 10*3/uL — ABNORMAL HIGH (ref 1.7–7.7)
Neutrophils Relative %: 77 %
Platelets: 254 10*3/uL (ref 150–400)
RBC: 3.29 MIL/uL — ABNORMAL LOW (ref 3.87–5.11)
RDW: 18.1 % — ABNORMAL HIGH (ref 11.5–15.5)
Smear Review: NORMAL
WBC: 10.6 10*3/uL — ABNORMAL HIGH (ref 4.0–10.5)
nRBC: 0 % (ref 0.0–0.2)

## 2020-06-24 LAB — IRON AND TIBC
Iron: 98 ug/dL (ref 28–170)
Saturation Ratios: 35 % — ABNORMAL HIGH (ref 10.4–31.8)
TIBC: 277 ug/dL (ref 250–450)
UIBC: 179 ug/dL

## 2020-06-24 LAB — COMPREHENSIVE METABOLIC PANEL
ALT: 16 U/L (ref 0–44)
AST: 22 U/L (ref 15–41)
Albumin: 4.1 g/dL (ref 3.5–5.0)
Alkaline Phosphatase: 102 U/L (ref 38–126)
Anion gap: 9 (ref 5–15)
BUN: 16 mg/dL (ref 8–23)
CO2: 26 mmol/L (ref 22–32)
Calcium: 8.8 mg/dL — ABNORMAL LOW (ref 8.9–10.3)
Chloride: 105 mmol/L (ref 98–111)
Creatinine, Ser: 0.54 mg/dL (ref 0.44–1.00)
GFR calc Af Amer: >60 mL/min (ref >60)
GFR calc non Af Amer: >60 mL/min (ref >60)
Glucose, Bld: 114 mg/dL — ABNORMAL HIGH (ref 70–99)
Potassium: 4.2 mmol/L (ref 3.5–5.1)
Sodium: 140 mmol/L (ref 135–145)
Total Bilirubin: 1.1 mg/dL (ref 0.3–1.2)
Total Protein: 6.8 g/dL (ref 6.5–8.1)

## 2020-06-24 LAB — FERRITIN: Ferritin: 338 ng/mL — ABNORMAL HIGH (ref 11–307)

## 2020-06-25 ENCOUNTER — Other Ambulatory Visit: Payer: Self-pay | Admitting: Oncology

## 2020-08-05 ENCOUNTER — Other Ambulatory Visit: Payer: Self-pay

## 2020-08-05 ENCOUNTER — Inpatient Hospital Stay: Payer: Medicare Other | Attending: Oncology

## 2020-08-05 DIAGNOSIS — D45 Polycythemia vera: Secondary | ICD-10-CM | POA: Diagnosis present

## 2020-08-05 LAB — COMPREHENSIVE METABOLIC PANEL
ALT: 17 U/L (ref 0–44)
AST: 23 U/L (ref 15–41)
Albumin: 4.1 g/dL (ref 3.5–5.0)
Alkaline Phosphatase: 116 U/L (ref 38–126)
Anion gap: 7 (ref 5–15)
BUN: 15 mg/dL (ref 8–23)
CO2: 25 mmol/L (ref 22–32)
Calcium: 8.7 mg/dL — ABNORMAL LOW (ref 8.9–10.3)
Chloride: 107 mmol/L (ref 98–111)
Creatinine, Ser: 0.51 mg/dL (ref 0.44–1.00)
GFR calc Af Amer: >60 mL/min (ref >60)
GFR calc non Af Amer: >60 mL/min (ref >60)
Glucose, Bld: 120 mg/dL — ABNORMAL HIGH (ref 70–99)
Potassium: 3.8 mmol/L (ref 3.5–5.1)
Sodium: 139 mmol/L (ref 135–145)
Total Bilirubin: 0.9 mg/dL (ref 0.3–1.2)
Total Protein: 6.7 g/dL (ref 6.5–8.1)

## 2020-08-05 LAB — IRON AND TIBC
Iron: 56 ug/dL (ref 28–170)
Saturation Ratios: 19 % (ref 10.4–31.8)
TIBC: 300 ug/dL (ref 250–450)
UIBC: 244 ug/dL

## 2020-08-05 LAB — CBC WITH DIFFERENTIAL/PLATELET
Abs Immature Granulocytes: 0.33 10*3/uL — ABNORMAL HIGH (ref 0.00–0.07)
Basophils Absolute: 0.1 10*3/uL (ref 0.0–0.1)
Basophils Relative: 1 %
Eosinophils Absolute: 0.3 10*3/uL (ref 0.0–0.5)
Eosinophils Relative: 2 %
HCT: 37.4 % (ref 36.0–46.0)
Hemoglobin: 12.6 g/dL (ref 12.0–15.0)
Immature Granulocytes: 2 %
Lymphocytes Relative: 11 %
Lymphs Abs: 1.6 10*3/uL (ref 0.7–4.0)
MCH: 35.8 pg — ABNORMAL HIGH (ref 26.0–34.0)
MCHC: 33.7 g/dL (ref 30.0–36.0)
MCV: 106.3 fL — ABNORMAL HIGH (ref 80.0–100.0)
Monocytes Absolute: 0.5 10*3/uL (ref 0.1–1.0)
Monocytes Relative: 3 %
Neutro Abs: 11.8 10*3/uL — ABNORMAL HIGH (ref 1.7–7.7)
Neutrophils Relative %: 81 %
Platelets: 253 10*3/uL (ref 150–400)
RBC: 3.52 MIL/uL — ABNORMAL LOW (ref 3.87–5.11)
RDW: 16.7 % — ABNORMAL HIGH (ref 11.5–15.5)
Smear Review: NORMAL
WBC: 14.6 10*3/uL — ABNORMAL HIGH (ref 4.0–10.5)
nRBC: 0.2 % (ref 0.0–0.2)

## 2020-08-05 LAB — FERRITIN: Ferritin: 263 ng/mL (ref 11–307)

## 2020-09-08 ENCOUNTER — Encounter: Payer: Self-pay | Admitting: Emergency Medicine

## 2020-09-08 ENCOUNTER — Emergency Department: Payer: Medicare Other

## 2020-09-08 ENCOUNTER — Inpatient Hospital Stay
Admission: EM | Admit: 2020-09-08 | Discharge: 2020-09-11 | DRG: 494 | Disposition: A | Discharge status: Home Health | Payer: Medicare Other | Attending: Orthopedic Surgery | Admitting: Orthopedic Surgery

## 2020-09-08 ENCOUNTER — Other Ambulatory Visit: Payer: Self-pay

## 2020-09-08 DIAGNOSIS — W19XXXA Unspecified fall, initial encounter: Secondary | ICD-10-CM | POA: Diagnosis present

## 2020-09-08 DIAGNOSIS — Z888 Allergy status to other drugs, medicaments and biological substances status: Secondary | ICD-10-CM | POA: Diagnosis not present

## 2020-09-08 DIAGNOSIS — S82401A Unspecified fracture of shaft of right fibula, initial encounter for closed fracture: Secondary | ICD-10-CM | POA: Diagnosis present

## 2020-09-08 DIAGNOSIS — Z993 Dependence on wheelchair: Secondary | ICD-10-CM | POA: Diagnosis not present

## 2020-09-08 DIAGNOSIS — Z20822 Contact with and (suspected) exposure to covid-19: Secondary | ICD-10-CM | POA: Diagnosis present

## 2020-09-08 DIAGNOSIS — Z91030 Bee allergy status: Secondary | ICD-10-CM

## 2020-09-08 DIAGNOSIS — G14 Postpolio syndrome: Secondary | ICD-10-CM | POA: Diagnosis present

## 2020-09-08 DIAGNOSIS — E559 Vitamin D deficiency, unspecified: Secondary | ICD-10-CM | POA: Diagnosis present

## 2020-09-08 DIAGNOSIS — Z419 Encounter for procedure for purposes other than remedying health state, unspecified: Secondary | ICD-10-CM

## 2020-09-08 DIAGNOSIS — D45 Polycythemia vera: Secondary | ICD-10-CM | POA: Diagnosis present

## 2020-09-08 DIAGNOSIS — Y92009 Unspecified place in unspecified non-institutional (private) residence as the place of occurrence of the external cause: Secondary | ICD-10-CM

## 2020-09-08 DIAGNOSIS — S82201A Unspecified fracture of shaft of right tibia, initial encounter for closed fracture: Secondary | ICD-10-CM | POA: Diagnosis present

## 2020-09-08 DIAGNOSIS — S82101A Unspecified fracture of upper end of right tibia, initial encounter for closed fracture: Secondary | ICD-10-CM | POA: Diagnosis present

## 2020-09-08 DIAGNOSIS — E039 Hypothyroidism, unspecified: Secondary | ICD-10-CM | POA: Diagnosis present

## 2020-09-08 DIAGNOSIS — E782 Mixed hyperlipidemia: Secondary | ICD-10-CM | POA: Diagnosis present

## 2020-09-08 DIAGNOSIS — Z8781 Personal history of (healed) traumatic fracture: Secondary | ICD-10-CM

## 2020-09-08 LAB — CBC
HCT: 39.7 % (ref 36.0–46.0)
Hemoglobin: 12.9 g/dL (ref 12.0–15.0)
MCH: 33.3 pg (ref 26.0–34.0)
MCHC: 32.5 g/dL (ref 30.0–36.0)
MCV: 102.6 fL — ABNORMAL HIGH (ref 80.0–100.0)
Platelets: 330 10*3/uL (ref 150–400)
RBC: 3.87 MIL/uL (ref 3.87–5.11)
RDW: 17.2 % — ABNORMAL HIGH (ref 11.5–15.5)
WBC: 16.9 10*3/uL — ABNORMAL HIGH (ref 4.0–10.5)
nRBC: 0.1 % (ref 0.0–0.2)

## 2020-09-08 LAB — BASIC METABOLIC PANEL
Anion gap: 10 (ref 5–15)
BUN: 14 mg/dL (ref 8–23)
CO2: 24 mmol/L (ref 22–32)
Calcium: 8.7 mg/dL — ABNORMAL LOW (ref 8.9–10.3)
Chloride: 104 mmol/L (ref 98–111)
Creatinine, Ser: 0.6 mg/dL (ref 0.44–1.00)
GFR calc Af Amer: >60 mL/min (ref >60)
GFR calc non Af Amer: >60 mL/min (ref >60)
Glucose, Bld: 120 mg/dL — ABNORMAL HIGH (ref 70–99)
Potassium: 3.8 mmol/L (ref 3.5–5.1)
Sodium: 138 mmol/L (ref 135–145)

## 2020-09-08 LAB — SURGICAL PCR SCREEN
MRSA, PCR: NEGATIVE
Staphylococcus aureus: NEGATIVE

## 2020-09-08 LAB — SARS CORONAVIRUS 2 BY RT PCR (HOSPITAL ORDER, PERFORMED IN ~~LOC~~ HOSPITAL LAB): SARS Coronavirus 2: NEGATIVE

## 2020-09-08 MED ORDER — HYDROCODONE-ACETAMINOPHEN 5-325 MG PO TABS
1.0000 | ORAL_TABLET | Freq: Four times a day (QID) | ORAL | Status: DC | PRN
  Administered 2020-09-08 – 2020-09-10 (×3): 1 via ORAL
  Filled 2020-09-08 (×3): qty 1

## 2020-09-08 MED ORDER — CEFAZOLIN SODIUM-DEXTROSE 1-4 GM/50ML-% IV SOLN
1.0000 g | Freq: Once | INTRAVENOUS | Status: AC
  Administered 2020-09-09: 2 g via INTRAVENOUS
  Filled 2020-09-08: qty 50

## 2020-09-08 MED ORDER — FENTANYL CITRATE (PF) 100 MCG/2ML IJ SOLN
50.0000 ug | Freq: Once | INTRAMUSCULAR | Status: AC
  Administered 2020-09-08: 50 ug via INTRAVENOUS
  Filled 2020-09-08: qty 2

## 2020-09-08 MED ORDER — MORPHINE SULFATE (PF) 2 MG/ML IV SOLN
0.5000 mg | INTRAVENOUS | Status: DC | PRN
  Administered 2020-09-08 – 2020-09-09 (×3): 0.5 mg via INTRAVENOUS
  Filled 2020-09-08 (×3): qty 1

## 2020-09-08 NOTE — H&P (Signed)
Subjective: Patient is a 74 y.o. female who presents with history of a fall at home resulting in inability to walk and severe right lower leg pain on the right. This occurred today.  EMS was called she is brought to the emergency room where she was found to have a comminuted proximal tibia and fibula fracture.  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  Patient Active Problem List   Diagnosis Date Noted  . Hyperlipemia, mixed 10/27/2016  . Hemochromatosis 07/20/2016  . Polycythemia 04/21/2016  . Vitamin D deficiency, unspecified 04/21/2016  . Abnormality of gait 03/19/2016  . Polycythemia vera ([place]) 02/25/2016  . SVT (supraventricular tachycardia) ([place]) 12/16/2015  . PSVT (paroxysmal supraventricular tachycardia) ([place]) 11/11/2015  . Acquired hypothyroidism 10/06/2015  . 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 Abnormality of gait 03/19/2016  . Hyperthyroidism   . Polio   . PSVT (paroxysmal supraventricular tachycardia) ([place])     Past Surgical History:  Procedure Laterality Date  . BREAST BIOPSY Right 1999   EXCISIONAL - NEG  . BREAST BIOPSY Right 2012   EXCISIONAL - NEG  . BREAST SURGERY     breast biopsies  . COLONOSCOPY WITH PROPOFOL N/A 07/04/2015   Procedure: COLONOSCOPY WITH PROPOFOL;  Surgeon: Manya Silvas, MD;  Location: [facility] ENDOSCOPY;  Service: Endoscopy;  Laterality: N/A;  . ELECTROPHYSIOLOGIC STUDY N/A 12/16/2015   Procedure: SVT Ablation;  Surgeon: Evans Lance, MD;  Location: [place] [facility];  Service: Cardiovascular;  Laterality: N/A;  . TONSILLECTOMY      (Not in a hospital admission)  Allergies  Allergen Reactions  . Bee Venom Anaphylaxis  . Caffeine     Fever, chills, withdrawal symptoms    Social History   Tobacco Use  . Smoking status: Never Smoker  . Smokeless tobacco: Never Used  Substance  Use Topics  . Alcohol use: No    Family History  Problem Relation Age of Onset  . Breast cancer Neg Hx     Review of Systems Pertinent items are noted in HPI.  Objective: Vital signs in last 24 hours: Temp:  [98.2 F (36.8 C)] 98.2 F (36.8 C) (09/20 1344) Pulse Rate:  [80] 80 (09/20 1344) Resp:  [18] 18 (09/20 1344) BP: (130)/(82) 130/82 (09/20 1344) SpO2:  [100 %] 100 % (09/20 1344) Weight:  [64 kg] 64 kg (09/20 1344)  BP 130/82 (BP Location: Right Arm)   Pulse 80   Temp 98.2 F (36.8 C) (Oral)   Resp 18   Ht 5\' 7"  (1.702 m)   Wt 64 kg   SpO2 100%   BMI 22.08 kg/m  General appearance: alert, cooperative, appears stated age and mild distress Lungs: clear to auscultation bilaterally Heart: regular rate and rhythm, S1, S2 normal, no murmur, click, rub or gallop Extremities: She has an AFO on the left leg, right leg is noted [place] swelling with ecchymosis at the proximal medial calf.  Skin is intact.  There is obviously some rotation deformity to the lower extremity.  She is unable to flex extend the toes but this is common for her with her polio. Pulses: 2+ and symmetric  Imaging Review Fractures of the right leg show comminuted spiral proximal tibia and fibula fracture  Assessment/Plan: right tib-fib fracture  Will proceed with operative fixation. Discussed risks, benefits, possible complications of intramedullary rodding.

## 2020-09-08 NOTE — ED Triage Notes (Signed)
Pt via EMS from home. Pt had a mechanical fall approx an hour before arrival. Pt has a hx of polio and usually uses a walker and wheelchair for mobility. On assessment, outward rotation noted to the R foot and ankle, swelling and bruising noted to r shin. EMS gave 50 fentanyl and 4 of Zofran. Denies any other complaints. Pt A&Ox4 and NAD.

## 2020-09-08 NOTE — ED Provider Notes (Signed)
[facility] Emergency Department Provider Note  Time seen: 2:50 PM  I have reviewed the triage vital signs and the nursing notes.   HISTORY  Chief Complaint Fall  HPI Priscilla Berry is a 74 y.o. female with a past medical history of hypothyroidism, polio, presents to the emergency department after a fall with right leg pain.  According to the patient she falls very frequently due to her polio.  Patient wears lower extremity braces.  Patient states this morning around 11 AM she fell and landed on her right leg.  States immediate pain to the area as well as deformity.  Patient did not hit her head, denies LOC.  No other injuries per patient.  Does not take blood thinners.  Past Medical History:  Diagnosis Date  . Abnormality of gait 03/19/2016  . Hyperthyroidism   . Polio   . PSVT (paroxysmal supraventricular tachycardia) [facility])     Patient Active Problem List   Diagnosis Date Noted  . Hyperlipemia, mixed 10/27/2016  . Hemochromatosis 07/20/2016  . Polycythemia 04/21/2016  . Vitamin D deficiency, unspecified 04/21/2016  . Abnormality of gait 03/19/2016  . Polycythemia vera ([place]) 02/25/2016  . SVT (supraventricular tachycardia) ([place]) 12/16/2015  . PSVT (paroxysmal supraventricular tachycardia) ([place]) 11/11/2015  . Acquired hypothyroidism 10/06/2015  . 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 BREAST BIOPSY Right 1999   EXCISIONAL - NEG  . BREAST BIOPSY Right 2012   EXCISIONAL - NEG  . BREAST SURGERY     breast biopsies  . COLONOSCOPY WITH PROPOFOL N/A 07/04/2015   Procedure: COLONOSCOPY WITH PROPOFOL;  Surgeon: Manya Silvas, MD;  Location: [facility] ENDOSCOPY;  Service: Endoscopy;  Laterality: N/A;  . ELECTROPHYSIOLOGIC STUDY N/A 12/16/2015   Procedure: SVT Ablation;  Surgeon: Evans Lance, MD;  Location: [place] [facility];  Service: Cardiovascular;  Laterality: N/A;  . TONSILLECTOMY      Prior to  Admission medications   Medication Sig Start Date End Date Taking? Authorizing Provider  ARMOUR THYROID 60 MG tablet Take 60 mg by mouth daily. 11/20/15   [provider]  calcium carbonate (OSCAL) 1500 (600 CA) MG TABS tablet Take 1,200 mg by mouth 2 (two) times a week.     [provider]  cholecalciferol (VITAMIN D) 1000 units tablet Take 1,000 Units by mouth daily.    [provider]  Cyanocobalamin (RA VITAMIN B-12 TR) 1000 MCG TBCR Take 1,000 mcg by mouth daily.     [provider]  DHEA 25 MG CAPS Take 1 capsule by mouth 2 (two) times a week.    [provider]  Fish Oil-Cholecalciferol (FISH OIL + D3) 1000-1000 MG-UNIT CAPS Take 1,000 mg by mouth daily.     [provider]  folic acid (FOLVITE) 628 MCG tablet Take 800 mcg by mouth daily.     [provider]  hydroxyurea (HYDREA) 500 MG capsule TAKE 1 CAPSULE (500 MG TOTAL) BY MOUTH DAILY. MAY TAKE WITH FOOD TO MINIMIZE GI SIDE EFFECTS. 06/25/20   Lloyd Huger, MD  L-Theanine 100 MG CAPS Take 100 mg by mouth at bedtime.    [provider]  Magnesium 200 MG TABS Take by mouth.    [provider]  Melatonin 3 MG TABS Take by mouth.    [provider]  Probiotic Product (PROBIOTIC DAILY PO) Take 1 tablet by mouth daily.    [provider]  Riboflavin 400 MG  CAPS Take by mouth.    [provider]  tiZANidine (ZANAFLEX) 4 MG tablet Take by mouth once as needed.  02/02/16   [provider]    Allergies  Allergen Reactions  . Bee Venom Anaphylaxis  . Caffeine     Fever, chills, withdrawal symptoms    Family History  Problem Relation Age of Onset  . Breast cancer Neg Hx     Social History Social History   Tobacco Use  . Smoking status: Never Smoker  . Smokeless tobacco: Never Used  Substance Use Topics  . Alcohol use: No  . Drug use: Not on file    Review of Systems Constitutional: Negative for  fever. Cardiovascular: Negative for chest pain. Respiratory: Negative for shortness of breath. Gastrointestinal: Negative for abdominal pain, vomiting  Musculoskeletal: Right leg pain Neurological: Negative for headache All other ROS negative  ____________________________________________   PHYSICAL EXAM:  VITAL SIGNS: ED Triage Vitals [09/08/20 1344]  Enc Vitals Group     BP 130/82     Pulse Rate 80     Resp 18     Temp 98.2 F (36.8 C)     Temp Source Oral     SpO2 100 %     Weight 141 lb (64 kg)     Height 5\' 7"  (1.702 m)     Head Circumference      Peak Flow      Pain Score 6     Pain Loc      Pain Edu?      Excl. in [place]?    Constitutional: Alert and oriented. Well appearing and in no distress. Eyes: Normal exam ENT      Head: Normocephalic and atraumatic.      Mouth/Throat: Mucous membranes are moist. Cardiovascular: Normal rate, regular rhythm. Respiratory: Normal respiratory effort without tachypnea nor retractions. Breath sounds are clear  Gastrointestinal: Soft and nontender. No distention. Musculoskeletal: Patient's right lower extremity is externally rotated below the knee.  Neuro vastly intact distally.  Mild to moderate ecchymosis of the mid tibia Neurologic:  Normal speech and language. No gross focal neurologic deficits  Skin:  Skin is warm, dry and intact.  Psychiatric: Mood and affect are normal.   ____________________________________________    EKG  EKG viewed and interpreted by myself shows a normal sinus rhythm at 68 bpm, slightly widened QRS, normal axis, normal intervals, no concerning ST changes.  ____________________________________________    RADIOLOGY  Chest x-ray is negative. Right tib-fib x-ray shows a medially displaced comminuted proximal to mid tibial shaft fracture.  Comminuted proximal fibular shaft fracture.  ____________________________________________   INITIAL IMPRESSION / ASSESSMENT AND PLAN / ED COURSE  Pertinent  labs & imaging results that were available during my care of the patient were reviewed by me and considered in my medical decision making (see chart for details).   Patient presents emergency department after mechanical fall onto her right leg.  Obvious deformity on exam but neurovascular intact distally.  X-ray confirms tibia-fibula fracture.  We will discuss with orthopedics for further disposition.  Patient does state 6/10 aching type pain even after 100 mcg of fentanyl by EMS.  Patient given additional pain medicine in the emergency department.  Discussed the patient with Dr. Rudene Christians of orthopedic surgery will be admitting to his service for anticipated operation.  Priscilla Berry was evaluated in Emergency Department on 09/08/2020 for the symptoms described in the history of present illness. She was evaluated in the context of the  global COVID-19 pandemic, which necessitated consideration that the patient might be at risk for infection with the SARS-CoV-2 virus that causes COVID-19. Institutional protocols and algorithms that pertain to the evaluation of patients at risk for COVID-19 are in a state of rapid change based on information released by regulatory bodies including the CDC and federal and state organizations. These policies and algorithms were followed during the patient's care in the ED.  ____________________________________________   FINAL CLINICAL IMPRESSION(S) / ED DIAGNOSES  tibia/fibula fracture   Harvest Dark, MD 09/08/20 1525

## 2020-09-09 ENCOUNTER — Inpatient Hospital Stay: Payer: Medicare Other | Admitting: Anesthesiology

## 2020-09-09 ENCOUNTER — Inpatient Hospital Stay: Payer: Medicare Other

## 2020-09-09 ENCOUNTER — Encounter: Payer: Self-pay | Admitting: Orthopedic Surgery

## 2020-09-09 ENCOUNTER — Encounter
Admission: EM | Disposition: A | Discharge status: Home Health | Payer: Self-pay | Source: Home / Self Care | Attending: Orthopedic Surgery

## 2020-09-09 HISTORY — PX: TIBIA IM NAIL INSERTION: SHX2516

## 2020-09-09 LAB — CREATININE, SERUM
Creatinine, Ser: 0.57 mg/dL (ref 0.44–1.00)
GFR calc Af Amer: >60 mL/min (ref >60)
GFR calc non Af Amer: >60 mL/min (ref >60)

## 2020-09-09 LAB — CBC
HCT: 37.3 % (ref 36.0–46.0)
Hemoglobin: 11.9 g/dL — ABNORMAL LOW (ref 12.0–15.0)
MCH: 33.5 pg (ref 26.0–34.0)
MCHC: 31.9 g/dL (ref 30.0–36.0)
MCV: 105.1 fL — ABNORMAL HIGH (ref 80.0–100.0)
Platelets: 297 10*3/uL (ref 150–400)
RBC: 3.55 MIL/uL — ABNORMAL LOW (ref 3.87–5.11)
RDW: 17.9 % — ABNORMAL HIGH (ref 11.5–15.5)
WBC: 17.1 10*3/uL — ABNORMAL HIGH (ref 4.0–10.5)
nRBC: 0.1 % (ref 0.0–0.2)

## 2020-09-09 SURGERY — INSERTION, INTRAMEDULLARY ROD, TIBIA
Anesthesia: General | Laterality: Right

## 2020-09-09 MED ORDER — ACETAMINOPHEN 325 MG PO TABS: 325.0000 mg | ORAL_TABLET | Freq: Four times a day (QID) | ORAL | Status: DC | PRN

## 2020-09-09 MED ORDER — DIPHENHYDRAMINE HCL 12.5 MG/5ML PO ELIX: 12.5000 mg | ORAL_SOLUTION | ORAL | Status: DC | PRN

## 2020-09-09 MED ORDER — SODIUM CHLORIDE 0.9 % IV SOLN: INTRAVENOUS | Status: DC

## 2020-09-09 MED ORDER — DOCUSATE SODIUM 100 MG PO CAPS
100.0000 mg | ORAL_CAPSULE | Freq: Two times a day (BID) | ORAL | Status: DC
  Administered 2020-09-09 – 2020-09-11 (×4): 100 mg via ORAL
  Filled 2020-09-09 (×4): qty 1

## 2020-09-09 MED ORDER — MAGNESIUM CITRATE PO SOLN
1.0000 | Freq: Once | ORAL | Status: DC | PRN
  Filled 2020-09-09: qty 296

## 2020-09-09 MED ORDER — METOCLOPRAMIDE HCL 5 MG/ML IJ SOLN: 5.0000 mg | Freq: Three times a day (TID) | INTRAMUSCULAR | Status: DC | PRN

## 2020-09-09 MED ORDER — CEFAZOLIN SODIUM-DEXTROSE 1-4 GM/50ML-% IV SOLN
1.0000 g | Freq: Four times a day (QID) | INTRAVENOUS | Status: AC
  Administered 2020-09-09 – 2020-09-10 (×3): 1 g via INTRAVENOUS
  Filled 2020-09-09 (×3): qty 50

## 2020-09-09 MED ORDER — MIDAZOLAM HCL 2 MG/2ML IJ SOLN
INTRAMUSCULAR | Status: AC
  Filled 2020-09-09: qty 2

## 2020-09-09 MED ORDER — OMEGA-3-ACID ETHYL ESTERS 1 G PO CAPS
1.0000 g | ORAL_CAPSULE | Freq: Every day | ORAL | Status: DC
  Administered 2020-09-11: 1 g via ORAL
  Filled 2020-09-09: qty 1

## 2020-09-09 MED ORDER — ONDANSETRON HCL 4 MG/2ML IJ SOLN
INTRAMUSCULAR | Status: DC | PRN
  Administered 2020-09-09: 4 mg via INTRAVENOUS

## 2020-09-09 MED ORDER — LIDOCAINE HCL (PF) 2 % IJ SOLN
INTRAMUSCULAR | Status: AC
  Filled 2020-09-09: qty 5

## 2020-09-09 MED ORDER — L-THEANINE 100 MG PO CAPS: 100.0000 mg | ORAL_CAPSULE | Freq: Every day | ORAL | Status: DC

## 2020-09-09 MED ORDER — FENTANYL CITRATE (PF) 100 MCG/2ML IJ SOLN
25.0000 ug | INTRAMUSCULAR | Status: DC | PRN
  Administered 2020-09-09: 50 ug via INTRAVENOUS

## 2020-09-09 MED ORDER — ZOLPIDEM TARTRATE 5 MG PO TABS: 5.0000 mg | ORAL_TABLET | Freq: Every evening | ORAL | Status: DC | PRN

## 2020-09-09 MED ORDER — THYROID 60 MG PO TABS
60.0000 mg | ORAL_TABLET | Freq: Every day | ORAL | Status: DC
  Administered 2020-09-09 – 2020-09-11 (×3): 60 mg via ORAL
  Filled 2020-09-09 (×4): qty 1

## 2020-09-09 MED ORDER — MAGNESIUM OXIDE 400 (241.3 MG) MG PO TABS
200.0000 mg | ORAL_TABLET | Freq: Every day | ORAL | Status: DC
  Administered 2020-09-11: 200 mg via ORAL
  Filled 2020-09-09: qty 1

## 2020-09-09 MED ORDER — PROPOFOL 500 MG/50ML IV EMUL
INTRAVENOUS | Status: AC
  Filled 2020-09-09: qty 50

## 2020-09-09 MED ORDER — ENOXAPARIN SODIUM 40 MG/0.4ML ~~LOC~~ SOLN
40.0000 mg | SUBCUTANEOUS | Status: DC
  Administered 2020-09-10 – 2020-09-11 (×2): 40 mg via SUBCUTANEOUS
  Filled 2020-09-09 (×2): qty 0.4

## 2020-09-09 MED ORDER — LACTATED RINGERS IV SOLN: INTRAVENOUS | Status: DC | PRN

## 2020-09-09 MED ORDER — ONDANSETRON HCL 4 MG/2ML IJ SOLN: 4.0000 mg | Freq: Four times a day (QID) | INTRAMUSCULAR | Status: DC | PRN

## 2020-09-09 MED ORDER — BISACODYL 10 MG RE SUPP: 10.0000 mg | Freq: Every day | RECTAL | Status: DC | PRN

## 2020-09-09 MED ORDER — FISH OIL + D3 1000-1000 MG-UNIT PO CAPS: 1000.0000 mg | ORAL_CAPSULE | Freq: Every day | ORAL | Status: DC

## 2020-09-09 MED ORDER — METHOCARBAMOL 1000 MG/10ML IJ SOLN
500.0000 mg | Freq: Four times a day (QID) | INTRAVENOUS | Status: DC | PRN
  Filled 2020-09-09: qty 5

## 2020-09-09 MED ORDER — RISAQUAD PO CAPS
1.0000 | ORAL_CAPSULE | Freq: Every day | ORAL | Status: DC
  Administered 2020-09-09 – 2020-09-11 (×2): 1 via ORAL
  Filled 2020-09-09 (×3): qty 1

## 2020-09-09 MED ORDER — OXYCODONE HCL 5 MG PO TABS
10.0000 mg | ORAL_TABLET | ORAL | Status: DC | PRN
  Administered 2020-09-09: 10 mg via ORAL
  Filled 2020-09-09: qty 2

## 2020-09-09 MED ORDER — FENTANYL CITRATE (PF) 100 MCG/2ML IJ SOLN
INTRAMUSCULAR | Status: DC | PRN
Start: 2020-09-09 — End: 2020-09-09
  Administered 2020-09-09 (×2): 25 ug via INTRAVENOUS
  Administered 2020-09-09: 50 ug via INTRAVENOUS

## 2020-09-09 MED ORDER — PROPOFOL 10 MG/ML IV BOLUS
INTRAVENOUS | Status: AC
  Filled 2020-09-09: qty 20

## 2020-09-09 MED ORDER — ACETAMINOPHEN 10 MG/ML IV SOLN
INTRAVENOUS | Status: DC | PRN
  Administered 2020-09-09: 1000 mg via INTRAVENOUS

## 2020-09-09 MED ORDER — LIDOCAINE HCL (CARDIAC) PF 100 MG/5ML IV SOSY
PREFILLED_SYRINGE | INTRAVENOUS | Status: DC | PRN
  Administered 2020-09-09: 60 mg via INTRAVENOUS

## 2020-09-09 MED ORDER — ONDANSETRON HCL 4 MG PO TABS: 4.0000 mg | ORAL_TABLET | Freq: Four times a day (QID) | ORAL | Status: DC | PRN

## 2020-09-09 MED ORDER — HYDROMORPHONE HCL 1 MG/ML IJ SOLN
INTRAMUSCULAR | Status: AC
  Administered 2020-09-09: 0.5 mg via INTRAVENOUS
  Filled 2020-09-09: qty 1

## 2020-09-09 MED ORDER — OXYCODONE HCL 5 MG PO TABS
5.0000 mg | ORAL_TABLET | ORAL | Status: DC | PRN
  Administered 2020-09-11: 5 mg via ORAL
  Filled 2020-09-09: qty 1

## 2020-09-09 MED ORDER — PROPOFOL 10 MG/ML IV BOLUS
INTRAVENOUS | Status: DC | PRN
  Administered 2020-09-09: 150 mg via INTRAVENOUS

## 2020-09-09 MED ORDER — ACETAMINOPHEN 10 MG/ML IV SOLN
INTRAVENOUS | Status: AC
  Filled 2020-09-09: qty 100

## 2020-09-09 MED ORDER — OXYCODONE HCL 5 MG PO TABS: 5.0000 mg | ORAL_TABLET | Freq: Once | ORAL | Status: DC | PRN

## 2020-09-09 MED ORDER — HYDROMORPHONE HCL 1 MG/ML IJ SOLN
0.5000 mg | INTRAMUSCULAR | Status: AC | PRN
  Administered 2020-09-09 (×2): 0.5 mg via INTRAVENOUS

## 2020-09-09 MED ORDER — OXYCODONE HCL 5 MG/5ML PO SOLN: 5.0000 mg | Freq: Once | ORAL | Status: DC | PRN

## 2020-09-09 MED ORDER — FENTANYL CITRATE (PF) 100 MCG/2ML IJ SOLN
INTRAMUSCULAR | Status: AC
  Filled 2020-09-09: qty 2

## 2020-09-09 MED ORDER — VITAMIN D 25 MCG (1000 UNIT) PO TABS
1000.0000 [IU] | ORAL_TABLET | Freq: Every day | ORAL | Status: DC
  Administered 2020-09-09 – 2020-09-11 (×2): 1000 [IU] via ORAL
  Filled 2020-09-09 (×2): qty 1

## 2020-09-09 MED ORDER — FENTANYL CITRATE (PF) 100 MCG/2ML IJ SOLN
INTRAMUSCULAR | Status: AC
  Administered 2020-09-09: 50 ug via INTRAVENOUS
  Filled 2020-09-09: qty 2

## 2020-09-09 MED ORDER — PANTOPRAZOLE SODIUM 40 MG PO TBEC
40.0000 mg | DELAYED_RELEASE_TABLET | Freq: Every day | ORAL | Status: DC
  Administered 2020-09-10 – 2020-09-11 (×2): 40 mg via ORAL
  Filled 2020-09-09 (×2): qty 1

## 2020-09-09 MED ORDER — MAGNESIUM HYDROXIDE 400 MG/5ML PO SUSP: 30.0000 mL | Freq: Every day | ORAL | Status: DC | PRN

## 2020-09-09 MED ORDER — MAGNESIUM 200 MG PO TABS
200.0000 mg | ORAL_TABLET | Freq: Every morning | ORAL | Status: DC
  Filled 2020-09-09: qty 1

## 2020-09-09 MED ORDER — RIBOFLAVIN 400 MG PO CAPS: 400.0000 mg | ORAL_CAPSULE | Freq: Every day | ORAL | Status: DC

## 2020-09-09 MED ORDER — METOCLOPRAMIDE HCL 10 MG PO TABS: 5.0000 mg | ORAL_TABLET | Freq: Three times a day (TID) | ORAL | Status: DC | PRN

## 2020-09-09 MED ORDER — VITAMIN B-12 1000 MCG PO TABS
1000.0000 ug | ORAL_TABLET | Freq: Every day | ORAL | Status: DC
  Administered 2020-09-11: 1000 ug via ORAL
  Filled 2020-09-09 (×4): qty 1

## 2020-09-09 MED ORDER — HYDROXYUREA 500 MG PO CAPS
500.0000 mg | ORAL_CAPSULE | Freq: Every day | ORAL | Status: DC
  Administered 2020-09-09 – 2020-09-11 (×3): 500 mg via ORAL
  Filled 2020-09-09 (×3): qty 1

## 2020-09-09 MED ORDER — TRAMADOL HCL 50 MG PO TABS
50.0000 mg | ORAL_TABLET | Freq: Four times a day (QID) | ORAL | Status: DC
  Administered 2020-09-09 – 2020-09-11 (×8): 50 mg via ORAL
  Filled 2020-09-09 (×8): qty 1

## 2020-09-09 MED ORDER — METHOCARBAMOL 500 MG PO TABS
500.0000 mg | ORAL_TABLET | Freq: Four times a day (QID) | ORAL | Status: DC | PRN
  Administered 2020-09-09: 500 mg via ORAL
  Filled 2020-09-09: qty 1

## 2020-09-09 SURGICAL SUPPLY — 42 items
BIT DRILL CAL 3.2 LONG (BIT) ×2 IMPLANT
BIT DRILL SHORT 3.2MM (DRILL) ×1 IMPLANT
BLADE CLIPPER SURG (BLADE) ×2 IMPLANT
BNDG ELASTIC 6X5.8 VLCR STR LF (GAUZE/BANDAGES/DRESSINGS) ×2 IMPLANT
CANISTER SUCT 1200ML W/VALVE (MISCELLANEOUS) ×2 IMPLANT
CHLORAPREP W/TINT 26 (MISCELLANEOUS) ×2 IMPLANT
COVER WAND RF STERILE (DRAPES) ×2 IMPLANT
CUFF TOURN SGL QUICK 24 (TOURNIQUET CUFF)
CUFF TOURN SGL QUICK 30 (TOURNIQUET CUFF)
CUFF TRNQT CYL 24X4X16.5-23 (TOURNIQUET CUFF) IMPLANT
CUFF TRNQT CYL 30X4X21-28X (TOURNIQUET CUFF) IMPLANT
DRAPE C-ARM XRAY 36X54 (DRAPES) ×2 IMPLANT
DRAPE C-ARMOR (DRAPES) ×2 IMPLANT
DRILL SHORT 3.2MM (DRILL) ×2
ELECT CAUTERY BLADE 6.4 (BLADE) ×2 IMPLANT
ELECT REM PT RETURN 9FT ADLT (ELECTROSURGICAL) ×2
ELECTRODE REM PT RTRN 9FT ADLT (ELECTROSURGICAL) ×1 IMPLANT
GAUZE SPONGE 4X4 12PLY STRL (GAUZE/BANDAGES/DRESSINGS) ×4 IMPLANT
GAUZE XEROFORM 1X8 LF (GAUZE/BANDAGES/DRESSINGS) ×2 IMPLANT
GLOVE BIOGEL PI IND STRL 9 (GLOVE) ×1 IMPLANT
GLOVE BIOGEL PI INDICATOR 9 (GLOVE) ×1
GLOVE SURG SYN 9.0  PF PI (GLOVE) ×1
GLOVE SURG SYN 9.0 PF PI (GLOVE) ×1 IMPLANT
GOWN SRG 2XL LVL 4 RGLN SLV (GOWNS) ×1 IMPLANT
GOWN STRL NON-REIN 2XL LVL4 (GOWNS) ×1
GOWN STRL REUS W/ TWL LRG LVL3 (GOWN DISPOSABLE) ×2 IMPLANT
GOWN STRL REUS W/TWL LRG LVL3 (GOWN DISPOSABLE) ×2
KIT TURNOVER KIT A (KITS) ×2 IMPLANT
NAIL TIBIAL EX 9X330MM (Nail) ×2 IMPLANT
NS IRRIG 1000ML POUR BTL (IV SOLUTION) ×2 IMPLANT
PACK TOTAL KNEE (MISCELLANEOUS) ×2 IMPLANT
PAD ABD DERMACEA PRESS 5X9 (GAUZE/BANDAGES/DRESSINGS) ×4 IMPLANT
REAMER ROD DEEP FLUTE 2.5X950 (INSTRUMENTS) ×2 IMPLANT
SCALPEL PROTECTED #15 DISP (BLADE) ×4 IMPLANT
SCREW LOCK 4X30 TI (Screw) ×2 IMPLANT
SCREW LOCK TI 4X42 (Screw) ×2 IMPLANT
SCREW LOCKING 4.0 40MM (Screw) ×2 IMPLANT
SCREW LOCKING 4X38 (Screw) ×2 IMPLANT
STAPLER SKIN PROX 35W (STAPLE) ×2 IMPLANT
SUT VIC AB 0 CT1 36 (SUTURE) ×2 IMPLANT
SUT VIC AB 2-0 CT1 (SUTURE) ×2 IMPLANT
SYR BULB IRRIG 60ML STRL (SYRINGE) ×2 IMPLANT

## 2020-09-09 NOTE — Anesthesia Preprocedure Evaluation (Addendum)
Anesthesia Evaluation  Patient identified by MRN, date of birth, ID band Patient awake    Reviewed: Allergy & Precautions, H&P , NPO status , Patient's Chart, lab work & pertinent test results  History of Anesthesia Complications Negative for: history of anesthetic complications  Airway Mallampati: III  TM Distance: <3 FB Neck ROM: limited    Dental  (+) Chipped   Pulmonary neg pulmonary ROS, neg shortness of breath,    Pulmonary exam normal        Cardiovascular Exercise Tolerance: Good negative cardio ROS Normal cardiovascular exam     Neuro/Psych negative neurological ROS  negative psych ROS   GI/Hepatic negative GI ROS, Neg liver ROS, neg GERD  ,  Endo/Other  Hypothyroidism Hyperthyroidism   Renal/GU      Musculoskeletal   Abdominal   Peds  Hematology negative hematology ROS (+)   Anesthesia Other Findings Past Medical History: 03/19/2016: Abnormality of gait No date: Hyperthyroidism No date: Polio No date: PSVT (paroxysmal supraventricular tachycardia) ([place])  Past Surgical History: 1999: BREAST BIOPSY; Right     Comment:  EXCISIONAL - NEG 2012: BREAST BIOPSY; Right     Comment:  EXCISIONAL - NEG No date: BREAST SURGERY     Comment:  breast biopsies 07/04/2015: COLONOSCOPY WITH PROPOFOL; N/A     Comment:  Procedure: COLONOSCOPY WITH PROPOFOL;  Surgeon: Manya Silvas, MD;  Location: [facility] ENDOSCOPY;  Service:               Endoscopy;  Laterality: N/A; 12/16/2015: ELECTROPHYSIOLOGIC STUDY; N/A     Comment:  Procedure: SVT Ablation;  Surgeon: Evans Lance, MD;                Location: [facility];  Service: Cardiovascular;                Laterality: N/A; No date: TONSILLECTOMY  BMI    Body Mass Index: 22.08 kg/m      Reproductive/Obstetrics negative OB ROS                             Anesthesia Physical Anesthesia Plan  ASA: III  Anesthesia  Plan: General LMA   Post-op Pain Management:    Induction: Intravenous  PONV Risk Score and Plan: Ondansetron, Dexamethasone, Midazolam and Treatment may vary due to age or medical condition  Airway Management Planned: Oral ETT  Additional Equipment:   Intra-op Plan:   Post-operative Plan: Extubation in OR  Informed Consent: I have reviewed the patients History and Physical, chart, labs and discussed the procedure including the risks, benefits and alternatives for the proposed anesthesia with the patient or authorized representative who has indicated his/her understanding and acceptance.     Dental Advisory Given  Plan Discussed with: Anesthesiologist, CRNA and Surgeon  Anesthesia Plan Comments: (Patient consented for risks of anesthesia including but not limited to:  - adverse reactions to medications - damage to eyes, teeth, lips or other oral mucosa - nerve damage due to positioning  - sore throat or hoarseness - Damage to heart, brain, nerves, lungs, other parts of body or loss of life  Patient voiced understanding.)       Anesthesia Quick Evaluation

## 2020-09-09 NOTE — Op Note (Signed)
09/08/2020 - 09/09/2020  12:28 PM  PATIENT:  Jeryl Columbia  74 y.o. female  PRE-OPERATIVE DIAGNOSIS:  Right Tibia Fracture  POST-OPERATIVE DIAGNOSIS:  Right Tibia Fracture  PROCEDURE:  Procedure(s): INTRAMEDULLARY (IM) NAIL TIBIAL (Right)  SURGEON: Laurene Footman, MD  ASSISTANTS: None  ANESTHESIA:   general  EBL:  Total I/O In: 200 [IV Piggyback:200] Out: -   BLOOD ADMINISTERED:none  DRAINS: none   LOCAL MEDICATIONS USED:  NONE  SPECIMEN:  No Specimen  DISPOSITION OF SPECIMEN:  N/A  COUNTS:  YES  TOURNIQUET:   Total Tourniquet Time Documented: Thigh (Right) - 30 minutes Total: Thigh (Right) - 30 minutes   IMPLANTS: Synthes tibial nail EX 9 x 330 with 4 interlocking screws  DICTATION: .Dragon Dictation patient was brought to the operating room and after adequate general anesthesia was obtained the right leg was prepped and draped in usual sterile fashion.  After patient identification and timeout procedures were completed the C arm was brought in and visualization of the tibial tubercle and tibial spines identified.  Anterior incision was made with the patellar tendon splitting approach followed by use of an awl to enter the canal and placement of the guidewire.  Reaming was carried out to 10.5 mm at which point there was chatter at the isthmus distal to the fracture length of been determined prior to this based on the ruler with the guidewire in place.  9 x 330 rod was then inserted with slight extension of the proximal fragment but acceptable alignment in both AP and lateral projections.  Using the proximal guide 2 proximal interlocking screws were placed in a static fashion.  Drilling measuring and placing the cortical screws.  Going distally with perfect circles identified using the C arm to small incision was made drilling measuring and to cortical screws placed 4.0 mm in diameter.  Scanning the leg in both AP and lateral views acceptable alignment was obtained with  proximal and distal fixation.  The wounds were then irrigated and closed with a running 0 Vicryl for the patellar tendon 2-0 Vicryl subcutaneously skin staples followed by Xeroform 4 x 4's ABD web roll and Ace wrap  PLAN OF CARE: Continue as inpatient  PATIENT DISPOSITION:  PACU - hemodynamically stable.

## 2020-09-09 NOTE — Anesthesia Procedure Notes (Signed)
Procedure Name: LMA Insertion Date/Time: 09/09/2020 11:23 AM Performed by: Aline Brochure, CRNA Pre-anesthesia Checklist: Patient identified, Emergency Drugs available, Suction available and Patient being monitored Patient Re-evaluated:Patient Re-evaluated prior to induction Oxygen Delivery Method: Circle system utilized Preoxygenation: Pre-oxygenation with 100% oxygen Induction Type: IV induction Ventilation: Mask ventilation without difficulty LMA: LMA inserted LMA Size: 3.0 Number of attempts: 1 Placement Confirmation: positive ETCO2 and breath sounds checked- equal and bilateral Tube secured with: Tape Dental Injury: Teeth and Oropharynx as per pre-operative assessment

## 2020-09-09 NOTE — Progress Notes (Signed)
Patient reports pain has finally gotten under control, plan on surgery later today.

## 2020-09-09 NOTE — Anesthesia Postprocedure Evaluation (Signed)
Anesthesia Post Note  Patient: Priscilla Berry  Procedure(s) Performed: INTRAMEDULLARY (IM) NAIL TIBIAL (Right )  Patient location during evaluation: PACU Anesthesia Type: General Level of consciousness: awake and alert Pain management: pain level controlled Vital Signs Assessment: post-procedure vital signs reviewed and stable Respiratory status: spontaneous breathing, nonlabored ventilation, respiratory function stable and patient connected to nasal cannula oxygen Cardiovascular status: blood pressure returned to baseline and stable Postop Assessment: no apparent nausea or vomiting Anesthetic complications: no   No complications documented.   Last Vitals:  Vitals:   09/09/20 1400 09/09/20 1432  BP: 133/66 121/66  Pulse: 72 76  Resp: 17 14  Temp: 37.4 C 36.9 C  SpO2: 96% 98%    Last Pain:  Vitals:   09/09/20 1432  TempSrc: Oral  PainSc:                  Priscilla Berry

## 2020-09-09 NOTE — Transfer of Care (Signed)
Immediate Anesthesia Transfer of Care Note  Patient: Priscilla Berry  Procedure(s) Performed: INTRAMEDULLARY (IM) NAIL TIBIAL (Right )  Patient Location: PACU  Anesthesia Type:General  Level of Consciousness: sedated  Airway & Oxygen Therapy: Patient connected to face mask oxygen  Post-op Assessment: Post -op Vital signs reviewed and stable  Post vital signs: stable  Last Vitals:  Vitals Value Taken Time  BP 109/55   Temp 98.6   Pulse 87 09/09/20 1233  Resp 28 09/09/20 1233  SpO2 94 % 09/09/20 1233  Vitals shown include unvalidated device data.  Last Pain:  Vitals:   09/09/20 1029  TempSrc: Temporal  PainSc: 5          Complications: No complications documented.

## 2020-09-10 LAB — BASIC METABOLIC PANEL
Anion gap: 10 (ref 5–15)
BUN: 10 mg/dL (ref 8–23)
CO2: 25 mmol/L (ref 22–32)
Calcium: 8.2 mg/dL — ABNORMAL LOW (ref 8.9–10.3)
Chloride: 101 mmol/L (ref 98–111)
Creatinine, Ser: 0.52 mg/dL (ref 0.44–1.00)
GFR calc Af Amer: >60 mL/min (ref >60)
GFR calc non Af Amer: >60 mL/min (ref >60)
Glucose, Bld: 105 mg/dL — ABNORMAL HIGH (ref 70–99)
Potassium: 4.1 mmol/L (ref 3.5–5.1)
Sodium: 136 mmol/L (ref 135–145)

## 2020-09-10 LAB — CBC
HCT: 35.3 % — ABNORMAL LOW (ref 36.0–46.0)
Hemoglobin: 11.6 g/dL — ABNORMAL LOW (ref 12.0–15.0)
MCH: 34.1 pg — ABNORMAL HIGH (ref 26.0–34.0)
MCHC: 32.9 g/dL (ref 30.0–36.0)
MCV: 103.8 fL — ABNORMAL HIGH (ref 80.0–100.0)
Platelets: 308 10*3/uL (ref 150–400)
RBC: 3.4 MIL/uL — ABNORMAL LOW (ref 3.87–5.11)
RDW: 17.6 % — ABNORMAL HIGH (ref 11.5–15.5)
WBC: 20.1 10*3/uL — ABNORMAL HIGH (ref 4.0–10.5)
nRBC: 0.1 % (ref 0.0–0.2)

## 2020-09-10 NOTE — Evaluation (Signed)
Physical Therapy Evaluation Patient Details Name: Priscilla Berry MRN: [id number] DOB: 03-Apr-1946 Today's Date: 09/10/2020   History of Present Illness  Pt is a 74 yo female diagnosed with a R tibia fracture and is s/p R tibial IM nail.  PMH includes polio/post polio syndrome, hypothyroidism, and PSVT.    Clinical Impression  Pt was pleasant and motivated to participate during the session.  Pt took a significant amount of time to go from sup to sit but ultimately did not require physical assistance to do so.  Pt required min A and cuing during sit to/from stand transfer training with pt's R foot placed on top of this PT's foot to ensure pt able to stand without exceeding WB limit.  Pt was able to take several effortful steps with a RW with min A and cuing for proper sequencing for WB compliance.  Pt reported no increased RLE pain during the session.  Pt will benefit from PT services in a SNF setting upon discharge to safely address deficits listed in patient problem list for decreased caregiver assistance and eventual return to PLOF.      Follow Up Recommendations SNF;Supervision for mobility/OOB    Equipment Recommendations  None recommended by PT    Recommendations for Other Services       Precautions / Restrictions Precautions Precautions: Fall Restrictions Weight Bearing Restrictions: Yes RLE Weight Bearing: Partial weight bearing RLE Partial Weight Bearing Percentage or Pounds: 30%      Mobility  Bed Mobility Overal bed mobility: Modified Independent             General bed mobility comments: Extra time and effort with Sup to Sit but no physical assistance required  Transfers Overall transfer level: Needs assistance Equipment used: Rolling walker (2 wheeled) Transfers: Sit to/from Stand Sit to Stand: Min assist         General transfer comment: Min A to stand from and elevated surface along with mod verbal and visual cues for sequencing for WB compliance;  pt's R foot placed on top of this PT's foot to ensure WB compliance  Ambulation/Gait Ambulation/Gait assistance: Min assist Gait Distance (Feet): 3 Feet Assistive device: Rolling walker (2 wheeled) Gait Pattern/deviations: Step-to pattern Gait velocity: decreased   General Gait Details: Mod visual and verbal cues for proper sequencing for WB compliance including forwards/backwards stepping and turning inside the RW; min A for stability and to advance the RW  Stairs            Wheelchair Mobility    Modified Rankin (Stroke Patients Only)       Balance Overall balance assessment: Needs assistance   Sitting balance-Leahy Scale: Normal     Standing balance support: Bilateral upper extremity supported;During functional activity Standing balance-Leahy Scale: Poor Standing balance comment: Min A for stability during functional activity with RW                             Pertinent Vitals/Pain Pain Assessment: 0-10 Pain Score: 5  Pain Location: R lower leg Pain Descriptors / Indicators: Sore;Aching Pain Intervention(s): Premedicated before session;Monitored during session    Home Living Family/patient expects to be discharged to:: Private residence Living Arrangements: Spouse/significant other Available Help at Discharge: Family;Available 24 hours/day Type of Home: House Home Access: Stairs to enter Entrance Stairs-Rails: Right;Left;Can reach both Entrance Stairs-Number of Steps: 4 Home Layout: One level Home Equipment: Bedside commode;Walker - 2 wheels;Cane - single point;Wheelchair - manual  Prior Function Level of Independence: Independent with assistive device(s)         Comments: Ind amb in the home without an AD with BLE AFOs, SPC for limited community ambulation, pt endorses around 1 fall/month secondary to RLE buckling     Hand Dominance        Extremity/Trunk Assessment   Upper Extremity Assessment Upper Extremity Assessment:  Overall WFL for tasks assessed    Lower Extremity Assessment Lower Extremity Assessment: Generalized weakness;RLE deficits/detail;LLE deficits/detail RLE Deficits / Details: Chronic LE weakness from polio including no active ankle DF/PF with RLE grossly weaker than the LLE RLE: Unable to fully assess due to pain LLE Deficits / Details: Chronic LE weakness from polio including no active ankle DF/PF with RLE grossly weaker than the LLE       Communication   Communication: No difficulties  Cognition Arousal/Alertness: Awake/alert Behavior During Therapy: WFL for tasks assessed/performed Overall Cognitive Status: Within Functional Limits for tasks assessed                                        General Comments      Exercises Total Joint Exercises Quad Sets: Strengthening;Both;10 reps Gluteal Sets: Strengthening;Both;10 reps   Assessment/Plan    PT Assessment Patient needs continued PT services  PT Problem List Decreased strength;Decreased activity tolerance;Decreased balance;Decreased mobility;Decreased knowledge of use of DME;Decreased safety awareness       PT Treatment Interventions DME instruction;Gait training;Stair training;Functional mobility training;Therapeutic activities;Therapeutic exercise;Balance training;Patient/family education    PT Goals (Current goals can be found in the Care Plan section)  Acute Rehab PT Goals Patient Stated Goal: To get back home PT Goal Formulation: With patient Time For Goal Achievement: 09/23/20 Potential to Achieve Goals: Good    Frequency BID   Barriers to discharge Inaccessible home environment      Co-evaluation               AM-PAC PT "6 Clicks" Mobility  Outcome Measure Help needed turning from your back to your side while in a flat bed without using bedrails?: A Little Help needed moving from lying on your back to sitting on the side of a flat bed without using bedrails?: A Little Help needed  moving to and from a bed to a chair (including a wheelchair)?: A Little Help needed standing up from a chair using your arms (e.g., wheelchair or bedside chair)?: A Little Help needed to walk in hospital room?: A Lot Help needed climbing 3-5 steps with a railing? : Total 6 Click Score: 15    End of Session Equipment Utilized During Treatment: Gait belt Activity Tolerance: Patient tolerated treatment well;No increased pain Patient left: in chair;with call bell/phone within reach;with chair alarm set Nurse Communication: Mobility status;Weight bearing status PT Visit Diagnosis: Unsteadiness on feet (R26.81);History of falling (Z91.81);Repeated falls (R29.6);Pain Pain - Right/Left: Right Pain - part of body: Leg    Time: 0918-1009 PT Time Calculation (min) (ACUTE ONLY): 51 min   Charges:   PT Evaluation $PT Eval Moderate Complexity: 1 Mod PT Treatments $Gait Training: 8-22 mins       D. Royetta Asal PT, DPT 09/10/20, 1:38 PM

## 2020-09-10 NOTE — Progress Notes (Signed)
Physical Therapy Treatment Patient Details Name: Priscilla Berry MRN: [id number] DOB: 1946-09-20 Today's Date: 09/10/2020    History of Present Illness Pt is a 74 yo female diagnosed with a R tibia fracture and is s/p R tibial IM nail.  PMH includes polio/post polio syndrome, hypothyroidism, and PSVT.    PT Comments    Pt was pleasant and motivated to participate during the session.  Pt reported increased RLE pain this PM and declined WB activities.  Pt tolerated below therex well with no increased RLE pain and with good effort throughout.  Pt reported no adverse symptoms during the session other than her baseline RLE pain but pt's SpO2 noted to be 88 to 89% at end of session. Nursing notified and put pt back on supplemental O2.  Pt will benefit from PT services in a SNF setting upon discharge to safely address deficits listed in patient problem list for decreased caregiver assistance and eventual return to PLOF.        Follow Up Recommendations  SNF;Supervision for mobility/OOB     Equipment Recommendations  None recommended by PT    Recommendations for Other Services       Precautions / Restrictions Precautions Precautions: Fall Restrictions Weight Bearing Restrictions: Yes RLE Weight Bearing: Partial weight bearing RLE Partial Weight Bearing Percentage or Pounds: 30%    Mobility  Bed Mobility Overal bed mobility: Modified Independent             General bed mobility comments: NT, pt declined all but supine therex this session  Transfers Overall transfer level: Needs assistance Equipment used: Rolling walker (2 wheeled) Transfers: Sit to/from Stand Sit to Stand: Min assist         General transfer comment: Min A to stand from and elevated surface along with mod verbal and visual cues for sequencing for WB compliance; pt's R foot placed on top of this PT's foot to ensure WB compliance  Ambulation/Gait Ambulation/Gait assistance: Min assist Gait Distance  (Feet): 3 Feet Assistive device: Rolling walker (2 wheeled) Gait Pattern/deviations: Step-to pattern Gait velocity: decreased   General Gait Details: Mod visual and verbal cues for proper sequencing for WB compliance including forwards/backwards stepping and turning inside the RW; min A for stability and to advance the RW   Stairs             Wheelchair Mobility    Modified Rankin (Stroke Patients Only)       Balance Overall balance assessment: Needs assistance   Sitting balance-Leahy Scale: Normal     Standing balance support: Bilateral upper extremity supported;During functional activity Standing balance-Leahy Scale: Poor Standing balance comment: Min A for stability during functional activity with RW                            Cognition Arousal/Alertness: Awake/alert Behavior During Therapy: WFL for tasks assessed/performed Overall Cognitive Status: Within Functional Limits for tasks assessed                                        Exercises Total Joint Exercises Quad Sets: Strengthening;Both;10 reps Gluteal Sets: Strengthening;Both;10 reps Towel Squeeze: Strengthening;Both;10 reps Short Arc Quad: Strengthening;Left;10 reps (with manual resistance) Heel Slides: Strengthening;Left;10 reps Hip ABduction/ADduction: AROM;AAROM;Both;10 reps (AAROM on the RLE) Straight Leg Raises: AROM;AAROM;Both;10 reps (AAROM on the RLE) Bridges: Left;Strengthening;10 reps (limited amplitude) Other Exercises Other  Exercises: Gentle B ankle PROM with holds at end range    General Comments        Pertinent Vitals/Pain Pain Assessment: 0-10 Pain Score: 8  Pain Location: R lower leg Pain Descriptors / Indicators: Sore;Aching Pain Intervention(s): Premedicated before session;Monitored during session    Home Living Family/patient expects to be discharged to:: Private residence Living Arrangements: Spouse/significant other Available Help at  Discharge: Family;Available 24 hours/day Type of Home: House Home Access: Stairs to enter Entrance Stairs-Rails: Right;Left;Can reach both Home Layout: One level Home Equipment: Bedside commode;Walker - 2 wheels;Cane - single point;Wheelchair - manual      Prior Function Level of Independence: Independent with assistive device(s)      Comments: Ind amb in the home without an AD with BLE AFOs, SPC for limited community ambulation, pt endorses around 1 fall/month secondary to RLE buckling   PT Goals (current goals can now be found in the care plan section) Acute Rehab PT Goals Patient Stated Goal: To get back home PT Goal Formulation: With patient Time For Goal Achievement: 09/23/20 Potential to Achieve Goals: Good Progress towards PT goals: Progressing toward goals    Frequency    BID      PT Plan Current plan remains appropriate    Co-evaluation              AM-PAC PT "6 Clicks" Mobility   Outcome Measure  Help needed turning from your back to your side while in a flat bed without using bedrails?: A Little Help needed moving from lying on your back to sitting on the side of a flat bed without using bedrails?: A Little Help needed moving to and from a bed to a chair (including a wheelchair)?: A Little Help needed standing up from a chair using your arms (e.g., wheelchair or bedside chair)?: A Little Help needed to walk in hospital room?: A Lot Help needed climbing 3-5 steps with a railing? : Total 6 Click Score: 15    End of Session Equipment Utilized During Treatment: Gait belt Activity Tolerance: Patient tolerated treatment well;No increased pain Patient left: in bed;with bed alarm set;with call bell/phone within reach;with family/visitor present;with SCD's reapplied Nurse Communication: Mobility status;Weight bearing status PT Visit Diagnosis: Unsteadiness on feet (R26.81);History of falling (Z91.81);Repeated falls (R29.6);Pain Pain - Right/Left: Right Pain -  part of body: Leg     Time: 0539-7673 PT Time Calculation (min) (ACUTE ONLY): 27 min  Charges:  $Gait Training: 8-22 mins $Therapeutic Exercise: 23-37 mins                     D. Scott Angelene Rome PT, DPT 09/10/20, 5:05 PM

## 2020-09-10 NOTE — Progress Notes (Signed)
   Subjective: 1 Day Post-Op Procedure(s) (LRB): INTRAMEDULLARY (IM) NAIL TIBIAL (Right) Patient reports pain as mild.   Patient is well, and has had no acute complaints or problems Denies any CP, SOB, ABD pain. We will continue therapy today.   Objective: Vital signs in last 24 hours: Temp:  [97.8 F (36.6 C)-99.3 F (37.4 C)] 98.4 F (36.9 C) (09/22 0802) Pulse Rate:  [63-87] 75 (09/22 0802) Resp:  [11-28] 18 (09/22 0802) BP: (92-139)/(55-71) 136/65 (09/22 0802) SpO2:  [91 %-100 %] 96 % (09/22 0802)  Intake/Output from previous day: 09/21 0701 - 09/22 0700 In: 358.6 [I.V.:8.6; IV Piggyback:350] Out: 600 [Urine:600] Intake/Output this shift: Total I/O In: -  Out: 500 [Urine:500]  Recent Labs    09/08/20 1413 09/09/20 1444 09/10/20 0426  HGB 12.9 11.9* 11.6*   Recent Labs    09/09/20 1444 09/10/20 0426  WBC 17.1* 20.1*  RBC 3.55* 3.40*  HCT 37.3 35.3*  PLT 297 308   Recent Labs    09/08/20 1413 09/08/20 1413 09/09/20 1444 09/10/20 0426  NA 138  --   --  136  K 3.8  --   --  4.1  CL 104  --   --  101  CO2 24  --   --  25  BUN 14  --   --  10  CREATININE 0.60   < > 0.57 0.52  GLUCOSE 120*  --   --  105*  CALCIUM 8.7*  --   --  8.2*   < > = values in this interval not displayed.   No results for input(s): LABPT, INR in the last 72 hours.  EXAM General - Patient is Alert, Appropriate and Oriented Extremity - Neurovascular intact Sensation intact distally Intact pulses distally Dorsiflexion/Plantar flexion intact No cellulitis present Compartment soft Dressing - dressing C/D/I and moderate drainage Motor Function - intact, moving foot and toes well on exam.   Past Medical History:  Diagnosis Date  . Abnormality of gait 03/19/2016  . Hyperthyroidism   . Polio   . PSVT (paroxysmal supraventricular tachycardia) ([facility])     Assessment/Plan:   1 Day Post-Op Procedure(s) (LRB): INTRAMEDULLARY (IM) NAIL TIBIAL (Right) Active Problems:   Closed  right tibial fracture  Estimated body mass index is 22.08 kg/m as calculated from the following:   Height as of this encounter: 5\' 7"  (1.702 m).   Weight as of this encounter: 64 kg. Advance diet Up with therapy. 30 % WB RLE Labs and vitals stable Pain controlled Will change dressing prior to discharge CM To assist with discharge   DVT Prophylaxis - Lovenox and TED hose 30 % Weight-Bearing as tolerated to right leg   T. Rachelle Hora, PA-C [place] 09/10/2020, 10:43 AM

## 2020-09-11 MED ORDER — OXYCODONE HCL 5 MG PO TABS: 5.0000 mg | ORAL_TABLET | ORAL | 0 refills | Status: DC | PRN

## 2020-09-11 MED ORDER — ASPIRIN EC 325 MG PO TBEC: 325.0000 mg | DELAYED_RELEASE_TABLET | Freq: Every day | ORAL | 0 refills | Status: AC

## 2020-09-11 NOTE — Discharge Instructions (Signed)
Diet: As you were doing prior to hospitalization   Shower: Keep dressing in incision sites clean and dry at all times  Activity:  Increase activity slowly as tolerated, but follow the weight bearing instructions below.    Weight Bearing: 30% weight bearing as tolerated to right lower extremity  To prevent constipation: you may use a stool softener such as -  Colace (over the counter) 100 mg by mouth twice a day  Drink plenty of fluids (prune juice may be helpful) and high fiber foods Miralax (over the counter) for constipation as needed.    Itching:  If you experience itching with your medications, try taking only a single pain pill, or even half a pain pill at a time.  You may take up to 10 pain pills per day, and you can also use benadryl over the counter for itching or also to help with sleep.   Precautions:  If you experience chest pain or shortness of breath - call 911 immediately for transfer to the hospital emergency department!!  If you develop a fever greater that 101 F, purulent drainage from wound, increased redness or drainage from wound, or calf pain-Call [facility]                                              Follow- Up Appointment:  Please call for an appointment to be seen in 2 weeks at [facility]

## 2020-09-11 NOTE — TOC Initial Note (Signed)
Transition of Care [facility]) - Initial/Assessment Note    Patient Details  Name: Priscilla Berry MRN: [id number] Date of Birth: 1946-05-03  Transition of Care [facility]) CM/SW Contact:    Shelbie Ammons, RN Phone Number: 09/11/2020, 1:28 PM  Clinical Narrative:     RNCM met with patient at bedside. Patient reports to feeling better today although pain is still there. Patient reports that it is her plan to go home. She reports that she has all of the necessary equipment at home due to her Polio and that her family is all close by and can offer all of the assistance she needs. Patient is open to home health services and does not have any preference as to which agency as long as they accept her insurance.  RNCM reached out to [place] with [facility] and she will accept referral.                    Expected Discharge Plan: Home w Home Health Services Barriers to Discharge: No Barriers Identified   Patient Goals and CMS Choice        Expected Discharge Plan and Services Expected Discharge Plan: [place]     Post Acute Care Choice: Home Health                                        Prior Living Arrangements/Services                       Activities of Daily Living Home Assistive Devices/Equipment: Brace (specify type) (bil leg braces has polio, WC, walker and cane) ADL Screening (condition at time of admission) Patient's cognitive ability adequate to safely complete daily activities?: Yes Is the patient deaf or have difficulty hearing?: No Does the patient have difficulty seeing, even when wearing glasses/contacts?: No Does the patient have difficulty concentrating, remembering, or making decisions?: No Patient able to express need for assistance with ADLs?: No Does the patient have difficulty dressing or bathing?: No Independently performs ADLs?: Yes (appropriate for developmental age) Does the patient have difficulty walking or climbing stairs?:  Yes Weakness of Legs: Right Weakness of Arms/Hands: Both  Permission Sought/Granted                  Emotional Assessment              Admission diagnosis:  Closed right tibial fracture [S82.201A] Closed fracture of proximal end of right tibia, unspecified fracture morphology, initial encounter [S82.101A] Patient Active Problem List   Diagnosis Date Noted  . Closed right tibial fracture 09/08/2020  . Hyperlipemia, mixed 10/27/2016  . Hemochromatosis 07/20/2016  . Polycythemia 04/21/2016  . Vitamin D deficiency, unspecified 04/21/2016  . Abnormality of gait 03/19/2016  . Polycythemia vera ([place]) 02/25/2016  . SVT (supraventricular tachycardia) ([place]) 12/16/2015  . PSVT (paroxysmal supraventricular tachycardia) ([place]) 11/11/2015  . Acquired hypothyroidism 10/06/2015  . 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

## 2020-09-11 NOTE — Discharge Summary (Signed)
Physician Discharge Summary  Patient ID: Priscilla Berry MRN: [id number] DOB/AGE: 72-Oct-1947 72 y.o.  Admit date: 09/08/2020 Discharge date: 09/11/2020  Admission Diagnoses:  Closed right tibial fracture [S82.201A] Closed fracture of proximal end of right tibia, unspecified fracture morphology, initial encounter [S82.101A]   Discharge Diagnoses: Patient Active Problem List   Diagnosis Date Noted  . Closed right tibial fracture 09/08/2020  . Hyperlipemia, mixed 10/27/2016  . Hemochromatosis 07/20/2016  . Polycythemia 04/21/2016  . Vitamin D deficiency, unspecified 04/21/2016  . Abnormality of gait 03/19/2016  . Polycythemia vera ([place]) 02/25/2016  . SVT (supraventricular tachycardia) ([place]) 12/16/2015  . PSVT (paroxysmal supraventricular tachycardia) ([place]) 11/11/2015  . Acquired hypothyroidism 10/06/2015  . 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 Abnormality of gait 03/19/2016  . Hyperthyroidism   . Polio   . PSVT (paroxysmal supraventricular tachycardia) ([place])      Transfusion: None   Consultants (if any): Treatment Team:  Hessie Knows, MD  Discharged Condition: Improved  Hospital Course: Priscilla Berry is an 74 y.o. female who was admitted 09/08/2020 with a diagnosis of displaced tibial shaft fracture right and went to the operating room on 09/09/2020 and underwent the above named procedures.    Surgeries: Procedure(s): INTRAMEDULLARY (IM) NAIL TIBIAL on 09/09/2020 Patient tolerated the surgery well. Taken to PACU where she was stabilized and then transferred to the orthopedic floor.  Started on Lovenox 30 mg every 24 hrs. Foot pumps applied bilaterally at 80 mm. Heels elevated on bed with rolled towels. No evidence of DVT. Negative [place]. Physical therapy started on day #1 for gait training and transfer. OT started day #1 for ADL and assisted devices.  Patient's foley was d/c on day #1. Patient's IV was d/c on day #2.  On  post op day #2 patient was stable and ready for discharge to home with HHPT.  Implants: Synthes tibial nail EX 9 x 330 with 4 interlocking screws  She was given perioperative antibiotics:  Anti-infectives (From admission, onward)   Start     Dose/Rate Route Frequency Ordered Stop   09/09/20 1400  ceFAZolin (ANCEF) IVPB 1 g/50 mL premix        1 g 100 mL/hr over 30 Minutes Intravenous Every 6 hours 09/09/20 1413 09/10/20 0334   09/09/20 1300  ceFAZolin (ANCEF) IVPB 1 g/50 mL premix        1 g 100 mL/hr over 30 Minutes Intravenous  Once 09/08/20 1530 09/09/20 1122    .  She was given sequential compression devices, early ambulation, and Aspirin for DVT prophylaxis.  She benefited maximally from the hospital stay and there were no complications.    Recent vital signs:  Vitals:   09/10/20 2353 09/11/20 0750  BP: 127/62 125/80  Pulse: (!) 104 87  Resp: 16 18  Temp: 98.7 F (37.1 C) 97.8 F (36.6 C)  SpO2: 94% 95%    Recent laboratory studies:  Lab Results  Component Value Date   HGB 11.6 (L) 09/10/2020   HGB 11.9 (L) 09/09/2020   HGB 12.9 09/08/2020   Lab Results  Component Value Date   WBC 20.1 (H) 09/10/2020   PLT 308 09/10/2020   No results found for: INR Lab Results  Component Value Date   NA 136 09/10/2020   K 4.1 09/10/2020   CL 101 09/10/2020   CO2 25 09/10/2020   BUN 10 09/10/2020   CREATININE 0.52 09/10/2020   GLUCOSE 105 (H) 09/10/2020  Discharge Medications:   Allergies as of 09/11/2020      Reactions   Bee Venom Anaphylaxis   Caffeine    Fever, chills, withdrawal symptoms      Medication List    TAKE these medications   Armour Thyroid 60 MG tablet Generic drug: thyroid Take 60 mg by mouth daily.   aspirin EC 325 MG tablet Take 1 tablet (325 mg total) by mouth daily.   cholecalciferol 1000 units tablet Commonly known as: VITAMIN D Take 1,000 Units by mouth daily.   Fish Oil + D3 1000-1000 MG-UNIT Caps Take 1,000 mg by mouth  daily.   hydroxyurea 500 MG capsule Commonly known as: HYDREA TAKE 1 CAPSULE (500 MG TOTAL) BY MOUTH DAILY. MAY TAKE WITH FOOD TO MINIMIZE GI SIDE EFFECTS.   L-Theanine 100 MG Caps Take 100 mg by mouth at bedtime as needed.   Magnesium 200 MG Tabs Take by mouth.   oxyCODONE 5 MG immediate release tablet Commonly known as: Oxy IR/ROXICODONE Take 1-2 tablets (5-10 mg total) by mouth every 4 (four) hours as needed for moderate pain (pain score 4-6).   PROBIOTIC DAILY PO Take 1 tablet by mouth daily.   RA Vitamin B-12 TR 1000 MCG Tbcr Generic drug: Cyanocobalamin Take 1,000 mcg by mouth daily.   Riboflavin 400 MG Caps Take 400 mg by mouth daily.            Durable Medical Equipment  (From admission, onward)         Start     Ordered   09/09/20 1414  DME Walker rolling  Once       Question Answer Comment  Walker: With 5 Inch Wheels   Patient needs a walker to treat with the following condition Closed right tibial fracture      09/09/20 1413   09/09/20 1414  DME 3 n 1  Once        09/09/20 1413   09/09/20 1414  DME Bedside commode  Once       Question:  Patient needs a bedside commode to treat with the following condition  Answer:  Closed right tibial fracture   09/09/20 1413          Diagnostic Studies: DG Chest 1 View  Result Date: 09/08/2020 CLINICAL DATA:  Fall.  Right leg fracture.  Preoperative evaluation. EXAM: CHEST  1 VIEW COMPARISON:  Chest radiograph 11/19/2015 FINDINGS: Few densities at the lung bases could represent a combination of mild atelectasis and scarring. No significant airspace disease. Negative for a pneumothorax. Heart and mediastinum are within normal limits. Trachea is midline. Chest bony structures are intact. IMPRESSION: No acute cardiopulmonary disease. Electronically Signed   By: Markus Daft M.D.   On: 09/08/2020 14:19   DG Tibia/Fibula Right  Result Date: 09/08/2020 CLINICAL DATA:  Fall, leg deformity. EXAM: RIGHT TIBIA AND FIBULA - 2  VIEW COMPARISON:  None. FINDINGS: Comminuted fracture deformities involving the proximal fibula and tibia demonstrating minimal medial displacement. Minimal shortening of the proximal fibular fracture. Knee and ankle joints remain approximated. Diffuse soft tissue swelling with anterior tibial soft tissue deformity. IMPRESSION: Medially displaced, comminuted proximal to mid tibial shaft fracture deformity. Comminuted proximal fibular shaft fracture with minimal medial displacement and shortening. Electronically Signed   By: Primitivo Gauze M.D.   On: 09/08/2020 14:20   DG Tibia/Fibula Right Port  Result Date: 09/09/2020 CLINICAL DATA:  Open reduction internal fixation for fracture EXAM: PORTABLE RIGHT TIBIA AND FIBULA - 2 VIEW COMPARISON:  September 08, 2020; intraoperative images September 09, 2020 FINDINGS: Frontal and lateral views were obtained. There is screw and rod fixation through a comminuted fracture of the mid tibia with alignment near anatomic in this area. There is a comminuted fracture of the proximal fibular diaphysis with lateral displacement of the distal major fracture fragment with respect to the major proximal fracture. Staples noted medially proximally and distally. No joint space narrowing or erosion evident. IMPRESSION: Screw and rod fixation through to comminuted fracture mid tibia with major fracture fragments in near anatomic alignment. Comminuted fracture proximal fibular diaphysis with lateral displacement distally. No dislocation. No appreciable underlying arthropathy. Electronically Signed   By: Lowella Grip III M.D.   On: 09/09/2020 13:25   DG C-Arm 1-60 Min-No Report  Result Date: 09/09/2020 Fluoroscopy was utilized by the requesting physician.  No radiographic interpretation.    Disposition:      Follow-up Information    Duanne Guess, PA-C Follow up in 2 week(s).   Specialties: Orthopedic Surgery, Emergency Medicine Contact information: [place] [address] [phone]                Signed: Feliberto Gottron 09/11/2020, 8:29 AM

## 2020-09-11 NOTE — Progress Notes (Signed)
   Subjective: 2 Days Post-Op Procedure(s) (LRB): INTRAMEDULLARY (IM) NAIL TIBIAL (Right) Patient reports pain as mild.   Patient is well, and has had no acute complaints or problems Denies any CP, SOB, ABD pain. We will continue therapy today.   Objective: Vital signs in last 24 hours: Temp:  [97.8 F (36.6 C)-98.7 F (37.1 C)] 97.8 F (36.6 C) (09/23 0750) Pulse Rate:  [76-104] 87 (09/23 0750) Resp:  [16-19] 18 (09/23 0750) BP: (123-127)/(58-80) 125/80 (09/23 0750) SpO2:  [91 %-95 %] 95 % (09/23 0750)  Intake/Output from previous day: 09/22 0701 - 09/23 0700 In: 0  Out: 1250 [Urine:1250] Intake/Output this shift: No intake/output data recorded.  Recent Labs    09/08/20 1413 09/09/20 1444 09/10/20 0426  HGB 12.9 11.9* 11.6*   Recent Labs    09/09/20 1444 09/10/20 0426  WBC 17.1* 20.1*  RBC 3.55* 3.40*  HCT 37.3 35.3*  PLT 297 308   Recent Labs    09/08/20 1413 09/08/20 1413 09/09/20 1444 09/10/20 0426  NA 138  --   --  136  K 3.8  --   --  4.1  CL 104  --   --  101  CO2 24  --   --  25  BUN 14  --   --  10  CREATININE 0.60   < > 0.57 0.52  GLUCOSE 120*  --   --  105*  CALCIUM 8.7*  --   --  8.2*   < > = values in this interval not displayed.   No results for input(s): LABPT, INR in the last 72 hours.  EXAM General - Patient is Alert, Appropriate and Oriented Extremity - Neurovascular intact Sensation intact distally Intact pulses distally No cellulitis present Compartment soft  Ankle plantarflexion dorsiflexion limited, at baseline Mild ecchymosis and swelling throughout right lower extremity Dressing - dressing C/D/I and moderate drainage Motor Function - intact, moving foot and toes well on exam.   Past Medical History:  Diagnosis Date  . Abnormality of gait 03/19/2016  . Hyperthyroidism   . Polio   . PSVT (paroxysmal supraventricular tachycardia) ([facility])     Assessment/Plan:   2 Days Post-Op Procedure(s) (LRB): INTRAMEDULLARY (IM)  NAIL TIBIAL (Right) Active Problems:   Closed right tibial fracture  Estimated body mass index is 22.08 kg/m as calculated from the following:   Height as of this encounter: 5\' 7"  (1.702 m).   Weight as of this encounter: 64 kg. Advance diet Up with therapy. 30 % WB RLE Labs and vitals stable Pain controlled Care management to assist with discharge.  Patient refusing skilled nursing facility, requesting to go home, she has lots of assistance.  Will check progress with PT today.  DVT Prophylaxis - Lovenox and TED hose 30 % Weight-Bearing as tolerated to right leg   T. Rachelle Hora, PA-C [place] 09/11/2020, 8:22 AM

## 2020-09-11 NOTE — Plan of Care (Signed)
  Problem: Education: Goal: Knowledge of General Education information will improve Description: Including pain rating scale, medication(s)/side effects and non-pharmacologic comfort measures Outcome: Progressing   Problem: Health Behavior/Discharge Planning: Goal: Ability to manage health-related needs will improve Outcome: Progressing   Problem: Clinical Measurements: Goal: Ability to maintain clinical measurements within normal limits will improve Outcome: Progressing Goal: Diagnostic test results will improve Outcome: Progressing   Problem: Physical Regulation: Goal: Postoperative complications will be avoided or minimized Outcome: Progressing   Problem: Pain Management: Goal: Pain level will decrease with appropriate interventions Outcome: Progressing

## 2020-09-11 NOTE — Care Management Important Message (Signed)
Important Message  Patient Details  Name: Priscilla Berry MRN: [id number] Date of Birth: 05-19-1946   Medicare Important Message Given:  Yes  Initial Medicare IM given by Patient Access Associate on 09/10/2020 at 10:34am.     Dannette Barbara 09/11/2020, 9:46 AM

## 2020-09-11 NOTE — Progress Notes (Signed)
Physical Therapy Treatment Patient Details Name: Priscilla Berry MRN: [id number] DOB: 13-Oct-1946 Today's Date: 09/11/2020    History of Present Illness Pt is a 74 yo female diagnosed with a R tibia fracture and is s/p R tibial IM nail.  PMH includes polio/post polio syndrome, hypothyroidism, and PSVT.    PT Comments    Pt was pleasant and motivated to participate during the session.  Pt required min A to stand during transfer training along with min A to prevent posterior LOB upon initial stand.  Pt motivated to improve gait distance this session and was able to amb 3 x 6 feet with seated therapeutic rest breaks between sessions.  Pt given verbal and visual cues for proper sequencing and was able to maintain WB compliance throughout with no reported increase in RLE pain during the session.  Pt's SpO2 remained in the low 90's during the session with HR WNL. Pt will benefit from PT services in a SNF setting upon discharge to safely address deficits listed in patient problem list for decreased caregiver assistance and eventual return to PLOF.     Follow Up Recommendations  SNF;Supervision for mobility/OOB     Equipment Recommendations  None recommended by PT    Recommendations for Other Services       Precautions / Restrictions Precautions Precautions: Fall Restrictions Weight Bearing Restrictions: Yes RLE Weight Bearing: Partial weight bearing RLE Partial Weight Bearing Percentage or Pounds: 30    Mobility  Bed Mobility Overal bed mobility: Modified Independent             General bed mobility comments: Extra time and effort only  Transfers Overall transfer level: Needs assistance Equipment used: Rolling walker (2 wheeled) Transfers: Sit to/from Stand Sit to Stand: Min assist         General transfer comment: Min A to stand from and elevated surface and to prevent posterior LOB upon initial stand; mod verbal and visual cues for sequencing for WB compliance and  improved stability  Ambulation/Gait Ambulation/Gait assistance: Min guard Gait Distance (Feet): 6 Feet x 3 Assistive device: Rolling walker (2 wheeled) Gait Pattern/deviations: Step-to pattern Gait velocity: decreased   General Gait Details: Mod visual and verbal cues for proper sequencing for WB compliance including forwards/backwards stepping and turning inside the RW   Stairs             Wheelchair Mobility    Modified Rankin (Stroke Patients Only)       Balance Overall balance assessment: Needs assistance   Sitting balance-Leahy Scale: Normal     Standing balance support: Bilateral upper extremity supported;During functional activity Standing balance-Leahy Scale: Poor Standing balance comment: Min A for stability during transfers to standing                            Cognition Arousal/Alertness: Awake/alert Behavior During Therapy: WFL for tasks assessed/performed Overall Cognitive Status: Within Functional Limits for tasks assessed                                        Exercises Total Joint Exercises Long Arc Quad: AROM;AAROM;Both;10 reps;5 reps (AAROM on the RLE) Knee Flexion: AROM;Both;5 reps;10 reps Other Exercises Other Exercises: Sit to/from stand transfer training from various height surfaces with visual demonstration and verbal cues    General Comments        Pertinent  Vitals/Pain Pain Assessment: 0-10 Pain Score: 4  Pain Location: R lower leg Pain Descriptors / Indicators: Sore;Aching Pain Intervention(s): Premedicated before session;Monitored during session    Home Living                      Prior Function            PT Goals (current goals can now be found in the care plan section) Progress towards PT goals: Progressing toward goals    Frequency    BID      PT Plan Current plan remains appropriate    Co-evaluation              AM-PAC PT "6 Clicks" Mobility   Outcome  Measure  Help needed turning from your back to your side while in a flat bed without using bedrails?: A Little Help needed moving from lying on your back to sitting on the side of a flat bed without using bedrails?: A Little Help needed moving to and from a bed to a chair (including a wheelchair)?: A Little Help needed standing up from a chair using your arms (e.g., wheelchair or bedside chair)?: A Little Help needed to walk in hospital room?: A Little Help needed climbing 3-5 steps with a railing? : Total 6 Click Score: 16    End of Session Equipment Utilized During Treatment: Gait belt Activity Tolerance: Patient tolerated treatment well;No increased pain Patient left: in chair;with chair alarm set;with call bell/phone within reach;with SCD's reapplied Nurse Communication: Mobility status;Weight bearing status PT Visit Diagnosis: Unsteadiness on feet (R26.81);History of falling (Z91.81);Repeated falls (R29.6);Pain Pain - Right/Left: Right Pain - part of body: Leg     Time: 5400-8676 PT Time Calculation (min) (ACUTE ONLY): 43 min  Charges:  $Gait Training: 8-22 mins $Therapeutic Exercise: 8-22 mins $Therapeutic Activity: 8-22 mins                     D. Scott Darion Milewski PT, DPT 09/11/20, 11:54 AM

## 2020-09-17 ENCOUNTER — Telehealth: Payer: Self-pay | Admitting: Oncology

## 2020-09-17 NOTE — Telephone Encounter (Signed)
Patient phoned on this date and stated that patient had leg surgery and is having a hard time getting around. Patient requested to reschedule patient's 09-26-20 appts to next month. Appts moved to 10-23-20 per patient's request.

## 2020-09-26 ENCOUNTER — Ambulatory Visit: Payer: Medicare Other | Admitting: Oncology

## 2020-09-26 ENCOUNTER — Other Ambulatory Visit: Payer: Medicare Other

## 2020-10-14 ENCOUNTER — Other Ambulatory Visit: Payer: Self-pay | Admitting: Oncology

## 2020-10-14 NOTE — Telephone Encounter (Signed)
CBC Order: [id number] Status:  Final result Visible to patient:  Yes (seen) Next appt:  10/23/2020 at 10:15 AM in Oncology ([facility])  0 Result Notes  Ref Range & Units 1 mo ago  (09/10/20) 1 mo ago  (09/09/20)  WBC 4.0 - 10.5 K/uL 20.1High  17.1High   RBC 3.87 - 5.11 MIL/uL 3.40Low  3.55Low   Hemoglobin 12.0 - 15.0 g/dL 11.6Low  11.9Low   HCT 36 - 46 % 35.3Low  37.3   MCV 80.0 - 100.0 fL 103.8High  105.1High   MCH 26.0 - 34.0 pg 34.1High  33.5   MCHC 30.0 - 36.0 g/dL 32.9  31.9   RDW 11.5 - 15.5 % 17.6High  17.9High   Platelets 150 - 400 K/uL 308  297   nRBC 0.0 - 0.2 % 0.1  0.1 CM   Comment: Performed at [facility], [address] [place], [place] [address]  Resulting Agency  [facility] CLIN LAB [facility]      Specimen Collected: 09/10/20 04:26 Last Resulted: 09/10/20 06:14         Basic metabolic panel Order: [id number] Status:  Final result Visible to patient:  Yes (seen) Next appt:  10/23/2020 at 10:15 AM in Oncology ([facility])  0 Result Notes  Ref Range & Units 1 mo ago  (09/10/20) 1 mo ago  (09/09/20)  Sodium 135 - 145 mmol/L 136    Potassium 3.5 - 5.1 mmol/L 4.1    Chloride 98 - 111 mmol/L 101    CO2 22 - 32 mmol/L 25    Glucose, Bld 70 - 99 mg/dL 105High    Comment: Glucose reference range applies only to samples taken after fasting for at least 8 hours.  BUN 8 - 23 mg/dL 10    Creatinine, Ser 0.44 - 1.00 mg/dL 0.52  0.57   Calcium 8.9 - 10.3 mg/dL 8.2Low    GFR calc non Af Amer >60 mL/min >60  >60   GFR calc Af Amer >60 mL/min >60  >60 CM   Anion gap 5 - 15 10    Comment: Performed at [facility], [address] [place], [place] [address]  Resulting Agency  [facility] CLIN LAB [facility] CLIN LAB      Specimen Collected: 09/10/20 04:26 Last Resulted: 09/10/20 06:35

## 2020-10-18 NOTE — Progress Notes (Signed)
[place]  Telephone:(336) [phone] Fax:(336) [phone]  ID: Jeryl Columbia OB: [birth year]  MR#: [id number]  DEY#:[id number]  Patient Care Team: Idelle Crouch, MD as PCP - General (Internal Medicine) Lloyd Huger, MD as Consulting Physician (Hematology and Oncology)   CHIEF COMPLAINT: JAK-2 positive, polycythemia vera, iron deficiency, heterozygous for hemochromatosis gene mutation H63D   INTERVAL HISTORY: Patient returns to clinic today for repeat laboratory work and further evaluation.  She continues to tolerate Hydrea well without significant side effects.  She recently had a fall and sustained a fractured leg requiring surgery and rod placement.  She has continued pain as well as weakness secondary to surgery, but states this continues to improve. She has no neurologic complaints.  She has a good appetite and denies weight loss.  She denies any fevers, night sweats, or weight loss.  She denies any chest pain, shortness of breath, cough, or hemoptysis.  She denies any nausea, vomiting, constipation, or diarrhea. She has no urinary complaints.  Patient offers no further specific complaints today.  REVIEW OF SYSTEMS:   Review of Systems  Constitutional: Negative.  Negative for fever, malaise/fatigue and weight loss.  HENT: Negative.  Negative for congestion and sinus pain.   Respiratory: Negative.  Negative for cough and shortness of breath.   Cardiovascular: Negative.  Negative for chest pain and leg swelling.  Gastrointestinal: Negative.  Negative for abdominal pain, constipation, diarrhea, nausea and vomiting.  Genitourinary: Negative.  Negative for dysuria.  Musculoskeletal: Negative.  Negative for back pain.  Neurological: Negative.  Negative for dizziness, weakness and headaches.  Endo/Heme/Allergies: Does not bruise/bleed easily.  Psychiatric/Behavioral: Negative.  The patient is not nervous/anxious and does not have insomnia.     As per HPI.  Otherwise, a complete review of systems is negative.  PAST MEDICAL HISTORY: Past Medical History:  Diagnosis Date  . Abnormality of gait 03/19/2016  . Hyperthyroidism   . Polio   . PSVT (paroxysmal supraventricular tachycardia) ([place])     PAST SURGICAL HISTORY: Past Surgical History:  Procedure Laterality Date  . BREAST BIOPSY Right 1999   EXCISIONAL - NEG  . BREAST BIOPSY Right 2012   EXCISIONAL - NEG  . BREAST SURGERY     breast biopsies  . COLONOSCOPY WITH PROPOFOL N/A 07/04/2015   Procedure: COLONOSCOPY WITH PROPOFOL;  Surgeon: Manya Silvas, MD;  Location: [facility] ENDOSCOPY;  Service: Endoscopy;  Laterality: N/A;  . ELECTROPHYSIOLOGIC STUDY N/A 12/16/2015   Procedure: SVT Ablation;  Surgeon: Evans Lance, MD;  Location: [place] CV LAB;  Service: Cardiovascular;  Laterality: N/A;  . TIBIA IM NAIL INSERTION Right 09/09/2020   Procedure: INTRAMEDULLARY (IM) NAIL TIBIAL;  Surgeon: Hessie Knows, MD;  Location: [facility] ORS;  Service: Orthopedics;  Laterality: Right;  . TONSILLECTOMY      FAMILY HISTORY: Reviewed and unchanged. No reported history of malignancy or chronic disease.     ADVANCED DIRECTIVES:    HEALTH MAINTENANCE: Social History   Tobacco Use  . Smoking status: Never Smoker  . Smokeless tobacco: Never Used  Substance Use Topics  . Alcohol use: No  . Drug use: Not on file     Colonoscopy:  PAP:  Bone density:  Lipid panel:  Allergies  Allergen Reactions  . Bee Venom Anaphylaxis  . Caffeine     Fever, chills, withdrawal symptoms    Current Outpatient Medications  Medication Sig Dispense Refill  . ARMOUR THYROID 60 MG tablet Take 60 mg by mouth daily.  4  .  aspirin EC 325 MG tablet Take 1 tablet (325 mg total) by mouth daily. 30 tablet 0  . cholecalciferol (VITAMIN D) 1000 units tablet Take 1,000 Units by mouth daily.    . Cyanocobalamin (RA VITAMIN B-12 TR) 1000 MCG TBCR Take 1,000 mcg by mouth daily.     . Fish Oil-Cholecalciferol (FISH OIL +  D3) 1000-1000 MG-UNIT CAPS Take 1,000 mg by mouth daily.     Marland Kitchen L-Theanine 100 MG CAPS Take 100 mg by mouth at bedtime as needed.     . Magnesium 200 MG TABS Take by mouth.    . oxyCODONE (OXY IR/ROXICODONE) 5 MG immediate release tablet Take 1-2 tablets (5-10 mg total) by mouth every 4 (four) hours as needed for moderate pain (pain score 4-6). 30 tablet 0  . Probiotic Product (PROBIOTIC DAILY PO) Take 1 tablet by mouth daily.    . Riboflavin 400 MG CAPS Take 400 mg by mouth daily.     . hydroxyurea (HYDREA) 500 MG capsule May take with food to minimize GI side effects. 90 capsule 3   No current facility-administered medications for this visit.    OBJECTIVE: Vitals:   10/23/20 1029  BP: (!) 111/57  Pulse: 81  Resp: 18  Temp: 97.8 F (36.6 C)  SpO2: 98%     Body mass index is 21.93 kg/m.    ECOG FS:0 - Asymptomatic   General: Well-developed, well-nourished, no acute distress.  Sitting in a wheelchair. Eyes: Pink conjunctiva, anicteric sclera. HEENT: Normocephalic, moist mucous membranes. Lungs: No audible wheezing or coughing. Heart: Regular rate and rhythm. Abdomen: Soft, nontender, no obvious distention. Musculoskeletal: No edema, cyanosis, or clubbing. Neuro: Alert, answering all questions appropriately. Cranial nerves grossly intact. Skin: No rashes or petechiae noted. Psych: Normal affect.   LAB RESULTS:  Lab Results  Component Value Date   NA 138 10/23/2020   K 3.8 10/23/2020   CL 105 10/23/2020   CO2 25 10/23/2020   GLUCOSE 147 (H) 10/23/2020   BUN 18 10/23/2020   CREATININE 0.43 (L) 10/23/2020   CALCIUM 9.1 10/23/2020   PROT 6.7 10/23/2020   ALBUMIN 4.0 10/23/2020   AST 24 10/23/2020   ALT 19 10/23/2020   ALKPHOS 112 10/23/2020   BILITOT 1.1 10/23/2020   GFRNONAA >60 10/23/2020   GFRAA >60 09/10/2020    Lab Results  Component Value Date   WBC 18.0 (H) 10/23/2020   NEUTROABS 14.3 (H) 10/23/2020   HGB 12.3 10/23/2020   HCT 39.0 10/23/2020   MCV 100.8  (H) 10/23/2020   PLT 326 10/23/2020   Lab Results  Component Value Date   IRON 61 10/23/2020   TIBC 311 10/23/2020   IRONPCTSAT 20 10/23/2020    Lab Results  Component Value Date   FERRITIN 152 10/23/2020     STUDIES: No results found.  ASSESSMENT: JAK-2 positive, polycythemia vera, iron deficiency, heterozygous for hemochromatosis gene mutation H63D   PLAN:    1. Polycythemia vera: JAK-2 mutation is positive. She does not require phlebotomy, but would consider in the future if necessary.  Patient has discontinued charcoal tabs.  Since decreasing her dose of Hydrea to 500 mg daily, her laboratory work has stabilized.  Hemoglobin is now within normal limits.  Her white blood cell count remains persistently elevated, but essentially unchanged.  No further intervention is needed.  Return to clinic in 3 months with repeat laboratory can further evaluation. 2. Leukocytosis: Chronic and unchanged.  Previously, BCR/ABL was negative for underlying CML.  Repeat peripheral  blood flow cytometry did not reveal any abnormality. Consider bone marrow biopsy in the future, but this is not necessary at this point.  Continue 5 mg Hydrea daily as above. 3. Hemochromatosis: Patient is heterozygote, therefore this is likely clinically insignificant.  Her most recent ferritin is 152.  Monitor. 4.  Iron deficiency: Resolved. 5.  Hyperbilirubinemia: Resolved. 6.  Fractured leg: Continue follow-up with orthopedics as scheduled.   Patient expressed understanding and was in agreement with this plan. She also understands that She can call clinic at any time with any questions, concerns, or complaints.    Lloyd Huger, MD 10/24/20 6:46 AM

## 2020-10-22 NOTE — Progress Notes (Signed)
Patient called for pre assessment. Denies any new concerns at this time. Would like to discuss refills on hydroxyurea and to know if she would be continuing on medication.

## 2020-10-23 ENCOUNTER — Inpatient Hospital Stay (HOSPITAL_BASED_OUTPATIENT_CLINIC_OR_DEPARTMENT_OTHER): Payer: Medicare Other | Admitting: Oncology

## 2020-10-23 ENCOUNTER — Inpatient Hospital Stay: Payer: Medicare Other | Attending: Oncology

## 2020-10-23 ENCOUNTER — Other Ambulatory Visit: Payer: Self-pay

## 2020-10-23 ENCOUNTER — Encounter: Payer: Self-pay | Admitting: Oncology

## 2020-10-23 VITALS — BP 111/57 | HR 81 | Temp 97.8°F | Resp 18 | Ht 67.0 in | Wt 140.0 lb

## 2020-10-23 DIAGNOSIS — D72829 Elevated white blood cell count, unspecified: Secondary | ICD-10-CM | POA: Diagnosis not present

## 2020-10-23 DIAGNOSIS — D45 Polycythemia vera: Secondary | ICD-10-CM

## 2020-10-23 DIAGNOSIS — Z7982 Long term (current) use of aspirin: Secondary | ICD-10-CM | POA: Diagnosis not present

## 2020-10-23 DIAGNOSIS — E611 Iron deficiency: Secondary | ICD-10-CM | POA: Insufficient documentation

## 2020-10-23 DIAGNOSIS — I471 Supraventricular tachycardia: Secondary | ICD-10-CM | POA: Insufficient documentation

## 2020-10-23 LAB — COMPREHENSIVE METABOLIC PANEL
ALT: 19 U/L (ref 0–44)
AST: 24 U/L (ref 15–41)
Albumin: 4 g/dL (ref 3.5–5.0)
Alkaline Phosphatase: 112 U/L (ref 38–126)
Anion gap: 8 (ref 5–15)
BUN: 18 mg/dL (ref 8–23)
CO2: 25 mmol/L (ref 22–32)
Calcium: 9.1 mg/dL (ref 8.9–10.3)
Chloride: 105 mmol/L (ref 98–111)
Creatinine, Ser: 0.43 mg/dL — ABNORMAL LOW (ref 0.44–1.00)
GFR, Estimated: >60 mL/min (ref >60)
Glucose, Bld: 147 mg/dL — ABNORMAL HIGH (ref 70–99)
Potassium: 3.8 mmol/L (ref 3.5–5.1)
Sodium: 138 mmol/L (ref 135–145)
Total Bilirubin: 1.1 mg/dL (ref 0.3–1.2)
Total Protein: 6.7 g/dL (ref 6.5–8.1)

## 2020-10-23 LAB — CBC WITH DIFFERENTIAL/PLATELET
Abs Immature Granulocytes: 0.94 10*3/uL — ABNORMAL HIGH (ref 0.00–0.07)
Basophils Absolute: 0.2 10*3/uL — ABNORMAL HIGH (ref 0.0–0.1)
Basophils Relative: 1 %
Eosinophils Absolute: 0.4 10*3/uL (ref 0.0–0.5)
Eosinophils Relative: 2 %
HCT: 39 % (ref 36.0–46.0)
Hemoglobin: 12.3 g/dL (ref 12.0–15.0)
Immature Granulocytes: 5 %
Lymphocytes Relative: 9 %
Lymphs Abs: 1.6 10*3/uL (ref 0.7–4.0)
MCH: 31.8 pg (ref 26.0–34.0)
MCHC: 31.5 g/dL (ref 30.0–36.0)
MCV: 100.8 fL — ABNORMAL HIGH (ref 80.0–100.0)
Monocytes Absolute: 0.6 10*3/uL (ref 0.1–1.0)
Monocytes Relative: 3 %
Neutro Abs: 14.3 10*3/uL — ABNORMAL HIGH (ref 1.7–7.7)
Neutrophils Relative %: 80 %
Platelets: 326 10*3/uL (ref 150–400)
RBC: 3.87 MIL/uL (ref 3.87–5.11)
RDW: 19.7 % — ABNORMAL HIGH (ref 11.5–15.5)
Smear Review: ADEQUATE
WBC: 18 10*3/uL — ABNORMAL HIGH (ref 4.0–10.5)
nRBC: 0.1 % (ref 0.0–0.2)

## 2020-10-23 LAB — IRON AND TIBC
Iron: 61 ug/dL (ref 28–170)
Saturation Ratios: 20 % (ref 10.4–31.8)
TIBC: 311 ug/dL (ref 250–450)
UIBC: 250 ug/dL

## 2020-10-23 LAB — FERRITIN: Ferritin: 152 ng/mL (ref 11–307)

## 2020-10-23 MED ORDER — HYDROXYUREA 500 MG PO CAPS: ORAL_CAPSULE | ORAL | 3 refills | Status: DC

## 2020-10-24 ENCOUNTER — Other Ambulatory Visit: Payer: Self-pay

## 2020-10-24 MED ORDER — HYDROXYUREA 500 MG PO CAPS: 500.0000 mg | ORAL_CAPSULE | Freq: Every day | ORAL | 3 refills | Status: DC

## 2021-01-23 ENCOUNTER — Inpatient Hospital Stay: Payer: Medicare Other

## 2021-01-26 ENCOUNTER — Inpatient Hospital Stay: Payer: Medicare Other | Admitting: Oncology

## 2021-02-12 ENCOUNTER — Inpatient Hospital Stay: Payer: Medicare Other | Attending: Oncology

## 2021-02-12 DIAGNOSIS — D45 Polycythemia vera: Secondary | ICD-10-CM | POA: Insufficient documentation

## 2021-02-12 DIAGNOSIS — Z7982 Long term (current) use of aspirin: Secondary | ICD-10-CM | POA: Insufficient documentation

## 2021-02-12 DIAGNOSIS — D72829 Elevated white blood cell count, unspecified: Secondary | ICD-10-CM | POA: Diagnosis not present

## 2021-02-12 DIAGNOSIS — Z79899 Other long term (current) drug therapy: Secondary | ICD-10-CM | POA: Insufficient documentation

## 2021-02-12 LAB — CBC WITH DIFFERENTIAL/PLATELET
Abs Immature Granulocytes: 0.58 10*3/uL — ABNORMAL HIGH (ref 0.00–0.07)
Basophils Absolute: 0.2 10*3/uL — ABNORMAL HIGH (ref 0.0–0.1)
Basophils Relative: 1 %
Eosinophils Absolute: 0.3 10*3/uL (ref 0.0–0.5)
Eosinophils Relative: 2 %
HCT: 41 % (ref 36.0–46.0)
Hemoglobin: 12.7 g/dL (ref 12.0–15.0)
Immature Granulocytes: 4 %
Lymphocytes Relative: 10 %
Lymphs Abs: 1.5 10*3/uL (ref 0.7–4.0)
MCH: 29.8 pg (ref 26.0–34.0)
MCHC: 31 g/dL (ref 30.0–36.0)
MCV: 96.2 fL (ref 80.0–100.0)
Monocytes Absolute: 0.4 10*3/uL (ref 0.1–1.0)
Monocytes Relative: 3 %
Neutro Abs: 12.1 10*3/uL — ABNORMAL HIGH (ref 1.7–7.7)
Neutrophils Relative %: 80 %
Platelets: 253 10*3/uL (ref 150–400)
RBC: 4.26 MIL/uL (ref 3.87–5.11)
RDW: 20.9 % — ABNORMAL HIGH (ref 11.5–15.5)
WBC: 15.1 10*3/uL — ABNORMAL HIGH (ref 4.0–10.5)
nRBC: 0 % (ref 0.0–0.2)

## 2021-02-12 LAB — IRON AND TIBC
Iron: 60 ug/dL (ref 28–170)
Saturation Ratios: 19 % (ref 10.4–31.8)
TIBC: 316 ug/dL (ref 250–450)
UIBC: 256 ug/dL

## 2021-02-12 LAB — COMPREHENSIVE METABOLIC PANEL
ALT: 18 U/L (ref 0–44)
AST: 25 U/L (ref 15–41)
Albumin: 4.1 g/dL (ref 3.5–5.0)
Alkaline Phosphatase: 110 U/L (ref 38–126)
Anion gap: 10 (ref 5–15)
BUN: 17 mg/dL (ref 8–23)
CO2: 25 mmol/L (ref 22–32)
Calcium: 8.8 mg/dL — ABNORMAL LOW (ref 8.9–10.3)
Chloride: 104 mmol/L (ref 98–111)
Creatinine, Ser: 0.61 mg/dL (ref 0.44–1.00)
GFR, Estimated: >60 mL/min (ref >60)
Glucose, Bld: 104 mg/dL — ABNORMAL HIGH (ref 70–99)
Potassium: 4 mmol/L (ref 3.5–5.1)
Sodium: 139 mmol/L (ref 135–145)
Total Bilirubin: 1.3 mg/dL — ABNORMAL HIGH (ref 0.3–1.2)
Total Protein: 6.7 g/dL (ref 6.5–8.1)

## 2021-02-12 LAB — FERRITIN: Ferritin: 121 ng/mL (ref 11–307)

## 2021-02-13 ENCOUNTER — Inpatient Hospital Stay (HOSPITAL_BASED_OUTPATIENT_CLINIC_OR_DEPARTMENT_OTHER): Payer: Medicare Other | Admitting: Oncology

## 2021-02-13 DIAGNOSIS — D45 Polycythemia vera: Secondary | ICD-10-CM

## 2021-02-13 NOTE — Progress Notes (Signed)
[place]  Telephone:(336) [phone] Fax:(336) [phone]  ID: Priscilla Berry OB: [birth year]  MR#: [id number]  QGB#:[id number]  Patient Care Team: Idelle Crouch, MD as PCP - General (Internal Medicine) Lloyd Huger, MD as Consulting Physician (Hematology and Oncology)   CHIEF COMPLAINT: JAK-2 positive, polycythemia vera, iron deficiency, heterozygous for hemochromatosis gene mutation H63D   I connected with Priscilla Berry on 02/13/21 at 11:30 AM EST by video enabled telemedicine visit and verified that I am speaking with the correct person using two identifiers.   I discussed the limitations, risks, security and privacy concerns of performing an evaluation and management service by telemedicine and the availability of in-person appointments. I also discussed with the patient that there may be a patient responsible charge related to this service. The patient expressed understanding and agreed to proceed.   Patient's location: Home  Provider's location: Clinic   INTERVAL HISTORY: Patient returns to clinic today for repeat laboratory work and further evaluation.  She was last seen by Dr. Grayland Ormond on 10/23/2020.  She continues to tolerate Hydrea well without significant side effects.  She continues to recover from her fall and fracture of left leg that required major surgery and rod placement.  She is able to bear weight on her left leg for short periods of time which is a huge improvement.  She is using a wheelchair still for long distances.  Pain has improved. She has no neurologic complaints.  She has a good appetite and denies weight loss.  She denies any fevers, night sweats, or weight loss.  She denies any chest pain, shortness of breath, cough, or hemoptysis.  She denies any nausea, vomiting, constipation, or diarrhea. She has no urinary complaints.  Patient offers no further specific complaints today.  REVIEW OF SYSTEMS:   Review of Systems   Constitutional: Negative.  Negative for fever, malaise/fatigue and weight loss.  HENT: Negative.  Negative for congestion and sinus pain.   Respiratory: Negative.  Negative for cough and shortness of breath.   Cardiovascular: Negative.  Negative for chest pain and leg swelling.  Gastrointestinal: Negative.  Negative for abdominal pain, constipation, diarrhea, nausea and vomiting.  Genitourinary: Negative for dysuria and urgency.  Musculoskeletal: Positive for joint pain (left leg pain d/t fracture). Negative for back pain.  Neurological: Negative.  Negative for dizziness, weakness and headaches.  Endo/Heme/Allergies: Does not bruise/bleed easily.  Psychiatric/Behavioral: Negative.  The patient is not nervous/anxious and does not have insomnia.     As per HPI. Otherwise, a complete review of systems is negative.  PAST MEDICAL HISTORY: Past Medical History:  Diagnosis Date  . Abnormality of gait 03/19/2016  . Hyperthyroidism   . Polio   . PSVT (paroxysmal supraventricular tachycardia) ([place])     PAST SURGICAL HISTORY: Past Surgical History:  Procedure Laterality Date  . BREAST BIOPSY Right 1999   EXCISIONAL - NEG  . BREAST BIOPSY Right 2012   EXCISIONAL - NEG  . BREAST SURGERY     breast biopsies  . COLONOSCOPY WITH PROPOFOL N/A 07/04/2015   Procedure: COLONOSCOPY WITH PROPOFOL;  Surgeon: Manya Silvas, MD;  Location: [facility] ENDOSCOPY;  Service: Endoscopy;  Laterality: N/A;  . ELECTROPHYSIOLOGIC STUDY N/A 12/16/2015   Procedure: SVT Ablation;  Surgeon: Evans Lance, MD;  Location: [place] CV LAB;  Service: Cardiovascular;  Laterality: N/A;  . TIBIA IM NAIL INSERTION Right 09/09/2020   Procedure: INTRAMEDULLARY (IM) NAIL TIBIAL;  Surgeon: Hessie Knows, MD;  Location: [facility] ORS;  Service: Orthopedics;  Laterality: Right;  . TONSILLECTOMY      FAMILY HISTORY: Reviewed and unchanged. No reported history of malignancy or chronic disease.     ADVANCED DIRECTIVES:    HEALTH  MAINTENANCE: Social History   Tobacco Use  . Smoking status: Never Smoker  . Smokeless tobacco: Never Used  Substance Use Topics  . Alcohol use: No     Colonoscopy:  PAP:  Bone density:  Lipid panel:  Allergies  Allergen Reactions  . Bee Venom Anaphylaxis  . Caffeine     Fever, chills, withdrawal symptoms    Current Outpatient Medications  Medication Sig Dispense Refill  . ARMOUR THYROID 60 MG tablet Take 60 mg by mouth daily.  4  . aspirin EC 325 MG tablet Take 1 tablet (325 mg total) by mouth daily. 30 tablet 0  . cholecalciferol (VITAMIN D) 1000 units tablet Take 1,000 Units by mouth daily.    . Cyanocobalamin (RA VITAMIN B-12 TR) 1000 MCG TBCR Take 1,000 mcg by mouth daily.     . Fish Oil-Cholecalciferol (FISH OIL + D3) 1000-1000 MG-UNIT CAPS Take 1,000 mg by mouth daily.     . hydroxyurea (HYDREA) 500 MG capsule Take 1 capsule (500 mg total) by mouth daily. May take with food to minimize GI side effects. 90 capsule 3  . L-Theanine 100 MG CAPS Take 100 mg by mouth at bedtime as needed.     . Magnesium 200 MG TABS Take by mouth.    . oxyCODONE (OXY IR/ROXICODONE) 5 MG immediate release tablet Take 1-2 tablets (5-10 mg total) by mouth every 4 (four) hours as needed for moderate pain (pain score 4-6). 30 tablet 0  . Probiotic Product (PROBIOTIC DAILY PO) Take 1 tablet by mouth daily.    . Riboflavin 400 MG CAPS Take 400 mg by mouth daily.      No current facility-administered medications for this visit.    OBJECTIVE: There were no vitals filed for this visit.   There is no height or weight on file to calculate BMI.    ECOG FS:0 - Asymptomatic   Physical Exam Constitutional:      Appearance: Normal appearance.  Pulmonary:     Effort: Pulmonary effort is normal.  Neurological:     Mental Status: She is alert and oriented to person, place, and time.     -Limited secondary to virtual visit.  LAB RESULTS:  Lab Results  Component Value Date   NA 139 02/12/2021    K 4.0 02/12/2021   CL 104 02/12/2021   CO2 25 02/12/2021   GLUCOSE 104 (H) 02/12/2021   BUN 17 02/12/2021   CREATININE 0.61 02/12/2021   CALCIUM 8.8 (L) 02/12/2021   PROT 6.7 02/12/2021   ALBUMIN 4.1 02/12/2021   AST 25 02/12/2021   ALT 18 02/12/2021   ALKPHOS 110 02/12/2021   BILITOT 1.3 (H) 02/12/2021   GFRNONAA >60 02/12/2021   GFRAA >60 09/10/2020    Lab Results  Component Value Date   WBC 15.1 (H) 02/12/2021   NEUTROABS 12.1 (H) 02/12/2021   HGB 12.7 02/12/2021   HCT 41.0 02/12/2021   MCV 96.2 02/12/2021   PLT 253 02/12/2021   Lab Results  Component Value Date   IRON 60 02/12/2021   TIBC 316 02/12/2021   IRONPCTSAT 19 02/12/2021    Lab Results  Component Value Date   FERRITIN 121 02/12/2021     STUDIES: No results found.  ASSESSMENT: JAK-2 positive, polycythemia vera, iron deficiency, heterozygous   for hemochromatosis gene mutation H63D   PLAN:    1. Polycythemia vera:  -JAK-2 mutation is positive. -Patient has discontinued charcoal tabs.   -Currently on hydroxyurea 500 mg daily. -Labs from 02/12/2021 show a platelet count of 253, hemoglobin 12.7, hematocrit 41. -Continue same dose of hydroxyurea  2. Leukocytosis:  -Previous work-up includes BCR able was negative for underlying CML. -Repeat peripheral flow cytometry did not reveal abnormality. -Lab work from 02/12/2021 shows a white count of 15.1 which is a slight decrease from previous. -Consider bone marrow biopsy in the future, but this is not necessary at this point.    3. Hemochromatosis:  -She is heterozygote, therefore this is likely clinically significant. -Previously noted to have an elevated ferritin. -Labs from today show a ferritin of 121.  4.  Iron deficiency: -Noted to have low ferritin levels in 2020. -Labs from 02/12/2021 show a ferritin of 121.  -We will continue to monitor.  5.  Hyperbilirubinemia:  -Has had an intermittent elevated total bili since 2021. -Labs from 02/12/2021  show a total bili of 1.3. -We will continue to monitor.  6.  Closed right tibial fracture: -Had surgery on 09/09/2020 s/p IM nail placement. -Appears to be recovering well. -Still needs to use wheelchair for long distances.  Disposition: -Return to clinic in 3 months with repeat lab work (1 day prior) and virtual visit(patient preference).  -Orders placed  Greater than 50% was spent in counseling and coordination of care with this patient including but not limited to discussion of the relevant topics above (See A&P) including, but not limited to diagnosis and management of acute and chronic medical conditions.    Patient expressed understanding and was in agreement with this plan. She also understands that She can call clinic at any time with any questions, concerns, or complaints.    Jacquelin Hawking, NP 02/13/21 11:39 AM

## 2021-05-07 NOTE — Progress Notes (Signed)
  [place]  Telephone:(336) [phone] Fax:(336) [phone]  ID: Jeryl Columbia OB: [birth year]  MR#: [id number]  QRF#:[id number]  Patient Care Team: Idelle Crouch, MD as PCP - General (Internal Medicine) Lloyd Huger, MD as Consulting Physician (Hematology and Oncology)    Lloyd Huger, MD 05/07/21 6:59 PM   This encounter was created in error - please disregard.

## 2021-05-13 ENCOUNTER — Inpatient Hospital Stay: Payer: Medicare Other | Attending: Oncology

## 2021-05-13 DIAGNOSIS — D45 Polycythemia vera: Secondary | ICD-10-CM | POA: Diagnosis present

## 2021-05-13 DIAGNOSIS — Z91030 Bee allergy status: Secondary | ICD-10-CM | POA: Insufficient documentation

## 2021-05-13 DIAGNOSIS — D72829 Elevated white blood cell count, unspecified: Secondary | ICD-10-CM | POA: Diagnosis not present

## 2021-05-13 DIAGNOSIS — Z79899 Other long term (current) drug therapy: Secondary | ICD-10-CM | POA: Diagnosis not present

## 2021-05-13 LAB — IRON AND TIBC
Iron: 53 ug/dL (ref 28–170)
Saturation Ratios: 16 % (ref 10.4–31.8)
TIBC: 339 ug/dL (ref 250–450)
UIBC: 286 ug/dL

## 2021-05-13 LAB — CBC WITH DIFFERENTIAL/PLATELET
Abs Immature Granulocytes: 0.48 10*3/uL — ABNORMAL HIGH (ref 0.00–0.07)
Basophils Absolute: 0.1 10*3/uL (ref 0.0–0.1)
Basophils Relative: 0 %
Eosinophils Absolute: 0 10*3/uL (ref 0.0–0.5)
Eosinophils Relative: 0 %
HCT: 42.9 % (ref 36.0–46.0)
Hemoglobin: 13.2 g/dL (ref 12.0–15.0)
Immature Granulocytes: 3 %
Lymphocytes Relative: 9 %
Lymphs Abs: 1.6 10*3/uL (ref 0.7–4.0)
MCH: 29.1 pg (ref 26.0–34.0)
MCHC: 30.8 g/dL (ref 30.0–36.0)
MCV: 94.7 fL (ref 80.0–100.0)
Monocytes Absolute: 0.5 10*3/uL (ref 0.1–1.0)
Monocytes Relative: 3 %
Neutro Abs: 15.7 10*3/uL — ABNORMAL HIGH (ref 1.7–7.7)
Neutrophils Relative %: 85 %
Platelets: 347 10*3/uL (ref 150–400)
RBC: 4.53 MIL/uL (ref 3.87–5.11)
RDW: 20.1 % — ABNORMAL HIGH (ref 11.5–15.5)
WBC: 18.3 10*3/uL — ABNORMAL HIGH (ref 4.0–10.5)
nRBC: 0.1 % (ref 0.0–0.2)

## 2021-05-13 LAB — COMPREHENSIVE METABOLIC PANEL
ALT: 21 U/L (ref 0–44)
AST: 26 U/L (ref 15–41)
Albumin: 4 g/dL (ref 3.5–5.0)
Alkaline Phosphatase: 130 U/L — ABNORMAL HIGH (ref 38–126)
Anion gap: 11 (ref 5–15)
BUN: 15 mg/dL (ref 8–23)
CO2: 24 mmol/L (ref 22–32)
Calcium: 8.8 mg/dL — ABNORMAL LOW (ref 8.9–10.3)
Chloride: 104 mmol/L (ref 98–111)
Creatinine, Ser: 0.51 mg/dL (ref 0.44–1.00)
GFR, Estimated: >60 mL/min (ref >60)
Glucose, Bld: 139 mg/dL — ABNORMAL HIGH (ref 70–99)
Potassium: 4.1 mmol/L (ref 3.5–5.1)
Sodium: 139 mmol/L (ref 135–145)
Total Bilirubin: 1 mg/dL (ref 0.3–1.2)
Total Protein: 6.5 g/dL (ref 6.5–8.1)

## 2021-05-13 LAB — FERRITIN: Ferritin: 66 ng/mL (ref 11–307)

## 2021-05-14 ENCOUNTER — Other Ambulatory Visit: Payer: Self-pay

## 2021-05-14 ENCOUNTER — Inpatient Hospital Stay: Payer: Medicare Other | Admitting: Oncology

## 2021-05-14 DIAGNOSIS — D45 Polycythemia vera: Secondary | ICD-10-CM

## 2021-05-14 NOTE — Progress Notes (Signed)
[place]  Telephone:(336) [phone] Fax:(336) [phone]  ID: Priscilla Berry OB: 31-May-1946  MR#: [id number]  EPP#:[id number]  Patient Care Team: Idelle Crouch, MD as PCP - General (Internal Medicine) Lloyd Huger, MD as Consulting Physician (Hematology and Oncology)   CHIEF COMPLAINT: JAK-2 positive, polycythemia vera, iron deficiency, heterozygous for hemochromatosis gene mutation H63D   INTERVAL HISTORY: Patient returns to clinic today for repeat laboratory work and routine evaluation.  She continues to improve from her fractured lower leg surgery and now only requires a walker intermittently.  She is tolerating Hydrea without significant side effects. She has no neurologic complaints.  She has a good appetite and denies weight loss.  She denies any fevers, night sweats, or weight loss.  She denies any chest pain, shortness of breath, cough, or hemoptysis.  She denies any nausea, vomiting, constipation, or diarrhea. She has no urinary complaints.  Patient offers no further specific complaints today.  REVIEW OF SYSTEMS:   Review of Systems  Constitutional: Negative.  Negative for fever, malaise/fatigue and weight loss.  HENT: Negative.  Negative for congestion and sinus pain.   Respiratory: Negative.  Negative for cough and shortness of breath.   Cardiovascular: Negative.  Negative for chest pain and leg swelling.  Gastrointestinal: Negative.  Negative for abdominal pain, constipation, diarrhea, nausea and vomiting.  Genitourinary: Negative.  Negative for dysuria.  Musculoskeletal: Negative.  Negative for back pain.  Neurological: Negative.  Negative for dizziness, weakness and headaches.  Endo/Heme/Allergies: Does not bruise/bleed easily.  Psychiatric/Behavioral: Negative.  The patient is not nervous/anxious and does not have insomnia.     As per HPI. Otherwise, a complete review of systems is negative.  PAST MEDICAL HISTORY: Past Medical History:   Diagnosis Date  . Abnormality of gait 03/19/2016  . Hyperthyroidism   . Polio   . PSVT (paroxysmal supraventricular tachycardia) ([place])     PAST SURGICAL HISTORY: Past Surgical History:  Procedure Laterality Date  . BREAST BIOPSY Right 1999   EXCISIONAL - NEG  . BREAST BIOPSY Right 2012   EXCISIONAL - NEG  . BREAST SURGERY     breast biopsies  . COLONOSCOPY WITH PROPOFOL N/A 07/04/2015   Procedure: COLONOSCOPY WITH PROPOFOL;  Surgeon: Manya Silvas, MD;  Location: [facility] ENDOSCOPY;  Service: Endoscopy;  Laterality: N/A;  . ELECTROPHYSIOLOGIC STUDY N/A 12/16/2015   Procedure: SVT Ablation;  Surgeon: Evans Lance, MD;  Location: [place] CV LAB;  Service: Cardiovascular;  Laterality: N/A;  . TIBIA IM NAIL INSERTION Right 09/09/2020   Procedure: INTRAMEDULLARY (IM) NAIL TIBIAL;  Surgeon: Hessie Knows, MD;  Location: [facility] ORS;  Service: Orthopedics;  Laterality: Right;  . TONSILLECTOMY      FAMILY HISTORY: Reviewed and unchanged. No reported history of malignancy or chronic disease.     ADVANCED DIRECTIVES:    HEALTH MAINTENANCE: Social History   Tobacco Use  . Smoking status: Never Smoker  . Smokeless tobacco: Never Used  Substance Use Topics  . Alcohol use: No     Colonoscopy:  PAP:  Bone density:  Lipid panel:  Allergies  Allergen Reactions  . Bee Venom Anaphylaxis  . Caffeine     Fever, chills, withdrawal symptoms    Current Outpatient Medications  Medication Sig Dispense Refill  . ARMOUR THYROID 60 MG tablet Take 90 mg by mouth daily.  4  . aspirin EC 325 MG tablet Take 1 tablet (325 mg total) by mouth daily. 30 tablet 0  . cholecalciferol (VITAMIN D) 1000  units tablet Take 1,000 Units by mouth daily.    . Cyanocobalamin 1000 MCG TBCR Take 1,000 mcg by mouth daily.     . Fish Oil-Cholecalciferol (FISH OIL + D3) 1000-1000 MG-UNIT CAPS Take 1,000 mg by mouth daily.     . hydroxyurea (HYDREA) 500 MG capsule Take 1 capsule (500 mg total) by mouth daily.  May take with food to minimize GI side effects. 90 capsule 3  . L-Theanine 100 MG CAPS Take 100 mg by mouth at bedtime as needed.     . Magnesium 200 MG TABS Take by mouth.    . Probiotic Product (PROBIOTIC DAILY PO) Take 1 tablet by mouth daily.    . Riboflavin 400 MG CAPS Take 400 mg by mouth daily.      No current facility-administered medications for this visit.    OBJECTIVE: Vitals:   05/19/21 1043  BP: 118/67  Pulse: 83  Resp: 17  Temp: 98.6 F (37 C)  SpO2: 97%     Body mass index is 21.82 kg/m.    ECOG FS:0 - Asymptomatic   General: Well-developed, well-nourished, no acute distress. Eyes: Pink conjunctiva, anicteric sclera. HEENT: Normocephalic, moist mucous membranes. Lungs: No audible wheezing or coughing. Heart: Regular rate and rhythm. Abdomen: Soft, nontender, no obvious distention. Musculoskeletal: No edema, cyanosis, or clubbing. Neuro: Alert, answering all questions appropriately. Cranial nerves grossly intact. Skin: No rashes or petechiae noted. Psych: Normal affect.  LAB RESULTS:  Lab Results  Component Value Date   NA 139 05/13/2021   K 4.1 05/13/2021   CL 104 05/13/2021   CO2 24 05/13/2021   GLUCOSE 139 (H) 05/13/2021   BUN 15 05/13/2021   CREATININE 0.51 05/13/2021   CALCIUM 8.8 (L) 05/13/2021   PROT 6.5 05/13/2021   ALBUMIN 4.0 05/13/2021   AST 26 05/13/2021   ALT 21 05/13/2021   ALKPHOS 130 (H) 05/13/2021   BILITOT 1.0 05/13/2021   GFRNONAA >60 05/13/2021   GFRAA >60 09/10/2020    Lab Results  Component Value Date   WBC 18.3 (H) 05/13/2021   NEUTROABS 15.7 (H) 05/13/2021   HGB 13.2 05/13/2021   HCT 42.9 05/13/2021   MCV 94.7 05/13/2021   PLT 347 05/13/2021   Lab Results  Component Value Date   IRON 53 05/13/2021   TIBC 339 05/13/2021   IRONPCTSAT 16 05/13/2021    Lab Results  Component Value Date   FERRITIN 66 05/13/2021     STUDIES: No results found.  ASSESSMENT: JAK-2 positive, polycythemia vera, iron deficiency,  heterozygous for hemochromatosis gene mutation H63D   PLAN:    1. Polycythemia vera: JAK-2 mutation is positive. She does not require phlebotomy, but would consider in the future if necessary.  Patient's hemoglobin continues to be within normal limits at 13.2.  Continue 500 mg Hydrea daily. Her white blood cell count remains persistently elevated, but essentially unchanged.  No further intervention is needed.  Return to clinic in 3 months with laboratory work and video assisted telemedicine visit with nurse practitioner and then in 6 months for laboratory work and in person evaluation with MD. 2. Leukocytosis: Chronic and unchanged.  Previously, BCR/ABL was negative for underlying CML.  Repeat peripheral blood flow cytometry did not reveal any abnormality.  Can consider bone marrow biopsy in the future, but this is not necessary at this point.  Continue Hydrea as above. 3. Hemochromatosis: Patient is heterozygote, therefore this is likely clinically her most recent ferritin is 66. 4.  Iron deficiency: Resolved. 5.  Hyperbilirubinemia: Resolved. 6.  Fractured leg: Improving.  Continue follow-up with orthopedics as scheduled.   Patient expressed understanding and was in agreement with this plan. She also understands that She can call clinic at any time with any questions, concerns, or complaints.    Lloyd Huger, MD 05/19/21 11:18 AM

## 2021-05-15 ENCOUNTER — Encounter: Payer: Self-pay | Admitting: Oncology

## 2021-05-19 ENCOUNTER — Encounter: Payer: Self-pay | Admitting: Oncology

## 2021-05-19 ENCOUNTER — Other Ambulatory Visit: Payer: Self-pay

## 2021-05-19 ENCOUNTER — Inpatient Hospital Stay (HOSPITAL_BASED_OUTPATIENT_CLINIC_OR_DEPARTMENT_OTHER): Payer: Medicare Other | Admitting: Oncology

## 2021-05-19 VITALS — BP 118/67 | HR 83 | Temp 98.6°F | Resp 17 | Wt 139.3 lb

## 2021-05-19 DIAGNOSIS — D45 Polycythemia vera: Secondary | ICD-10-CM | POA: Diagnosis not present

## 2021-08-14 ENCOUNTER — Inpatient Hospital Stay: Payer: Medicare Other | Attending: Oncology

## 2021-08-14 DIAGNOSIS — D45 Polycythemia vera: Secondary | ICD-10-CM | POA: Insufficient documentation

## 2021-08-14 DIAGNOSIS — D509 Iron deficiency anemia, unspecified: Secondary | ICD-10-CM | POA: Diagnosis not present

## 2021-08-14 LAB — IRON AND TIBC
Iron: 53 ug/dL (ref 28–170)
Saturation Ratios: 16 % (ref 10.4–31.8)
TIBC: 335 ug/dL (ref 250–450)
UIBC: 282 ug/dL

## 2021-08-14 LAB — CBC WITH DIFFERENTIAL/PLATELET
Abs Immature Granulocytes: 0.57 10*3/uL — ABNORMAL HIGH (ref 0.00–0.07)
Basophils Absolute: 0.2 10*3/uL — ABNORMAL HIGH (ref 0.0–0.1)
Basophils Relative: 1 %
Eosinophils Absolute: 0 10*3/uL (ref 0.0–0.5)
Eosinophils Relative: 0 %
HCT: 44.2 % (ref 36.0–46.0)
Hemoglobin: 13.6 g/dL (ref 12.0–15.0)
Immature Granulocytes: 3 %
Lymphocytes Relative: 9 %
Lymphs Abs: 1.9 10*3/uL (ref 0.7–4.0)
MCH: 28.5 pg (ref 26.0–34.0)
MCHC: 30.8 g/dL (ref 30.0–36.0)
MCV: 92.5 fL (ref 80.0–100.0)
Monocytes Absolute: 0.6 10*3/uL (ref 0.1–1.0)
Monocytes Relative: 3 %
Neutro Abs: 17.5 10*3/uL — ABNORMAL HIGH (ref 1.7–7.7)
Neutrophils Relative %: 84 %
Platelets: 377 10*3/uL (ref 150–400)
RBC: 4.78 MIL/uL (ref 3.87–5.11)
RDW: 21.9 % — ABNORMAL HIGH (ref 11.5–15.5)
Smear Review: NORMAL
WBC: 20.8 10*3/uL — ABNORMAL HIGH (ref 4.0–10.5)
nRBC: 0.1 % (ref 0.0–0.2)

## 2021-08-14 LAB — COMPREHENSIVE METABOLIC PANEL
ALT: 21 U/L (ref 0–44)
AST: 30 U/L (ref 15–41)
Albumin: 4.1 g/dL (ref 3.5–5.0)
Alkaline Phosphatase: 141 U/L — ABNORMAL HIGH (ref 38–126)
Anion gap: 8 (ref 5–15)
BUN: 19 mg/dL (ref 8–23)
CO2: 25 mmol/L (ref 22–32)
Calcium: 8.7 mg/dL — ABNORMAL LOW (ref 8.9–10.3)
Chloride: 104 mmol/L (ref 98–111)
Creatinine, Ser: 0.54 mg/dL (ref 0.44–1.00)
GFR, Estimated: >60 mL/min (ref >60)
Glucose, Bld: 116 mg/dL — ABNORMAL HIGH (ref 70–99)
Potassium: 4 mmol/L (ref 3.5–5.1)
Sodium: 137 mmol/L (ref 135–145)
Total Bilirubin: 1 mg/dL (ref 0.3–1.2)
Total Protein: 6.7 g/dL (ref 6.5–8.1)

## 2021-08-14 LAB — FERRITIN: Ferritin: 70 ng/mL (ref 11–307)

## 2021-08-17 ENCOUNTER — Inpatient Hospital Stay (HOSPITAL_BASED_OUTPATIENT_CLINIC_OR_DEPARTMENT_OTHER): Payer: Medicare Other | Admitting: Nurse Practitioner

## 2021-08-17 ENCOUNTER — Encounter: Payer: Self-pay | Admitting: Oncology

## 2021-08-17 DIAGNOSIS — D72829 Elevated white blood cell count, unspecified: Secondary | ICD-10-CM | POA: Diagnosis not present

## 2021-08-17 DIAGNOSIS — D45 Polycythemia vera: Secondary | ICD-10-CM

## 2021-08-17 NOTE — Progress Notes (Signed)
[place]  Telephone:(336) [phone] Fax:(336) [phone]  ID: Priscilla Berry OB: 1946/10/17  MR#: [id number]  GQQ#:[id number]  Patient Care Team: Idelle Crouch, MD as PCP - General (Internal Medicine) Lloyd Huger, MD as Consulting Physician (Hematology and Oncology)  Virtual Visit Progress Note  I connected with Priscilla Berry on 08/17/21 at  2:00 PM EDT by video enabled telemedicine visit and verified that I am speaking with the correct person using two identifiers.   I discussed the limitations, risks, security and privacy concerns of performing an evaluation and management service by telemedicine and the availability of in-person appointments. I also discussed with the patient that there may be a patient responsible charge related to this service. The patient expressed understanding and agreed to proceed.   Other persons participating in the visit and their role in the encounter: none   Patient's location: [facility]  Provider's location: home   CHIEF COMPLAINT: JAK-2 positive, polycythemia vera, iron deficiency, heterozygous for hemochromatosis gene mutation H63D   INTERVAL HISTORY: Patient returns to clinic today for repeat labs and further evaluation.  She continues to tolerate Hydrea well without significant side effects.  She denies neurologic complaints.  Her appetite is good and she denies weight loss.  She denies any fevers, chills, night sweats, unintentional weight loss.  No chest pain or shortness of breath.  No cough or hemoptysis.  No nausea, vomiting, constipation, diarrhea.  No urinary complaints.  Denies frequent infections.  No further specific complaints today.  REVIEW OF SYSTEMS:   Review of Systems  Constitutional: Negative.  Negative for fever, malaise/fatigue and weight loss.  HENT: Negative.  Negative for congestion and sinus pain.   Respiratory: Negative.  Negative for cough and shortness of breath.   Cardiovascular:  Negative.  Negative for chest pain and leg swelling.  Gastrointestinal: Negative.  Negative for abdominal pain, constipation, diarrhea, nausea and vomiting.  Genitourinary: Negative.  Negative for dysuria.  Musculoskeletal: Negative.  Negative for back pain.  Neurological: Negative.  Negative for dizziness, weakness and headaches.  Endo/Heme/Allergies:  Does not bruise/bleed easily.  Psychiatric/Behavioral: Negative.  The patient is not nervous/anxious and does not have insomnia.    PAST MEDICAL HISTORY: Past Medical History:  Diagnosis Date   Abnormality of gait 03/19/2016   Hyperthyroidism    Polio    PSVT (paroxysmal supraventricular tachycardia) ([place])     PAST SURGICAL HISTORY: Past Surgical History:  Procedure Laterality Date   BREAST BIOPSY Right 1999   EXCISIONAL - NEG   BREAST BIOPSY Right 2012   EXCISIONAL - NEG   BREAST SURGERY     breast biopsies   COLONOSCOPY WITH PROPOFOL N/A 07/04/2015   Procedure: COLONOSCOPY WITH PROPOFOL;  Surgeon: Manya Silvas, MD;  Location: [place];  Service: Endoscopy;  Laterality: N/A;   ELECTROPHYSIOLOGIC STUDY N/A 12/16/2015   Procedure: SVT Ablation;  Surgeon: Evans Lance, MD;  Location: [place] [facility];  Service: Cardiovascular;  Laterality: N/A;   TIBIA IM NAIL INSERTION Right 09/09/2020   Procedure: INTRAMEDULLARY (IM) NAIL TIBIAL;  Surgeon: Hessie Knows, MD;  Location: [facility];  Service: Orthopedics;  Laterality: Right;   TONSILLECTOMY      FAMILY HISTORY: Reviewed and unchanged.  No reported history of malignancy or chronic disease.    ADVANCED DIRECTIVES:   HEALTH MAINTENANCE: Social History   Tobacco Use   Smoking status: Never   Smokeless tobacco: Never  Substance Use Topics   Alcohol use: No  Colonoscopy:  PAP:  Bone density:  Lipid panel:  Allergies  Allergen Reactions   Bee Venom Anaphylaxis   Caffeine     Fever, chills, withdrawal symptoms    Current Outpatient Medications  Medication  Sig Dispense Refill   ARMOUR THYROID 60 MG tablet Take 90 mg by mouth daily.  4   aspirin EC 325 MG tablet Take 1 tablet (325 mg total) by mouth daily. 30 tablet 0   cholecalciferol (VITAMIN D) 1000 units tablet Take 1,000 Units by mouth daily.     Cyanocobalamin 1000 MCG TBCR Take 1,000 mcg by mouth daily.      Fish Oil-Cholecalciferol (FISH OIL + D3) 1000-1000 MG-UNIT CAPS Take 1,000 mg by mouth daily.      hydroxyurea (HYDREA) 500 MG capsule Take 1 capsule (500 mg total) by mouth daily. May take with food to minimize GI side effects. 90 capsule 3   L-Theanine 100 MG CAPS Take 100 mg by mouth at bedtime as needed.      Magnesium 200 MG TABS Take by mouth.     Probiotic Product (PROBIOTIC DAILY PO) Take 1 tablet by mouth daily.     Riboflavin 400 MG CAPS Take 400 mg by mouth daily.      No current facility-administered medications for this visit.    OBJECTIVE: ECOG FS:0 - Asymptomatic   Physical Exam Constitutional:      Appearance: She is not ill-appearing.  Pulmonary:     Effort: No respiratory distress.  Neurological:     Mental Status: She is alert and oriented to person, place, and time.  Psychiatric:        Mood and Affect: Mood normal.        Behavior: Behavior normal.     LAB RESULTS:  Lab Results  Component Value Date   NA 137 08/14/2021   K 4.0 08/14/2021   CL 104 08/14/2021   CO2 25 08/14/2021   GLUCOSE 116 (H) 08/14/2021   BUN 19 08/14/2021   CREATININE 0.54 08/14/2021   CALCIUM 8.7 (L) 08/14/2021   PROT 6.7 08/14/2021   ALBUMIN 4.1 08/14/2021   AST 30 08/14/2021   ALT 21 08/14/2021   ALKPHOS 141 (H) 08/14/2021   BILITOT 1.0 08/14/2021   GFRNONAA >60 08/14/2021   GFRAA >60 09/10/2020    Lab Results  Component Value Date   WBC 20.8 (H) 08/14/2021   NEUTROABS 17.5 (H) 08/14/2021   HGB 13.6 08/14/2021   HCT 44.2 08/14/2021   MCV 92.5 08/14/2021   PLT 377 08/14/2021   Lab Results  Component Value Date   IRON 53 08/14/2021   TIBC 335 08/14/2021    IRONPCTSAT 16 08/14/2021    Lab Results  Component Value Date   FERRITIN 70 08/14/2021     STUDIES: No results found.  ASSESSMENT: JAK-2 positive, polycythemia vera, iron deficiency, heterozygous for hemochromatosis gene mutation H63D   PLAN:    1. Polycythemia vera: JAK2 mutation is positive.  Labs today reviewed and hematocrit is 44.2, hemoglobin 13.6.  She does not require phlebotomy.  Continue 500 mg Hydrea daily.  Return to clinic in 3 months for labs and follow-up with Dr. Grayland Ormond  2. Leukocytosis: Chronic and unchanged.  Previously, BCR/ABL was negative for underlying CML.  Repeat peripheral blood cytometry did not reveal any abnormality.  Her ANC is somewhat uptrending.  May consider bone marrow biopsy in the future if continues to uptrend.  Continue Hydrea as above.  3. Hemochromatosis: Patient is heterozygous for hemochromatosis.  Ferritin is 70 which is normal.  Iron studies are also normal.  Monitor.  4.  Iron deficiency: Resolved.  5.  Hyperbilirubinemia: Resolved.  6.  Fractured leg: Improving.  Continue follow-up with orthopedics as scheduled.  Patient expressed understanding and was in agreement with this plan. She also understands that She can call clinic at any time with any questions, concerns, or complaints.   I discussed the assessment and treatment plan with the patient. The patient was provided an opportunity to ask questions and all were answered. The patient agreed with the plan and demonstrated an understanding of the instructions.   The patient was advised to call back or seek an in-person evaluation if the symptoms worsen or if the condition fails to improve as anticipated.   I spent 20 minutes face-to-face video visit time dedicated to the care of this patient on the date of this encounter to include pre-visit review of hematology notes, labs, face-to-face time with the patient, and post visit ordering of testing/documentation.   Verlon Au, NP  08/17/21 3:13 PM

## 2021-08-17 NOTE — Progress Notes (Signed)
Patient denies new problems/concerns today.   °

## 2021-10-14 ENCOUNTER — Other Ambulatory Visit: Payer: Self-pay | Admitting: Oncology

## 2021-11-16 ENCOUNTER — Telehealth: Payer: Self-pay | Admitting: Oncology

## 2021-11-16 NOTE — Progress Notes (Deleted)
[place]  Telephone:(336) [phone] Fax:(336) [phone]  ID: Priscilla Berry OB: [birth year]  MR#: [id number]  EYC#:[id number]  Patient Care Team: Idelle Crouch, MD as PCP - General (Internal Medicine) Lloyd Huger, MD as Consulting Physician (Hematology and Oncology)   CHIEF COMPLAINT: JAK-2 positive, polycythemia vera, iron deficiency, heterozygous for hemochromatosis gene mutation H63D   INTERVAL HISTORY: Patient returns to clinic today for repeat laboratory work and routine evaluation.  She continues to improve from her fractured lower leg surgery and now only requires a walker intermittently.  She is tolerating Hydrea without significant side effects. She has no neurologic complaints.  She has a good appetite and denies weight loss.  She denies any fevers, night sweats, or weight loss.  She denies any chest pain, shortness of breath, cough, or hemoptysis.  She denies any nausea, vomiting, constipation, or diarrhea. She has no urinary complaints.  Patient offers no further specific complaints today.  REVIEW OF SYSTEMS:   Review of Systems  Constitutional: Negative.  Negative for fever, malaise/fatigue and weight loss.  HENT: Negative.  Negative for congestion and sinus pain.   Respiratory: Negative.  Negative for cough and shortness of breath.   Cardiovascular: Negative.  Negative for chest pain and leg swelling.  Gastrointestinal: Negative.  Negative for abdominal pain, constipation, diarrhea, nausea and vomiting.  Genitourinary: Negative.  Negative for dysuria.  Musculoskeletal: Negative.  Negative for back pain.  Neurological: Negative.  Negative for dizziness, weakness and headaches.  Endo/Heme/Allergies:  Does not bruise/bleed easily.  Psychiatric/Behavioral: Negative.  The patient is not nervous/anxious and does not have insomnia.    As per HPI. Otherwise, a complete review of systems is negative.  PAST MEDICAL HISTORY: Past Medical History:   Diagnosis Date   Abnormality of gait 03/19/2016   Hyperthyroidism    Polio    PSVT (paroxysmal supraventricular tachycardia) ([place])     PAST SURGICAL HISTORY: Past Surgical History:  Procedure Laterality Date   BREAST BIOPSY Right 1999   EXCISIONAL - NEG   BREAST BIOPSY Right 2012   EXCISIONAL - NEG   BREAST SURGERY     breast biopsies   COLONOSCOPY WITH PROPOFOL N/A 07/04/2015   Procedure: COLONOSCOPY WITH PROPOFOL;  Surgeon: Manya Silvas, MD;  Location: [place];  Service: Endoscopy;  Laterality: N/A;   ELECTROPHYSIOLOGIC STUDY N/A 12/16/2015   Procedure: SVT Ablation;  Surgeon: Evans Lance, MD;  Location: [place] [facility];  Service: Cardiovascular;  Laterality: N/A;   TIBIA IM NAIL INSERTION Right 09/09/2020   Procedure: INTRAMEDULLARY (IM) NAIL TIBIAL;  Surgeon: Hessie Knows, MD;  Location: [facility];  Service: Orthopedics;  Laterality: Right;   TONSILLECTOMY      FAMILY HISTORY: Reviewed and unchanged. No reported history of malignancy or chronic disease.     ADVANCED DIRECTIVES:    HEALTH MAINTENANCE: Social History   Tobacco Use   Smoking status: Never   Smokeless tobacco: Never  Substance Use Topics   Alcohol use: No     Colonoscopy:  PAP:  Bone density:  Lipid panel:  Allergies  Allergen Reactions   Bee Venom Anaphylaxis   Caffeine     Fever, chills, withdrawal symptoms    Current Outpatient Medications  Medication Sig Dispense Refill   ARMOUR THYROID 60 MG tablet Take 90 mg by mouth daily.  4   aspirin EC 325 MG tablet Take 1 tablet (325 mg total) by mouth daily. 30 tablet 0   cholecalciferol (VITAMIN D) 1000 units tablet  Take 1,000 Units by mouth daily.     Cyanocobalamin 1000 MCG TBCR Take 1,000 mcg by mouth daily.      Fish Oil-Cholecalciferol (FISH OIL + D3) 1000-1000 MG-UNIT CAPS Take 1,000 mg by mouth daily.      hydroxyurea (HYDREA) 500 MG capsule TAKE 1 CAPSULE BY MOUTH DAILY. MAY TAKE WITH FOOD TO MINIMIZE GASTROINTESTINAL  SIDE EFFECTS. 90 capsule 3   L-Theanine 100 MG CAPS Take 100 mg by mouth at bedtime as needed.      Magnesium 200 MG TABS Take by mouth.     Probiotic Product (PROBIOTIC DAILY PO) Take 1 tablet by mouth daily.     Riboflavin 400 MG CAPS Take 400 mg by mouth daily.      No current facility-administered medications for this visit.    OBJECTIVE: There were no vitals filed for this visit.    There is no height or weight on file to calculate BMI.    ECOG FS:0 - Asymptomatic   General: Well-developed, well-nourished, no acute distress. Eyes: Pink conjunctiva, anicteric sclera. HEENT: Normocephalic, moist mucous membranes. Lungs: No audible wheezing or coughing. Heart: Regular rate and rhythm. Abdomen: Soft, nontender, no obvious distention. Musculoskeletal: No edema, cyanosis, or clubbing. Neuro: Alert, answering all questions appropriately. Cranial nerves grossly intact. Skin: No rashes or petechiae noted. Psych: Normal affect.  LAB RESULTS:  Lab Results  Component Value Date   NA 137 08/14/2021   K 4.0 08/14/2021   CL 104 08/14/2021   CO2 25 08/14/2021   GLUCOSE 116 (H) 08/14/2021   BUN 19 08/14/2021   CREATININE 0.54 08/14/2021   CALCIUM 8.7 (L) 08/14/2021   PROT 6.7 08/14/2021   ALBUMIN 4.1 08/14/2021   AST 30 08/14/2021   ALT 21 08/14/2021   ALKPHOS 141 (H) 08/14/2021   BILITOT 1.0 08/14/2021   GFRNONAA >60 08/14/2021   GFRAA >60 09/10/2020    Lab Results  Component Value Date   WBC 20.8 (H) 08/14/2021   NEUTROABS 17.5 (H) 08/14/2021   HGB 13.6 08/14/2021   HCT 44.2 08/14/2021   MCV 92.5 08/14/2021   PLT 377 08/14/2021   Lab Results  Component Value Date   IRON 53 08/14/2021   TIBC 335 08/14/2021   IRONPCTSAT 16 08/14/2021    Lab Results  Component Value Date   FERRITIN 70 08/14/2021     STUDIES: No results found.  ASSESSMENT: JAK-2 positive, polycythemia vera, iron deficiency, heterozygous for hemochromatosis gene mutation H63D   PLAN:    1.  Polycythemia vera: JAK-2 mutation is positive. She does not require phlebotomy, but would consider in the future if necessary.  Patient's hemoglobin continues to be within normal limits at 13.2.  Continue 500 mg Hydrea daily. Her white blood cell count remains persistently elevated, but essentially unchanged.  No further intervention is needed.  Return to clinic in 3 months with laboratory work and video assisted telemedicine visit with nurse practitioner and then in 6 months for laboratory work and in person evaluation with MD. 2. Leukocytosis: Chronic and unchanged.  Previously, BCR/ABL was negative for underlying CML.  Repeat peripheral blood flow cytometry did not reveal any abnormality.  Can consider bone marrow biopsy in the future, but this is not necessary at this point.  Continue Hydrea as above. 3. Hemochromatosis: Patient is heterozygote, therefore this is likely clinically her most recent ferritin is 66. 4.  Iron deficiency: Resolved. 5.  Hyperbilirubinemia: Resolved. 6.  Fractured leg: Improving.  Continue follow-up with orthopedics as scheduled.  Patient expressed understanding and was in agreement with this plan. She also understands that She can call clinic at any time with any questions, concerns, or complaints.    Lloyd Huger, MD 11/16/21 10:20 AM

## 2021-11-16 NOTE — Telephone Encounter (Signed)
Pt called in wondering about appt dates and times. There is a 11-29 abd 12-2 appts for lab and MD. Please give her a call at [phone]

## 2021-11-17 ENCOUNTER — Other Ambulatory Visit: Payer: Self-pay

## 2021-11-17 ENCOUNTER — Inpatient Hospital Stay (HOSPITAL_BASED_OUTPATIENT_CLINIC_OR_DEPARTMENT_OTHER): Payer: Medicare Other | Admitting: Oncology

## 2021-11-17 ENCOUNTER — Inpatient Hospital Stay: Payer: Medicare Other | Attending: Oncology

## 2021-11-17 VITALS — BP 109/59 | HR 94 | Temp 97.0°F | Resp 18 | Wt 134.0 lb

## 2021-11-17 DIAGNOSIS — D72829 Elevated white blood cell count, unspecified: Secondary | ICD-10-CM | POA: Diagnosis not present

## 2021-11-17 DIAGNOSIS — D45 Polycythemia vera: Secondary | ICD-10-CM | POA: Diagnosis present

## 2021-11-17 LAB — COMPREHENSIVE METABOLIC PANEL
ALT: 17 U/L (ref 0–44)
AST: 25 U/L (ref 15–41)
Albumin: 4 g/dL (ref 3.5–5.0)
Alkaline Phosphatase: 147 U/L — ABNORMAL HIGH (ref 38–126)
Anion gap: 9 (ref 5–15)
BUN: 15 mg/dL (ref 8–23)
CO2: 23 mmol/L (ref 22–32)
Calcium: 8.7 mg/dL — ABNORMAL LOW (ref 8.9–10.3)
Chloride: 106 mmol/L (ref 98–111)
Creatinine, Ser: 0.52 mg/dL (ref 0.44–1.00)
GFR, Estimated: >60 mL/min (ref >60)
Glucose, Bld: 158 mg/dL — ABNORMAL HIGH (ref 70–99)
Potassium: 3.9 mmol/L (ref 3.5–5.1)
Sodium: 138 mmol/L (ref 135–145)
Total Bilirubin: 1.1 mg/dL (ref 0.3–1.2)
Total Protein: 6.5 g/dL (ref 6.5–8.1)

## 2021-11-17 LAB — CBC WITH DIFFERENTIAL/PLATELET
Abs Immature Granulocytes: 1.13 10*3/uL — ABNORMAL HIGH (ref 0.00–0.07)
Basophils Absolute: 0.2 10*3/uL — ABNORMAL HIGH (ref 0.0–0.1)
Basophils Relative: 1 %
Eosinophils Absolute: 0.4 10*3/uL (ref 0.0–0.5)
Eosinophils Relative: 2 %
HCT: 41.7 % (ref 36.0–46.0)
Hemoglobin: 12.8 g/dL (ref 12.0–15.0)
Immature Granulocytes: 5 %
Lymphocytes Relative: 7 %
Lymphs Abs: 1.7 10*3/uL (ref 0.7–4.0)
MCH: 29 pg (ref 26.0–34.0)
MCHC: 30.7 g/dL (ref 30.0–36.0)
MCV: 94.6 fL (ref 80.0–100.0)
Monocytes Absolute: 0.7 10*3/uL (ref 0.1–1.0)
Monocytes Relative: 3 %
Neutro Abs: 19.1 10*3/uL — ABNORMAL HIGH (ref 1.7–7.7)
Neutrophils Relative %: 82 %
Platelets: 358 10*3/uL (ref 150–400)
RBC: 4.41 MIL/uL (ref 3.87–5.11)
RDW: 21.1 % — ABNORMAL HIGH (ref 11.5–15.5)
WBC: 23.2 10*3/uL — ABNORMAL HIGH (ref 4.0–10.5)
nRBC: 0.1 % (ref 0.0–0.2)

## 2021-11-17 LAB — IRON AND TIBC
Iron: 59 ug/dL (ref 28–170)
Saturation Ratios: 18 % (ref 10.4–31.8)
TIBC: 321 ug/dL (ref 250–450)
UIBC: 262 ug/dL

## 2021-11-17 LAB — FERRITIN: Ferritin: 89 ng/mL (ref 11–307)

## 2021-11-17 NOTE — Progress Notes (Signed)
[place]  Telephone:(336) [phone] Fax:(336) [phone]  ID: Priscilla Berry OB: [birth year]  MR#: [id number]  OIB#:[id number]  Patient Care Team: Idelle Crouch, MD as PCP - General (Internal Medicine) Lloyd Huger, MD as Consulting Physician (Hematology and Oncology)   CHIEF COMPLAINT: JAK-2 positive, polycythemia vera, iron deficiency, heterozygous for hemochromatosis gene mutation H63D   INTERVAL HISTORY: Patient turns to clinic today for repeat laboratory work and routine 69-monthevaluation.  She fully recovered from her fractured right lower leg, but now has a fractured foot and is in a wheelchair today.  She otherwise feels well.  She continues to tolerate Hydrea without significant side effects. She has no neurologic complaints.  She has a good appetite and denies weight loss.  She denies any fevers, night sweats, or weight loss.  She denies any chest pain, shortness of breath, cough, or hemoptysis.  She denies any nausea, vomiting, constipation, or diarrhea. She has no urinary complaints.  Patient offers no further specific complaints today.  REVIEW OF SYSTEMS:   Review of Systems  Constitutional: Negative.  Negative for fever, malaise/fatigue and weight loss.  HENT: Negative.  Negative for congestion and sinus pain.   Respiratory: Negative.  Negative for cough and shortness of breath.   Cardiovascular: Negative.  Negative for chest pain and leg swelling.  Gastrointestinal: Negative.  Negative for abdominal pain, constipation, diarrhea, nausea and vomiting.  Genitourinary: Negative.  Negative for dysuria.  Musculoskeletal: Negative.  Negative for back pain.  Neurological: Negative.  Negative for dizziness, weakness and headaches.  Endo/Heme/Allergies:  Does not bruise/bleed easily.  Psychiatric/Behavioral: Negative.  The patient is not nervous/anxious and does not have insomnia.    As per HPI. Otherwise, a complete review of systems is  negative.  PAST MEDICAL HISTORY: Past Medical History:  Diagnosis Date   Abnormality of gait 03/19/2016   Hyperthyroidism    Polio    PSVT (paroxys[place]ventricular tachycardia) [place]     PAST SURGICAL HISTORY: Past Surgical History:  Procedure Laterality Date   BREAST BIOPSY Right 1999   EXCISIONAL - NEG   BREAST BIOPSY Right 2012   EXCISIONAL - NEG   BREAST SURGERY     breast biopsies   COLONOSCOPY WITH PROPOFOL N/A 07/04/2015   Procedure: COLONOSCOPY WITH PROPOFOL;  Surgeon: RManya Silvas MD;  Location: [place]  Service: Endoscopy;  Laterality: N/A;   ELECTROPHYSIOLOGIC STUDY N/A 12/16/2015   Procedure: SVT Ablation;  Surgeon: GEvans Lance MD;  [facility];  Service: Cardiovascular;  Laterality: N/A;   TIBIA IM NAIL INSERTION Right 09/09/2020   Procedure: INTRAMEDULLARY (IM) NAIL TIBIAL;  Surgeon: MHessie Knows MD;  Location: [facility];  Service: Orthopedics;  Laterality: Right;   TONSILLECTOMY      FAMILY HISTORY: Reviewed and unchanged. No reported history of malignancy or chronic disease.     ADVANCED DIRECTIVES:    HEALTH MAINTENANCE: Social History   Tobacco Use   Smoking status: Never   Smokeless tobacco: Never  Substance Use Topics   Alcohol use: No     Colonoscopy:  PAP:  Bone density:  Lipid panel:  Allergies  Allergen Reactions   Bee Venom Anaphylaxis   Caffeine     Fever, chills, withdrawal symptoms    Current Outpatient Medications  Medication Sig Dispense Refill   ARMOUR THYROID 60 MG tablet Take 90 mg by mouth daily.  4   aspirin EC 325 MG tablet Take 1 tablet (325 mg total) by mouth daily.  30 tablet 0   cholecalciferol (VITAMIN D) 1000 units tablet Take 1,000 Units by mouth daily.     Cyanocobalamin 1000 MCG TBCR Take 1,000 mcg by mouth daily.      Fish Oil-Cholecalciferol (FISH OIL + D3) 1000-1000 MG-UNIT CAPS Take 1,000 mg by mouth daily.      hydroxyurea (HYDREA) 500 MG capsule TAKE 1 CAPSULE BY MOUTH  DAILY. MAY TAKE WITH FOOD TO MINIMIZE GASTROINTESTINAL SIDE EFFECTS. 90 capsule 3   L-Theanine 100 MG CAPS Take 100 mg by mouth at bedtime as needed.      Magnesium 200 MG TABS Take by mouth.     Probiotic Product (PROBIOTIC DAILY PO) Take 1 tablet by mouth daily.     Riboflavin 400 MG CAPS Take 400 mg by mouth daily.      HYDROcodone-acetaminophen (NORCO/VICODIN) 5-325 MG tablet hydrocodone 5 mg-acetaminophen 325 mg tablet  TAKE 1 TABLET BY MOUTH EVERY 4 HOURS AS NEEDED. (Patient not taking: Reported on 11/17/2021)     tiZANidine (ZANAFLEX) 4 MG tablet tizanidine 4 mg tablet  TAKE 1 TABLET (4 MG TOTAL) BY MOUTH 3 (THREE) TIMES DAILY AS NEEDED FOR UP TO 10 DAYS (Patient not taking: Reported on 11/17/2021)     No current facility-administered medications for this visit.    OBJECTIVE: Vitals:   11/17/21 1421  BP: (!) 109/59  Pulse: 94  Resp: 18  Temp: (!) 97 F (36.1 C)  SpO2: 97%     Body mass index is 20.99 kg/m.    ECOG FS:0 - Asymptomatic   General: Well-developed, well-nourished, no acute distress.  Sitting in a wheelchair. Eyes: Pink conjunctiva, anicteric sclera. HEENT: Normocephalic, moist mucous membranes. Lungs: No audible wheezing or coughing. Heart: Regular rate and rhythm. Abdomen: Soft, nontender, no obvious distention. Musculoskeletal: No edema, cyanosis, or clubbing. Neuro: Alert, answering all questions appropriately. Cranial nerves grossly intact. Skin: No rashes or petechiae noted. Psych: Normal affect.  LAB RESULTS:  Lab Results  Component Value Date   NA 138 11/17/2021   K 3.9 11/17/2021   CL 106 11/17/2021   CO2 23 11/17/2021   GLUCOSE 158 (H) 11/17/2021   BUN 15 11/17/2021   CREATININE 0.52 11/17/2021   CALCIUM 8.7 (L) 11/17/2021   PROT 6.5 11/17/2021   ALBUMIN 4.0 11/17/2021   AST 25 11/17/2021   ALT 17 11/17/2021   ALKPHOS 147 (H) 11/17/2021   BILITOT 1.1 11/17/2021   GFRNONAA >60 11/17/2021   GFRAA >60 09/10/2020    Lab Results   Component Value Date   WBC 23.2 (H) 11/17/2021   NEUTROABS 19.1 (H) 11/17/2021   HGB 12.8 11/17/2021   HCT 41.7 11/17/2021   MCV 94.6 11/17/2021   PLT 358 11/17/2021   Lab Results  Component Value Date   IRON 59 11/17/2021   TIBC 321 11/17/2021   IRONPCTSAT 18 11/17/2021    Lab Results  Component Value Date   FERRITIN 89 11/17/2021     STUDIES: No results found.  ASSESSMENT: JAK-2 positive, polycythemia vera, iron deficiency, heterozygous for hemochromatosis gene mutation H63D   PLAN:    1. Polycythemia vera: JAK-2 mutation is positive. She does not require phlebotomy, but would consider in the future if necessary.  Patient's hemoglobin continues to be within normal limits at 12.8 today.  Continue 500 mg Hydrea daily. Her white blood cell count remains persistently elevated, but essentially unchanged.  No further intervention is needed.  Return to clinic in 3 months for laboratory work only and then in 6  months with repeat laboratory work and further evaluation. 2. Leukocytosis: Chronic and unchanged.  Previously, BCR/ABL was negative for underlying CML.  Repeat peripheral blood flow cytometry did not reveal any abnormality.  Can consider bone marrow biopsy in the future, but this is not necessary at this point.  Continue Hydrea as above. 3. Hemochromatosis: Patient is heterozygote.  Likely clinically insignificant.  Her most recent ferritin is 89. 4.  Iron deficiency: Resolved. 5.  Hyperbilirubinemia: Resolved. 6.  Fractured leg/foot: Continue follow-up with orthopedics as scheduled.  Patient reports her frequent fractures are likely secondary to her distant history of polio.  Patient expressed understanding and was in agreement with this plan. She also understands that She can call clinic at any time with any questions, concerns, or complaints.    Lloyd Huger, MD 11/18/21 5:29 AM

## 2021-11-18 ENCOUNTER — Encounter: Payer: Self-pay | Admitting: Oncology

## 2021-11-19 ENCOUNTER — Other Ambulatory Visit: Payer: Medicare Other

## 2021-11-19 ENCOUNTER — Ambulatory Visit: Payer: Medicare Other | Admitting: Oncology

## 2022-02-10 ENCOUNTER — Other Ambulatory Visit: Payer: Self-pay | Admitting: *Deleted

## 2022-02-10 DIAGNOSIS — D45 Polycythemia vera: Secondary | ICD-10-CM

## 2022-02-16 ENCOUNTER — Other Ambulatory Visit: Payer: Self-pay

## 2022-02-16 ENCOUNTER — Inpatient Hospital Stay: Payer: Medicare Other | Attending: Oncology

## 2022-02-16 DIAGNOSIS — D45 Polycythemia vera: Secondary | ICD-10-CM | POA: Insufficient documentation

## 2022-02-16 DIAGNOSIS — D72829 Elevated white blood cell count, unspecified: Secondary | ICD-10-CM | POA: Insufficient documentation

## 2022-02-16 LAB — CBC WITH DIFFERENTIAL/PLATELET
Abs Immature Granulocytes: 0.88 10*3/uL — ABNORMAL HIGH (ref 0.00–0.07)
Basophils Absolute: 0.3 10*3/uL — ABNORMAL HIGH (ref 0.0–0.1)
Basophils Relative: 1 %
Eosinophils Absolute: 0.3 10*3/uL (ref 0.0–0.5)
Eosinophils Relative: 1 %
HCT: 48.4 % — ABNORMAL HIGH (ref 36.0–46.0)
Hemoglobin: 14.7 g/dL (ref 12.0–15.0)
Immature Granulocytes: 4 %
Lymphocytes Relative: 9 %
Lymphs Abs: 2 10*3/uL (ref 0.7–4.0)
MCH: 27.6 pg (ref 26.0–34.0)
MCHC: 30.4 g/dL (ref 30.0–36.0)
MCV: 91 fL (ref 80.0–100.0)
Monocytes Absolute: 0.6 10*3/uL (ref 0.1–1.0)
Monocytes Relative: 3 %
Neutro Abs: 18.2 10*3/uL — ABNORMAL HIGH (ref 1.7–7.7)
Neutrophils Relative %: 82 %
Platelets: 349 10*3/uL (ref 150–400)
RBC: 5.32 MIL/uL — ABNORMAL HIGH (ref 3.87–5.11)
RDW: 20.8 % — ABNORMAL HIGH (ref 11.5–15.5)
Smear Review: ADEQUATE
WBC: 22.2 10*3/uL — ABNORMAL HIGH (ref 4.0–10.5)
nRBC: 0.2 % (ref 0.0–0.2)

## 2022-02-16 LAB — COMPREHENSIVE METABOLIC PANEL
ALT: 17 U/L (ref 0–44)
AST: 25 U/L (ref 15–41)
Albumin: 3.9 g/dL (ref 3.5–5.0)
Alkaline Phosphatase: 144 U/L — ABNORMAL HIGH (ref 38–126)
Anion gap: 7 (ref 5–15)
BUN: 16 mg/dL (ref 8–23)
CO2: 26 mmol/L (ref 22–32)
Calcium: 8.9 mg/dL (ref 8.9–10.3)
Chloride: 105 mmol/L (ref 98–111)
Creatinine, Ser: 0.51 mg/dL (ref 0.44–1.00)
GFR, Estimated: >60 mL/min (ref >60)
Glucose, Bld: 92 mg/dL (ref 70–99)
Potassium: 4.3 mmol/L (ref 3.5–5.1)
Sodium: 138 mmol/L (ref 135–145)
Total Bilirubin: 0.6 mg/dL (ref 0.3–1.2)
Total Protein: 6.8 g/dL (ref 6.5–8.1)

## 2022-02-16 LAB — IRON AND TIBC
Iron: 41 ug/dL (ref 28–170)
Saturation Ratios: 9 % — ABNORMAL LOW (ref 10.4–31.8)
TIBC: 440 ug/dL (ref 250–450)
UIBC: 399 ug/dL

## 2022-02-16 LAB — FERRITIN: Ferritin: 19 ng/mL (ref 11–307)

## 2022-05-14 NOTE — Progress Notes (Unsigned)
[place]  Telephone:(336) [phone] Fax:(336) [phone]  ID: Jeryl Columbia OB: July 27, 1946  MR#: [id number]  MBW#:[id number]  Patient Care Team: Idelle Crouch, MD as PCP - General (Internal Medicine) Lloyd Huger, MD as Consulting Physician (Hematology and Oncology)   CHIEF COMPLAINT: JAK-2 positive, polycythemia vera, iron deficiency, heterozygous for hemochromatosis gene mutation H63D   INTERVAL HISTORY: Patient turns to clinic today for repeat laboratory work and routine 65-monthevaluation.  She fully recovered from her fractured right lower leg, but now has a fractured foot and is in a wheelchair today.  She otherwise feels well.  She continues to tolerate Hydrea without significant side effects. She has no neurologic complaints.  She has a good appetite and denies weight loss.  She denies any fevers, night sweats, or weight loss.  She denies any chest pain, shortness of breath, cough, or hemoptysis.  She denies any nausea, vomiting, constipation, or diarrhea. She has no urinary complaints.  Patient offers no further specific complaints today.  REVIEW OF SYSTEMS:   Review of Systems  Constitutional: Negative.  Negative for fever, malaise/fatigue and weight loss.  HENT: Negative.  Negative for congestion and sinus pain.   Respiratory: Negative.  Negative for cough and shortness of breath.   Cardiovascular: Negative.  Negative for chest pain and leg swelling.  Gastrointestinal: Negative.  Negative for abdominal pain, constipation, diarrhea, nausea and vomiting.  Genitourinary: Negative.  Negative for dysuria.  Musculoskeletal: Negative.  Negative for back pain.  Neurological: Negative.  Negative for dizziness, weakness and headaches.  Endo/Heme/Allergies:  Does not bruise/bleed easily.  Psychiatric/Behavioral: Negative.  The patient is not nervous/anxious and does not have insomnia.    As per HPI. Otherwise, a complete review of systems is  negative.  PAST MEDICAL HISTORY: Past Medical History:  Diagnosis Date   Abnormality of gait 03/19/2016   Hyperthyroidism    Polio    [place]     PAST SURGICAL HISTORY: Past Surgical History:  Procedure Laterality Date   BREAST BIOPSY Right 1999   EXCISIONAL - NEG   BREAST BIOPSY Right 2012   EXCISIONAL - NEG   BREAST SURGERY     breast biopsies   COLONOSCOPY WITH PROPOFOL N/A 07/04/2015   Procedure: COLONOSCOPY WITH PROPOFOL;  Surgeon: RManya Silvas MD;  [facility]  Service: Endoscopy;  Laterality: N/A;   ELECTROPHYSIOLOGIC STUDY N/A 12/16/2015   Procedure: SVT Ablation;  Surgeon: GEvans Lance MD;  [facility];  Service: Cardiovascular;  Laterality: N/A;   TIBIA IM NAIL INSERTION Right 09/09/2020   Procedure: INTRAMEDULLARY (IM) NAIL TIBIAL;  Surgeon: MHessie Knows MD;  Location: [facility];  Service: Orthopedics;  Laterality: Right;   TONSILLECTOMY      FAMILY HISTORY: Reviewed and unchanged. No reported history of malignancy or chronic disease.     ADVANCED DIRECTIVES:    HEALTH MAINTENANCE: Social History   Tobacco Use   Smoking status: Never   Smokeless tobacco: Never  Substance Use Topics   Alcohol use: No     Colonoscopy:  PAP:  Bone density:  Lipid panel:  Allergies  Allergen Reactions   Bee Venom Anaphylaxis   Caffeine     Fever, chills, withdrawal symptoms    Current Outpatient Medications  Medication Sig Dispense Refill   ARMOUR THYROID 60 MG tablet Take 90 mg by mouth daily.  4   aspirin EC 325 MG tablet Take 1 tablet (325 mg total) by mouth daily.  30 tablet 0   cholecalciferol (VITAMIN D) 1000 units tablet Take 1,000 Units by mouth daily.     Cyanocobalamin 1000 MCG TBCR Take 1,000 mcg by mouth daily.      Fish Oil-Cholecalciferol (FISH OIL + D3) 1000-1000 MG-UNIT CAPS Take 1,000 mg by mouth daily.      HYDROcodone-acetaminophen (NORCO/VICODIN) 5-325 MG tablet  hydrocodone 5 mg-acetaminophen 325 mg tablet  TAKE 1 TABLET BY MOUTH EVERY 4 HOURS AS NEEDED. (Patient not taking: Reported on 11/17/2021)     hydroxyurea (HYDREA) 500 MG capsule TAKE 1 CAPSULE BY MOUTH DAILY. MAY TAKE WITH FOOD TO MINIMIZE GASTROINTESTINAL SIDE EFFECTS. 90 capsule 3   L-Theanine 100 MG CAPS Take 100 mg by mouth at bedtime as needed.      Magnesium 200 MG TABS Take by mouth.     Probiotic Product (PROBIOTIC DAILY PO) Take 1 tablet by mouth daily.     Riboflavin 400 MG CAPS Take 400 mg by mouth daily.      tiZANidine (ZANAFLEX) 4 MG tablet tizanidine 4 mg tablet  TAKE 1 TABLET (4 MG TOTAL) BY MOUTH 3 (THREE) TIMES DAILY AS NEEDED FOR UP TO 10 DAYS (Patient not taking: Reported on 11/17/2021)     No current facility-administered medications for this visit.    OBJECTIVE: There were no vitals filed for this visit.    There is no height or weight on file to calculate BMI.    ECOG FS:0 - Asymptomatic   General: Well-developed, well-nourished, no acute distress.  Sitting in a wheelchair. Eyes: Pink conjunctiva, anicteric sclera. HEENT: Normocephalic, moist mucous membranes. Lungs: No audible wheezing or coughing. Heart: Regular rate and rhythm. Abdomen: Soft, nontender, no obvious distention. Musculoskeletal: No edema, cyanosis, or clubbing. Neuro: Alert, answering all questions appropriately. Cranial nerves grossly intact. Skin: No rashes or petechiae noted. Psych: Normal affect.  LAB RESULTS:  Lab Results  Component Value Date   NA 138 02/16/2022   K 4.3 02/16/2022   CL 105 02/16/2022   CO2 26 02/16/2022   GLUCOSE 92 02/16/2022   BUN 16 02/16/2022   CREATININE 0.51 02/16/2022   CALCIUM 8.9 02/16/2022   PROT 6.8 02/16/2022   ALBUMIN 3.9 02/16/2022   AST 25 02/16/2022   ALT 17 02/16/2022   ALKPHOS 144 (H) 02/16/2022   BILITOT 0.6 02/16/2022   GFRNONAA >60 02/16/2022   GFRAA >60 09/10/2020    Lab Results  Component Value Date   WBC 22.2 (H) 02/16/2022    NEUTROABS 18.2 (H) 02/16/2022   HGB 14.7 02/16/2022   HCT 48.4 (H) 02/16/2022   MCV 91.0 02/16/2022   PLT 349 02/16/2022   Lab Results  Component Value Date   IRON 41 02/16/2022   TIBC 440 02/16/2022   IRONPCTSAT 9 (L) 02/16/2022    Lab Results  Component Value Date   FERRITIN 19 02/16/2022     STUDIES: No results found.  ASSESSMENT: JAK-2 positive, polycythemia vera, iron deficiency, heterozygous for hemochromatosis gene mutation H63D   PLAN:    1. Polycythemia vera: JAK-2 mutation is positive. She does not require phlebotomy, but would consider in the future if necessary.  Patient's hemoglobin continues to be within normal limits at 12.8 today.  Continue 500 mg Hydrea daily. Her white blood cell count remains persistently elevated, but essentially unchanged.  No further intervention is needed.  Return to clinic in 3 months for laboratory work only and then in 6 months with repeat laboratory work and further evaluation. 2. Leukocytosis: Chronic and unchanged.  Previously, BCR/ABL was negative for underlying CML.  Repeat peripheral blood flow cytometry did not reveal any abnormality.  Can consider bone marrow biopsy in the future, but this is not necessary at this point.  Continue Hydrea as above. 3. Hemochromatosis: Patient is heterozygote.  Likely clinically insignificant.  Her most recent ferritin is 89. 4.  Iron deficiency: Resolved. 5.  Hyperbilirubinemia: Resolved. 6.  Fractured leg/foot: Continue follow-up with orthopedics as scheduled.  Patient reports her frequent fractures are likely secondary to her distant history of polio.  Patient expressed understanding and was in agreement with this plan. She also understands that She can call clinic at any time with any questions, concerns, or complaints.    Lloyd Huger, MD 05/14/22 10:21 AM

## 2022-05-18 ENCOUNTER — Inpatient Hospital Stay: Payer: Medicare Other | Attending: Oncology

## 2022-05-18 ENCOUNTER — Inpatient Hospital Stay (HOSPITAL_BASED_OUTPATIENT_CLINIC_OR_DEPARTMENT_OTHER): Payer: Medicare Other | Admitting: Oncology

## 2022-05-18 ENCOUNTER — Encounter: Payer: Self-pay | Admitting: Oncology

## 2022-05-18 VITALS — BP 82/52 | HR 88 | Temp 98.8°F | Resp 16 | Ht 67.0 in | Wt 137.8 lb

## 2022-05-18 DIAGNOSIS — D45 Polycythemia vera: Secondary | ICD-10-CM

## 2022-05-18 DIAGNOSIS — D72829 Elevated white blood cell count, unspecified: Secondary | ICD-10-CM | POA: Insufficient documentation

## 2022-05-18 LAB — IRON AND TIBC
Iron: 27 ug/dL — ABNORMAL LOW (ref 28–170)
Saturation Ratios: 6 % — ABNORMAL LOW (ref 10.4–31.8)
TIBC: 452 ug/dL — ABNORMAL HIGH (ref 250–450)
UIBC: 425 ug/dL

## 2022-05-18 LAB — COMPREHENSIVE METABOLIC PANEL
ALT: 20 U/L (ref 0–44)
AST: 27 U/L (ref 15–41)
Albumin: 3.9 g/dL (ref 3.5–5.0)
Alkaline Phosphatase: 154 U/L — ABNORMAL HIGH (ref 38–126)
Anion gap: 6 (ref 5–15)
BUN: 17 mg/dL (ref 8–23)
CO2: 26 mmol/L (ref 22–32)
Calcium: 8.6 mg/dL — ABNORMAL LOW (ref 8.9–10.3)
Chloride: 104 mmol/L (ref 98–111)
Creatinine, Ser: 0.47 mg/dL (ref 0.44–1.00)
GFR, Estimated: >60 mL/min (ref >60)
Glucose, Bld: 99 mg/dL (ref 70–99)
Potassium: 4.2 mmol/L (ref 3.5–5.1)
Sodium: 136 mmol/L (ref 135–145)
Total Bilirubin: 1 mg/dL (ref 0.3–1.2)
Total Protein: 7 g/dL (ref 6.5–8.1)

## 2022-05-18 LAB — CBC WITH DIFFERENTIAL/PLATELET
Abs Immature Granulocytes: 1.22 10*3/uL — ABNORMAL HIGH (ref 0.00–0.07)
Basophils Absolute: 0.3 10*3/uL — ABNORMAL HIGH (ref 0.0–0.1)
Basophils Relative: 1 %
Eosinophils Absolute: 0.4 10*3/uL (ref 0.0–0.5)
Eosinophils Relative: 1 %
HCT: 53 % — ABNORMAL HIGH (ref 36.0–46.0)
Hemoglobin: 15.6 g/dL — ABNORMAL HIGH (ref 12.0–15.0)
Immature Granulocytes: 5 %
Lymphocytes Relative: 7 %
Lymphs Abs: 2 10*3/uL (ref 0.7–4.0)
MCH: 24.4 pg — ABNORMAL LOW (ref 26.0–34.0)
MCHC: 29.4 g/dL — ABNORMAL LOW (ref 30.0–36.0)
MCV: 82.9 fL (ref 80.0–100.0)
Monocytes Absolute: 0.7 10*3/uL (ref 0.1–1.0)
Monocytes Relative: 3 %
Neutro Abs: 22.2 10*3/uL — ABNORMAL HIGH (ref 1.7–7.7)
Neutrophils Relative %: 83 %
Platelets: 356 10*3/uL (ref 150–400)
RBC: 6.39 MIL/uL — ABNORMAL HIGH (ref 3.87–5.11)
RDW: 21.8 % — ABNORMAL HIGH (ref 11.5–15.5)
Smear Review: ADEQUATE
WBC: 26.8 10*3/uL — ABNORMAL HIGH (ref 4.0–10.5)
nRBC: 0.2 % (ref 0.0–0.2)

## 2022-05-18 LAB — FERRITIN: Ferritin: 16 ng/mL (ref 11–307)

## 2022-08-18 ENCOUNTER — Inpatient Hospital Stay: Payer: Medicare Other | Attending: Oncology

## 2022-08-18 DIAGNOSIS — D45 Polycythemia vera: Secondary | ICD-10-CM | POA: Diagnosis present

## 2022-08-18 LAB — CBC WITH DIFFERENTIAL/PLATELET
Abs Immature Granulocytes: 0.92 10*3/uL — ABNORMAL HIGH (ref 0.00–0.07)
Basophils Absolute: 0.3 10*3/uL — ABNORMAL HIGH (ref 0.0–0.1)
Basophils Relative: 1 %
Eosinophils Absolute: 0.4 10*3/uL (ref 0.0–0.5)
Eosinophils Relative: 2 %
HCT: 52.8 % — ABNORMAL HIGH (ref 36.0–46.0)
Hemoglobin: 16 g/dL — ABNORMAL HIGH (ref 12.0–15.0)
Immature Granulocytes: 4 %
Lymphocytes Relative: 8 %
Lymphs Abs: 2 10*3/uL (ref 0.7–4.0)
MCH: 24.8 pg — ABNORMAL LOW (ref 26.0–34.0)
MCHC: 30.3 g/dL (ref 30.0–36.0)
MCV: 81.9 fL (ref 80.0–100.0)
Monocytes Absolute: 0.5 10*3/uL (ref 0.1–1.0)
Monocytes Relative: 2 %
Neutro Abs: 20 10*3/uL — ABNORMAL HIGH (ref 1.7–7.7)
Neutrophils Relative %: 83 %
Platelets: 301 10*3/uL (ref 150–400)
RBC: 6.45 MIL/uL — ABNORMAL HIGH (ref 3.87–5.11)
RDW: 23.3 % — ABNORMAL HIGH (ref 11.5–15.5)
Smear Review: NORMAL
WBC: 24.2 10*3/uL — ABNORMAL HIGH (ref 4.0–10.5)
nRBC: 0.2 % (ref 0.0–0.2)

## 2022-08-18 LAB — COMPREHENSIVE METABOLIC PANEL
ALT: 18 U/L (ref 0–44)
AST: 25 U/L (ref 15–41)
Albumin: 3.9 g/dL (ref 3.5–5.0)
Alkaline Phosphatase: 145 U/L — ABNORMAL HIGH (ref 38–126)
Anion gap: 7 (ref 5–15)
BUN: 17 mg/dL (ref 8–23)
CO2: 20 mmol/L — ABNORMAL LOW (ref 22–32)
Calcium: 8.5 mg/dL — ABNORMAL LOW (ref 8.9–10.3)
Chloride: 109 mmol/L (ref 98–111)
Creatinine, Ser: 0.46 mg/dL (ref 0.44–1.00)
GFR, Estimated: >60 mL/min (ref >60)
Glucose, Bld: 121 mg/dL — ABNORMAL HIGH (ref 70–99)
Potassium: 3.9 mmol/L (ref 3.5–5.1)
Sodium: 136 mmol/L (ref 135–145)
Total Bilirubin: 1 mg/dL (ref 0.3–1.2)
Total Protein: 6.6 g/dL (ref 6.5–8.1)

## 2022-08-18 LAB — IRON AND TIBC
Iron: 45 ug/dL (ref 28–170)
Saturation Ratios: 10 % — ABNORMAL LOW (ref 10.4–31.8)
TIBC: 447 ug/dL (ref 250–450)
UIBC: 402 ug/dL

## 2022-08-18 LAB — FERRITIN: Ferritin: 14 ng/mL (ref 11–307)

## 2022-11-18 ENCOUNTER — Inpatient Hospital Stay: Payer: Medicare Other | Attending: Oncology

## 2022-11-18 ENCOUNTER — Inpatient Hospital Stay (HOSPITAL_BASED_OUTPATIENT_CLINIC_OR_DEPARTMENT_OTHER): Payer: Medicare Other | Admitting: Oncology

## 2022-11-18 ENCOUNTER — Encounter: Payer: Self-pay | Admitting: Oncology

## 2022-11-18 VITALS — BP 121/69 | HR 89 | Temp 97.3°F | Resp 16 | Ht 67.0 in | Wt 132.0 lb

## 2022-11-18 DIAGNOSIS — D45 Polycythemia vera: Secondary | ICD-10-CM | POA: Insufficient documentation

## 2022-11-18 DIAGNOSIS — D72829 Elevated white blood cell count, unspecified: Secondary | ICD-10-CM | POA: Diagnosis not present

## 2022-11-18 LAB — IRON AND TIBC
Iron: 66 ug/dL (ref 28–170)
Saturation Ratios: 15 % (ref 10.4–31.8)
TIBC: 437 ug/dL (ref 250–450)
UIBC: 371 ug/dL

## 2022-11-18 LAB — COMPREHENSIVE METABOLIC PANEL
ALT: 24 U/L (ref 0–44)
AST: 29 U/L (ref 15–41)
Albumin: 4 g/dL (ref 3.5–5.0)
Alkaline Phosphatase: 159 U/L — ABNORMAL HIGH (ref 38–126)
Anion gap: 9 (ref 5–15)
BUN: 20 mg/dL (ref 8–23)
CO2: 25 mmol/L (ref 22–32)
Calcium: 8.7 mg/dL — ABNORMAL LOW (ref 8.9–10.3)
Chloride: 104 mmol/L (ref 98–111)
Creatinine, Ser: 0.51 mg/dL (ref 0.44–1.00)
GFR, Estimated: >60 mL/min (ref >60)
Glucose, Bld: 166 mg/dL — ABNORMAL HIGH (ref 70–99)
Potassium: 3.9 mmol/L (ref 3.5–5.1)
Sodium: 138 mmol/L (ref 135–145)
Total Bilirubin: 1.3 mg/dL — ABNORMAL HIGH (ref 0.3–1.2)
Total Protein: 6.6 g/dL (ref 6.5–8.1)

## 2022-11-18 LAB — FERRITIN: Ferritin: 15 ng/mL (ref 11–307)

## 2022-11-18 LAB — CBC WITH DIFFERENTIAL/PLATELET
Abs Immature Granulocytes: 1.25 10*3/uL — ABNORMAL HIGH (ref 0.00–0.07)
Basophils Absolute: 0.3 10*3/uL — ABNORMAL HIGH (ref 0.0–0.1)
Basophils Relative: 2 %
Eosinophils Absolute: 0.3 10*3/uL (ref 0.0–0.5)
Eosinophils Relative: 1 %
HCT: 53.9 % — ABNORMAL HIGH (ref 36.0–46.0)
Hemoglobin: 16.2 g/dL — ABNORMAL HIGH (ref 12.0–15.0)
Immature Granulocytes: 6 %
Lymphocytes Relative: 8 %
Lymphs Abs: 1.8 10*3/uL (ref 0.7–4.0)
MCH: 26.1 pg (ref 26.0–34.0)
MCHC: 30.1 g/dL (ref 30.0–36.0)
MCV: 86.8 fL (ref 80.0–100.0)
Monocytes Absolute: 0.6 10*3/uL (ref 0.1–1.0)
Monocytes Relative: 3 %
Neutro Abs: 16.6 10*3/uL — ABNORMAL HIGH (ref 1.7–7.7)
Neutrophils Relative %: 80 %
Platelets: 281 10*3/uL (ref 150–400)
RBC: 6.21 MIL/uL — ABNORMAL HIGH (ref 3.87–5.11)
RDW: 23.7 % — ABNORMAL HIGH (ref 11.5–15.5)
Smear Review: NORMAL
WBC: 20.8 10*3/uL — ABNORMAL HIGH (ref 4.0–10.5)
nRBC: 0.2 % (ref 0.0–0.2)

## 2022-11-18 NOTE — Progress Notes (Signed)
[place]  Telephone:(336) [phone] Fax:(336) [phone]  ID: Jeryl Columbia OB: Jul 01, 1946  MR#: [id number]  HRC#:[id number]  Patient Care Team: Idelle Crouch, MD as PCP - General (Internal Medicine) Lloyd Huger, MD as Consulting Physician (Hematology and Oncology)   CHIEF COMPLAINT: JAK-2 positive, polycythemia vera, iron deficiency, heterozygous for hemochromatosis gene mutation H63D   INTERVAL HISTORY: Patient returns to clinic today for repeat laboratory work and routine 46-monthevaluation.  She continues to have chronic weakness and fatigue in her lower extremities stemming from her bilateral leg fractures over a year ago.  She does not report any further falls.  She otherwise feels well.  She continues to tolerate Hydrea without significant side effects. She has no neurologic complaints.  She has a good appetite and denies weight loss.  She denies any fevers, night sweats, or weight loss.  She denies any chest pain, shortness of breath, cough, or hemoptysis.  She denies any nausea, vomiting, constipation, or diarrhea. She has no urinary complaints.  Patient offers no further specific complaints today.  REVIEW OF SYSTEMS:   Review of Systems  Constitutional: Negative.  Negative for fever, malaise/fatigue and weight loss.  HENT: Negative.  Negative for congestion and sinus pain.   Respiratory: Negative.  Negative for cough and shortness of breath.   Cardiovascular: Negative.  Negative for chest pain and leg swelling.  Gastrointestinal: Negative.  Negative for abdominal pain, constipation, diarrhea, nausea and vomiting.  Genitourinary:  Positive for urgency. Negative for dysuria.  Musculoskeletal: Negative.  Negative for back pain.  Neurological:  Positive for weakness. Negative for dizziness and headaches.  Endo/Heme/Allergies:  Does not bruise/bleed easily.  Psychiatric/Behavioral: Negative.  The patient is not nervous/anxious and does not have  insomnia.     As per HPI. Otherwise, a complete review of systems is negative.  PAST MEDICAL HISTORY: Past Medical History:  Diagnosis Date   Abnormality of gait 03/19/2016   Hyperthyroidism    Polio    PSVT (paroxysmal supraventricular tachycardia)     PAST SURGICAL HISTORY: Past Surgical History:  Procedure Laterality Date   BREAST BIOPSY Right 1999   EXCISIONAL - NEG   BREAST BIOPSY Right 2012   EXCISIONAL - NEG   BREAST SURGERY     breast biopsies   COLONOSCOPY WITH PROPOFOL N/A 07/04/2015   Procedure: COLONOSCOPY WITH PROPOFOL;  Surgeon: RManya Silvas MD;  [facility]  Service: Endoscopy;  Laterality: N/A;   ELECTROPHYSIOLOGIC STUDY N/A 12/16/2015   Procedure: SVT Ablation;  Surgeon: GEvans Lance MD;  [facility];  Service: Cardiovascular;  Laterality: N/A;   TIBIA IM NAIL INSERTION Right 09/09/2020   Procedure: INTRAMEDULLARY (IM) NAIL TIBIAL;  Surgeon: MHessie Knows MD;  Location: [facility];  Service: Orthopedics;  Laterality: Right;   TONSILLECTOMY      FAMILY HISTORY: Reviewed and unchanged. No reported history of malignancy or chronic disease.     ADVANCED DIRECTIVES:    HEALTH MAINTENANCE: Social History   Tobacco Use   Smoking status: Never   Smokeless tobacco: Never  Substance Use Topics   Alcohol use: No     Colonoscopy:  PAP:  Bone density:  Lipid panel:  Allergies  Allergen Reactions   Bee Venom Anaphylaxis   Caffeine     Fever, chills, withdrawal symptoms    Current Outpatient Medications  Medication Sig Dispense Refill   ARMOUR THYROID 60 MG tablet Take 75 mg by mouth daily.  4   aspirin EC  325 MG tablet Take 1 tablet (325 mg total) by mouth daily. 30 tablet 0   cholecalciferol (VITAMIN D) 1000 units tablet Take 1,000 Units by mouth daily.     Cyanocobalamin 1000 MCG TBCR Take 1,000 mcg by mouth daily.      EPINEPHrine 0.3 mg/0.3 mL IJ SOAJ injection Inject into the muscle.     Fish  Oil-Cholecalciferol (FISH OIL + D3) 1000-1000 MG-UNIT CAPS Take 1,000 mg by mouth daily.      HYDROcodone-acetaminophen (NORCO/VICODIN) 5-325 MG tablet      hydroxyurea (HYDREA) 500 MG capsule TAKE 1 CAPSULE BY MOUTH DAILY. MAY TAKE WITH FOOD TO MINIMIZE GASTROINTESTINAL SIDE EFFECTS. 90 capsule 3   L-Theanine 100 MG CAPS Take 100 mg by mouth at bedtime as needed.      Magnesium 200 MG TABS Take by mouth.     Probiotic Product (PROBIOTIC DAILY PO) Take 1 tablet by mouth daily.     Riboflavin 400 MG CAPS Take 400 mg by mouth daily.      tiZANidine (ZANAFLEX) 4 MG tablet tizanidine 4 mg tablet  TAKE 1 TABLET (4 MG TOTAL) BY MOUTH 3 (THREE) TIMES DAILY AS NEEDED FOR UP TO 10 DAYS (Patient not taking: Reported on 11/17/2021)     No current facility-administered medications for this visit.    OBJECTIVE: Vitals:   11/18/22 1421  BP: 121/69  Pulse: 89  Resp: 16  Temp: (!) 97.3 F (36.3 C)  SpO2: 97%     Body mass index is 20.67 kg/m.    ECOG FS:1 - Symptomatic but completely ambulatory   General: Well-developed, well-nourished, no acute distress.  Sitting in a wheelchair. Eyes: Pink conjunctiva, anicteric sclera. HEENT: Normocephalic, moist mucous membranes. Lungs: No audible wheezing or coughing. Heart: Regular rate and rhythm. Abdomen: Soft, nontender, no obvious distention. Musculoskeletal: No edema, cyanosis, or clubbing. Neuro: Alert, answering all questions appropriately. Cranial nerves grossly intact. Skin: No rashes or petechiae noted. Psych: Normal affect.  LAB RESULTS:  Lab Results  Component Value Date   NA 138 11/18/2022   K 3.9 11/18/2022   CL 104 11/18/2022   CO2 25 11/18/2022   GLUCOSE 166 (H) 11/18/2022   BUN 20 11/18/2022   CREATININE 0.51 11/18/2022   CALCIUM 8.7 (L) 11/18/2022   PROT 6.6 11/18/2022   ALBUMIN 4.0 11/18/2022   AST 29 11/18/2022   ALT 24 11/18/2022   ALKPHOS 159 (H) 11/18/2022   BILITOT 1.3 (H) 11/18/2022   GFRNONAA >60 11/18/2022    GFRAA >60 09/10/2020    Lab Results  Component Value Date   WBC 20.8 (H) 11/18/2022   NEUTROABS PENDING 11/18/2022   HGB 16.2 (H) 11/18/2022   HCT 53.9 (H) 11/18/2022   MCV 86.8 11/18/2022   PLT 281 11/18/2022   Lab Results  Component Value Date   IRON 45 08/18/2022   TIBC 447 08/18/2022   IRONPCTSAT 10 (L) 08/18/2022    Lab Results  Component Value Date   FERRITIN 14 08/18/2022     STUDIES: No results found.  ASSESSMENT: JAK-2 positive, polycythemia vera, iron deficiency, heterozygous for hemochromatosis gene mutation H63D   PLAN:    1. Polycythemia vera: JAK-2 mutation is positive. She does not require phlebotomy, but would consider in the future if necessary.  Patient's hemoglobin has once again trended up slightly to 16.2.  After discussion with the patient, plan is to keep her current Hydrea dose of 500 mg daily at the same, but may need to increase her dose if  hemoglobin continues to trend up.  She does not require phlebotomy at this time.  Return to clinic in 3 months for laboratory work only and then in 6 months for laboratory work and further evaluation.   2. Leukocytosis: Chronic and unchanged.  Previously, BCR/ABL was negative for underlying CML.  Repeat peripheral blood flow cytometry did not reveal any abnormality.  Can consider bone marrow biopsy in the future, but this is not necessary at this point.  Continue Hydrea as above. 3. Hemochromatosis: Patient is heterozygote.  Likely clinically insignificant.  Her most recent ferritin is 15. 4.  Iron deficiency: Resolved. 5.  Hyperbilirubinemia: Mild.  Patient's total bilirubin is 1.3. 6.  Fractured leg/foot: Resolved.  Continue follow-up with orthopedics as scheduled.  Patient reports her fractures are likely secondary to her distant history of polio.  I spent a total of 30 minutes reviewing chart data, face-to-face evaluation with the patient, counseling and coordination of care as detailed above.   Patient  expressed understanding and was in agreement with this plan. She also understands that She can call clinic at any time with any questions, concerns, or complaints.    Lloyd Huger, MD 11/18/22 2:47 PM

## 2022-11-19 ENCOUNTER — Encounter: Payer: Self-pay | Admitting: Oncology

## 2022-12-26 ENCOUNTER — Other Ambulatory Visit: Payer: Self-pay | Admitting: Oncology

## 2022-12-27 ENCOUNTER — Encounter: Payer: Self-pay | Admitting: Oncology

## 2023-02-17 ENCOUNTER — Inpatient Hospital Stay: Payer: Medicare Other | Attending: Oncology

## 2023-02-17 DIAGNOSIS — D45 Polycythemia vera: Secondary | ICD-10-CM | POA: Diagnosis not present

## 2023-02-17 LAB — CBC WITH DIFFERENTIAL/PLATELET
Abs Immature Granulocytes: 1.83 10*3/uL — ABNORMAL HIGH (ref 0.00–0.07)
Basophils Absolute: 0.4 10*3/uL — ABNORMAL HIGH (ref 0.0–0.1)
Basophils Relative: 2 %
Eosinophils Absolute: 0.2 10*3/uL (ref 0.0–0.5)
Eosinophils Relative: 1 %
HCT: 39.1 % (ref 36.0–46.0)
Hemoglobin: 12.3 g/dL (ref 12.0–15.0)
Immature Granulocytes: 9 %
Lymphocytes Relative: 8 %
Lymphs Abs: 1.6 10*3/uL (ref 0.7–4.0)
MCH: 29.8 pg (ref 26.0–34.0)
MCHC: 31.5 g/dL (ref 30.0–36.0)
MCV: 94.7 fL (ref 80.0–100.0)
Monocytes Absolute: 0.9 10*3/uL (ref 0.1–1.0)
Monocytes Relative: 5 %
Neutro Abs: 14.7 10*3/uL — ABNORMAL HIGH (ref 1.7–7.7)
Neutrophils Relative %: 75 %
Platelets: 296 10*3/uL (ref 150–400)
RBC: 4.13 MIL/uL (ref 3.87–5.11)
RDW: 22.3 % — ABNORMAL HIGH (ref 11.5–15.5)
Smear Review: NORMAL
WBC: 19.7 10*3/uL — ABNORMAL HIGH (ref 4.0–10.5)
nRBC: 0.4 % — ABNORMAL HIGH (ref 0.0–0.2)

## 2023-02-17 LAB — COMPREHENSIVE METABOLIC PANEL
ALT: 24 U/L (ref 0–44)
AST: 33 U/L (ref 15–41)
Albumin: 4.3 g/dL (ref 3.5–5.0)
Alkaline Phosphatase: 141 U/L — ABNORMAL HIGH (ref 38–126)
Anion gap: 10 (ref 5–15)
BUN: 21 mg/dL (ref 8–23)
CO2: 24 mmol/L (ref 22–32)
Calcium: 8.9 mg/dL (ref 8.9–10.3)
Chloride: 103 mmol/L (ref 98–111)
Creatinine, Ser: 0.45 mg/dL (ref 0.44–1.00)
GFR, Estimated: >60 mL/min (ref >60)
Glucose, Bld: 87 mg/dL (ref 70–99)
Potassium: 4.4 mmol/L (ref 3.5–5.1)
Sodium: 137 mmol/L (ref 135–145)
Total Bilirubin: 1.2 mg/dL (ref 0.3–1.2)
Total Protein: 6.9 g/dL (ref 6.5–8.1)

## 2023-02-17 LAB — FERRITIN: Ferritin: 227 ng/mL (ref 11–307)

## 2023-02-17 LAB — IRON AND TIBC
Iron: 106 ug/dL (ref 28–170)
Saturation Ratios: 34 % — ABNORMAL HIGH (ref 10.4–31.8)
TIBC: 314 ug/dL (ref 250–450)
UIBC: 208 ug/dL

## 2023-05-19 ENCOUNTER — Inpatient Hospital Stay (HOSPITAL_BASED_OUTPATIENT_CLINIC_OR_DEPARTMENT_OTHER): Payer: Medicare Other | Admitting: Oncology

## 2023-05-19 ENCOUNTER — Encounter: Payer: Self-pay | Admitting: Oncology

## 2023-05-19 ENCOUNTER — Inpatient Hospital Stay: Payer: Medicare Other | Attending: Oncology

## 2023-05-19 ENCOUNTER — Other Ambulatory Visit: Payer: Self-pay

## 2023-05-19 VITALS — BP 112/65 | HR 106 | Temp 98.4°F | Resp 16 | Ht 67.0 in | Wt 125.8 lb

## 2023-05-19 DIAGNOSIS — Z7982 Long term (current) use of aspirin: Secondary | ICD-10-CM | POA: Diagnosis not present

## 2023-05-19 DIAGNOSIS — E039 Hypothyroidism, unspecified: Secondary | ICD-10-CM

## 2023-05-19 DIAGNOSIS — E782 Mixed hyperlipidemia: Secondary | ICD-10-CM

## 2023-05-19 DIAGNOSIS — E559 Vitamin D deficiency, unspecified: Secondary | ICD-10-CM

## 2023-05-19 DIAGNOSIS — R531 Weakness: Secondary | ICD-10-CM | POA: Insufficient documentation

## 2023-05-19 DIAGNOSIS — D45 Polycythemia vera: Secondary | ICD-10-CM

## 2023-05-19 DIAGNOSIS — D72829 Elevated white blood cell count, unspecified: Secondary | ICD-10-CM | POA: Insufficient documentation

## 2023-05-19 DIAGNOSIS — Z79890 Hormone replacement therapy (postmenopausal): Secondary | ICD-10-CM | POA: Insufficient documentation

## 2023-05-19 DIAGNOSIS — R5383 Other fatigue: Secondary | ICD-10-CM | POA: Diagnosis not present

## 2023-05-19 LAB — CBC WITH DIFFERENTIAL/PLATELET
Abs Immature Granulocytes: 1.31 10*3/uL — ABNORMAL HIGH (ref 0.00–0.07)
Basophils Absolute: 0.2 10*3/uL — ABNORMAL HIGH (ref 0.0–0.1)
Basophils Relative: 2 %
Eosinophils Absolute: 0.2 10*3/uL (ref 0.0–0.5)
Eosinophils Relative: 1 %
HCT: 36.5 % (ref 36.0–46.0)
Hemoglobin: 11.6 g/dL — ABNORMAL LOW (ref 12.0–15.0)
Immature Granulocytes: 9 %
Lymphocytes Relative: 10 %
Lymphs Abs: 1.6 10*3/uL (ref 0.7–4.0)
MCH: 30.2 pg (ref 26.0–34.0)
MCHC: 31.8 g/dL (ref 30.0–36.0)
MCV: 95.1 fL (ref 80.0–100.0)
Monocytes Absolute: 0.6 10*3/uL (ref 0.1–1.0)
Monocytes Relative: 4 %
Neutro Abs: 11.2 10*3/uL — ABNORMAL HIGH (ref 1.7–7.7)
Neutrophils Relative %: 74 %
Platelets: 315 10*3/uL (ref 150–400)
RBC: 3.84 MIL/uL — ABNORMAL LOW (ref 3.87–5.11)
RDW: 19.9 % — ABNORMAL HIGH (ref 11.5–15.5)
Smear Review: ADEQUATE
WBC: 15.1 10*3/uL — ABNORMAL HIGH (ref 4.0–10.5)
nRBC: 0.3 % — ABNORMAL HIGH (ref 0.0–0.2)

## 2023-05-19 LAB — URINALYSIS, COMPLETE (UACMP) WITH MICROSCOPIC
Bilirubin Urine: NEGATIVE
Glucose, UA: NEGATIVE mg/dL
Hgb urine dipstick: NEGATIVE
Ketones, ur: NEGATIVE mg/dL
Nitrite: NEGATIVE
Protein, ur: NEGATIVE mg/dL
Specific Gravity, Urine: 1.015 (ref 1.005–1.030)
Squamous Epithelial / HPF: NONE SEEN /HPF (ref 0–5)
pH: 5 (ref 5.0–8.0)

## 2023-05-19 LAB — COMPREHENSIVE METABOLIC PANEL
ALT: 16 U/L (ref 0–44)
AST: 26 U/L (ref 15–41)
Albumin: 4 g/dL (ref 3.5–5.0)
Alkaline Phosphatase: 104 U/L (ref 38–126)
Anion gap: 9 (ref 5–15)
BUN: 18 mg/dL (ref 8–23)
CO2: 22 mmol/L (ref 22–32)
Calcium: 8.8 mg/dL — ABNORMAL LOW (ref 8.9–10.3)
Chloride: 104 mmol/L (ref 98–111)
Creatinine, Ser: 0.52 mg/dL (ref 0.44–1.00)
GFR, Estimated: >60 mL/min (ref >60)
Glucose, Bld: 169 mg/dL — ABNORMAL HIGH (ref 70–99)
Potassium: 3.9 mmol/L (ref 3.5–5.1)
Sodium: 135 mmol/L (ref 135–145)
Total Bilirubin: 1.1 mg/dL (ref 0.3–1.2)
Total Protein: 6.9 g/dL (ref 6.5–8.1)

## 2023-05-19 LAB — IRON AND TIBC
Iron: 54 ug/dL (ref 28–170)
Saturation Ratios: 18 % (ref 10.4–31.8)
TIBC: 294 ug/dL (ref 250–450)
UIBC: 240 ug/dL

## 2023-05-19 LAB — LIPID PANEL
Cholesterol: 125 mg/dL (ref 0–200)
HDL: 26 mg/dL — ABNORMAL LOW (ref >40)
LDL Cholesterol: 57 mg/dL (ref 0–99)
Total CHOL/HDL Ratio: 4.8 RATIO
Triglycerides: 212 mg/dL — ABNORMAL HIGH (ref <150)
VLDL: 42 mg/dL — ABNORMAL HIGH (ref 0–40)

## 2023-05-19 LAB — TSH: TSH: 1.522 u[IU]/mL (ref 0.350–4.500)

## 2023-05-19 LAB — FERRITIN: Ferritin: 167 ng/mL (ref 11–307)

## 2023-05-19 NOTE — Progress Notes (Signed)
[facility]  Telephone:(336) [phone] Fax:(336) [phone]  ID: Priscilla Berry OB: 1946/05/01  MR#: [id number]  AOZ#:[id number]  Patient Care Team: Marguarite Arbour, MD as PCP - General (Internal Medicine) Jeralyn Ruths, MD as Consulting Physician (Hematology and Oncology)   CHIEF COMPLAINT: JAK-2 positive, polycythemia vera, iron deficiency, heterozygous for hemochromatosis gene mutation H63D   INTERVAL HISTORY: Patient returns to clinic today for repeat laboratory work and further evaluation.  She has chronic weakness and fatigue, but otherwise feels well.  She does not report any further falls.  She otherwise feels well.  She continues to tolerate Hydrea without significant side effects. She has no neurologic complaints.  She has a good appetite and denies weight loss.  She denies any fevers, night sweats, or weight loss.  She denies any chest pain, shortness of breath, cough, or hemoptysis.  She denies any nausea, vomiting, constipation, or diarrhea. She has no urinary complaints.  Patient offers no further specific complaints today.  REVIEW OF SYSTEMS:   Review of Systems  Constitutional:  Positive for malaise/fatigue. Negative for fever and weight loss.  HENT: Negative.  Negative for congestion and sinus pain.   Respiratory: Negative.  Negative for cough and shortness of breath.   Cardiovascular: Negative.  Negative for chest pain and leg swelling.  Gastrointestinal: Negative.  Negative for abdominal pain, constipation, diarrhea, nausea and vomiting.  Genitourinary: Negative.  Negative for dysuria and urgency.  Musculoskeletal: Negative.  Negative for back pain.  Neurological:  Positive for weakness. Negative for dizziness and headaches.  Endo/Heme/Allergies:  Does not bruise/bleed easily.  Psychiatric/Behavioral: Negative.  The patient is not nervous/anxious and does not have insomnia.     As per HPI. Otherwise, a complete review of systems is  negative.  PAST MEDICAL HISTORY: Past Medical History:  Diagnosis Date   Abnormality of gait 03/19/2016   Hyperthyroidism    Polio    PSVT (paroxysmal supraventricular tachycardia)     PAST SURGICAL HISTORY: Past Surgical History:  Procedure Laterality Date   BREAST BIOPSY Right 1999   EXCISIONAL - NEG   BREAST BIOPSY Right 2012   EXCISIONAL - NEG   BREAST SURGERY     breast biopsies   COLONOSCOPY WITH PROPOFOL N/A 07/04/2015   Procedure: COLONOSCOPY WITH PROPOFOL;  Surgeon: Scot Jun, MD;  Location: [facility] END[facility];  Service: Endoscopy;  Laterality: N/A;   ELECTROPHYSIOLOGIC STUDY N/A 12/16/2015   Procedure: SVT Ablation;  Surgeon: Marinus Maw, MD;  Location: [facility] INVASIVE CV LAB;  Service: Cardiovascular;  Laterality: N/A;   TIBIA IM NAIL INSERTION Right 09/09/2020   Procedure: INTRAMEDULLARY (IM) NAIL TIBIAL;  Surgeon: Kennedy Bucker, MD;  Location: [facility] ORS;  Service: Orthopedics;  Laterality: Right;   TONSILLECTOMY      FAMILY HISTORY: Reviewed and unchanged. No reported history of malignancy or chronic disease.     ADVANCED DIRECTIVES:    HEALTH MAINTENANCE: Social History   Tobacco Use   Smoking status: Never   Smokeless tobacco: Never  Substance Use Topics   Alcohol use: No     Colonoscopy:  PAP:  Bone density:  Lipid panel:  Allergies  Allergen Reactions   Bee Venom Anaphylaxis   Caffeine     Fever, chills, withdrawal symptoms    Current Outpatient Medications  Medication Sig Dispense Refill   ARMOUR THYROID 60 MG tablet Take 75 mg by mouth daily.  4   aspirin EC 325 MG tablet Take 1 tablet (325 mg total) by mouth daily.  30 tablet 0   cholecalciferol (VITAMIN D) 1000 units tablet Take 1,000 Units by mouth daily.     Cyanocobalamin 1000 MCG TBCR Take 1,000 mcg by mouth daily.      EPINEPHrine 0.3 mg/0.3 mL IJ SOAJ injection Inject into the muscle.     Fish Oil-Cholecalciferol (FISH OIL + D3) 1000-1000 MG-UNIT CAPS Take 1,000 mg by mouth  daily.      HYDROcodone-acetaminophen (NORCO/VICODIN) 5-325 MG tablet      hydroxyurea (HYDREA) 500 MG capsule TAKE 1 CAPSULE BY MOUTH DAILY. MAY TAKE WITH FOOD TO MINIMIZE GASTROINTESTINAL SIDE EFFECTS. 90 capsule 3   L-Theanine 100 MG CAPS Take 100 mg by mouth at bedtime as needed.      Magnesium 200 MG TABS Take by mouth.     Probiotic Product (PROBIOTIC DAILY PO) Take 1 tablet by mouth daily.     Riboflavin 400 MG CAPS Take 400 mg by mouth daily.      No current facility-administered medications for this visit.    OBJECTIVE: Vitals:   05/19/23 1420  BP: 112/65  Pulse: (!) 106  Resp: 16  Temp: 98.4 F (36.9 C)  SpO2: 98%     Body mass index is 19.7 kg/m.    ECOG FS:1 - Symptomatic but completely ambulatory   General: Well-developed, well-nourished, no acute distress.  Sitting in a wheelchair. Eyes: Pink conjunctiva, anicteric sclera. HEENT: Normocephalic, moist mucous membranes. Lungs: No audible wheezing or coughing. Heart: Regular rate and rhythm. Abdomen: Soft, nontender, no obvious distention. Musculoskeletal: No edema, cyanosis, or clubbing. Neuro: Alert, answering all questions appropriately. Cranial nerves grossly intact. Skin: No rashes or petechiae noted. Psych: Normal affect.  LAB RESULTS:  Lab Results  Component Value Date   NA 135 05/19/2023   K 3.9 05/19/2023   CL 104 05/19/2023   CO2 22 05/19/2023   GLUCOSE 169 (H) 05/19/2023   BUN 18 05/19/2023   CREATININE 0.52 05/19/2023   CALCIUM 8.8 (L) 05/19/2023   PROT 6.9 05/19/2023   ALBUMIN 4.0 05/19/2023   AST 26 05/19/2023   ALT 16 05/19/2023   ALKPHOS 104 05/19/2023   BILITOT 1.1 05/19/2023   GFRNONAA >60 05/19/2023   GFRAA >60 09/10/2020    Lab Results  Component Value Date   WBC 15.1 (H) 05/19/2023   NEUTROABS 11.2 (H) 05/19/2023   HGB 11.6 (L) 05/19/2023   HCT 36.5 05/19/2023   MCV 95.1 05/19/2023   PLT 315 05/19/2023   Lab Results  Component Value Date   IRON 54 05/19/2023   TIBC  294 05/19/2023   IRONPCTSAT 18 05/19/2023    Lab Results  Component Value Date   FERRITIN 167 05/19/2023     STUDIES: No results found.  ASSESSMENT: JAK-2 positive, polycythemia vera, iron deficiency, heterozygous for hemochromatosis gene mutation H63D   PLAN:    1. Polycythemia vera: JAK-2 mutation is positive. She does not require phlebotomy, but would consider in the future if necessary.  Patient's hemoglobin has declined to 11.2.  She has been instructed to hold her Hydrea.  Will repeat laboratory work in 6 weeks and then patient will follow-up in 3 months with repeat laboratory work and further evaluation.  Will continue to hold Hydrea unless patient's hemoglobin begins to increase.   2. Leukocytosis: Chronic and unchanged.  Previously, BCR/ABL was negative for underlying CML.  Repeat peripheral blood flow cytometry did not reveal any abnormality.  Can consider bone marrow biopsy in the future, but this is not necessary at this point.  Continue Hydrea as above. 3. Hemochromatosis: Patient is heterozygote.  Likely clinically insignificant.  Her most recent ferritin is 15. 4.  Iron deficiency: Resolved. 5.  Hyperbilirubinemia: Resolved. 6.  Fractured leg/foot: Resolved. Patient reports her fractures are likely secondary to her distant history of polio.  I spent a total of 30 minutes reviewing chart data, face-to-face evaluation with the patient, counseling and coordination of care as detailed above.    Patient expressed understanding and was in agreement with this plan. She also understands that She can call clinic at any time with any questions, concerns, or complaints.    Jeralyn Ruths, MD 05/20/23 1:13 PM

## 2023-05-20 ENCOUNTER — Encounter: Payer: Self-pay | Admitting: Oncology

## 2023-05-21 LAB — MISC LABCORP TEST (SEND OUT): Labcorp test code: 81950

## 2023-06-30 ENCOUNTER — Other Ambulatory Visit: Payer: Self-pay

## 2023-06-30 ENCOUNTER — Inpatient Hospital Stay: Payer: Medicare Other | Attending: Oncology

## 2023-06-30 ENCOUNTER — Encounter: Payer: Self-pay | Admitting: Oncology

## 2023-06-30 ENCOUNTER — Telehealth: Payer: Self-pay | Admitting: *Deleted

## 2023-06-30 DIAGNOSIS — D45 Polycythemia vera: Secondary | ICD-10-CM

## 2023-06-30 DIAGNOSIS — E782 Mixed hyperlipidemia: Secondary | ICD-10-CM

## 2023-06-30 DIAGNOSIS — D72829 Elevated white blood cell count, unspecified: Secondary | ICD-10-CM

## 2023-06-30 DIAGNOSIS — E559 Vitamin D deficiency, unspecified: Secondary | ICD-10-CM

## 2023-06-30 DIAGNOSIS — Z79899 Other long term (current) drug therapy: Secondary | ICD-10-CM | POA: Insufficient documentation

## 2023-06-30 LAB — CMP (CANCER CENTER ONLY)
ALT: 17 U/L (ref 0–44)
AST: 32 U/L (ref 15–41)
Albumin: 3.9 g/dL (ref 3.5–5.0)
Alkaline Phosphatase: 122 U/L (ref 38–126)
Anion gap: 10 (ref 5–15)
BUN: 23 mg/dL (ref 8–23)
CO2: 22 mmol/L (ref 22–32)
Calcium: 8.6 mg/dL — ABNORMAL LOW (ref 8.9–10.3)
Chloride: 104 mmol/L (ref 98–111)
Creatinine: 0.49 mg/dL (ref 0.44–1.00)
GFR, Estimated: >60 mL/min (ref >60)
Glucose, Bld: 89 mg/dL (ref 70–99)
Potassium: 4.1 mmol/L (ref 3.5–5.1)
Sodium: 136 mmol/L (ref 135–145)
Total Bilirubin: 1 mg/dL (ref 0.3–1.2)
Total Protein: 6.9 g/dL (ref 6.5–8.1)

## 2023-06-30 LAB — IRON AND TIBC
Iron: 51 ug/dL (ref 28–170)
Saturation Ratios: 17 % (ref 10.4–31.8)
TIBC: 308 ug/dL (ref 250–450)
UIBC: 257 ug/dL

## 2023-06-30 LAB — CBC WITH DIFFERENTIAL (CANCER CENTER ONLY)
Abs Immature Granulocytes: 1.93 10*3/uL — ABNORMAL HIGH (ref 0.00–0.07)
Basophils Absolute: 0.4 10*3/uL — ABNORMAL HIGH (ref 0.0–0.1)
Basophils Relative: 2 %
Eosinophils Absolute: 0.2 10*3/uL (ref 0.0–0.5)
Eosinophils Relative: 1 %
HCT: 36.5 % (ref 36.0–46.0)
Hemoglobin: 11.3 g/dL — ABNORMAL LOW (ref 12.0–15.0)
Immature Granulocytes: 12 %
Lymphocytes Relative: 13 %
Lymphs Abs: 2 10*3/uL (ref 0.7–4.0)
MCH: 28.3 pg (ref 26.0–34.0)
MCHC: 31 g/dL (ref 30.0–36.0)
MCV: 91.3 fL (ref 80.0–100.0)
Monocytes Absolute: 0.7 10*3/uL (ref 0.1–1.0)
Monocytes Relative: 5 %
Neutro Abs: 10.7 10*3/uL — ABNORMAL HIGH (ref 1.7–7.7)
Neutrophils Relative %: 67 %
Platelet Count: 384 10*3/uL (ref 150–400)
RBC: 4 MIL/uL (ref 3.87–5.11)
RDW: 18.8 % — ABNORMAL HIGH (ref 11.5–15.5)
Smear Review: NORMAL
WBC Count: 15.9 10*3/uL — ABNORMAL HIGH (ref 4.0–10.5)
nRBC: 0.9 % — ABNORMAL HIGH (ref 0.0–0.2)

## 2023-06-30 LAB — LIPID PANEL
Cholesterol: 118 mg/dL (ref 0–200)
HDL: 19 mg/dL — ABNORMAL LOW (ref >40)
LDL Cholesterol: 65 mg/dL (ref 0–99)
Total CHOL/HDL Ratio: 6.2 RATIO
Triglycerides: 171 mg/dL — ABNORMAL HIGH (ref <150)
VLDL: 34 mg/dL (ref 0–40)

## 2023-06-30 LAB — URINALYSIS, COMPLETE (UACMP) WITH MICROSCOPIC
Bilirubin Urine: NEGATIVE
Glucose, UA: NEGATIVE mg/dL
Hgb urine dipstick: NEGATIVE
Ketones, ur: NEGATIVE mg/dL
Leukocytes,Ua: NEGATIVE
Nitrite: NEGATIVE
Protein, ur: NEGATIVE mg/dL
Specific Gravity, Urine: 1.018 (ref 1.005–1.030)
Squamous Epithelial / HPF: NONE SEEN /HPF (ref 0–5)
pH: 5 (ref 5.0–8.0)

## 2023-06-30 LAB — TSH: TSH: 3.003 u[IU]/mL (ref 0.350–4.500)

## 2023-06-30 LAB — FERRITIN: Ferritin: 115 ng/mL (ref 11–307)

## 2023-06-30 NOTE — Telephone Encounter (Signed)
Call placed to patient to review lab results from lab only visit today. Patients hgb 11.3. Dr. Orlie Dakin reviewed lab results Per Dr. Orlie Dakin patient is to continue to hold Hydrea and keep lab and follow up as scheduled in August. Patient verbalized understanding and is in agreement with plan.

## 2023-08-12 ENCOUNTER — Encounter: Payer: Self-pay | Admitting: Oncology

## 2023-08-19 ENCOUNTER — Inpatient Hospital Stay: Payer: Medicare Other | Attending: Oncology | Admitting: Oncology

## 2023-08-19 ENCOUNTER — Other Ambulatory Visit: Payer: Self-pay | Admitting: *Deleted

## 2023-08-19 ENCOUNTER — Encounter: Payer: Self-pay | Admitting: Oncology

## 2023-08-19 ENCOUNTER — Inpatient Hospital Stay: Payer: Medicare Other

## 2023-08-19 VITALS — BP 106/62 | HR 80 | Temp 96.8°F | Resp 18 | Ht 67.0 in | Wt 122.5 lb

## 2023-08-19 DIAGNOSIS — D45 Polycythemia vera: Secondary | ICD-10-CM | POA: Insufficient documentation

## 2023-08-19 DIAGNOSIS — D72829 Elevated white blood cell count, unspecified: Secondary | ICD-10-CM | POA: Insufficient documentation

## 2023-08-19 DIAGNOSIS — R531 Weakness: Secondary | ICD-10-CM | POA: Diagnosis not present

## 2023-08-19 DIAGNOSIS — R5383 Other fatigue: Secondary | ICD-10-CM | POA: Diagnosis not present

## 2023-08-19 LAB — CBC WITH DIFFERENTIAL/PLATELET
Abs Immature Granulocytes: 3.53 10*3/uL — ABNORMAL HIGH (ref 0.00–0.07)
Basophils Absolute: 0.9 10*3/uL — ABNORMAL HIGH (ref 0.0–0.1)
Basophils Relative: 4 %
Eosinophils Absolute: 0.2 10*3/uL (ref 0.0–0.5)
Eosinophils Relative: 1 %
HCT: 41.2 % (ref 36.0–46.0)
Hemoglobin: 12.5 g/dL (ref 12.0–15.0)
Immature Granulocytes: 15 %
Lymphocytes Relative: 9 %
Lymphs Abs: 2.2 10*3/uL (ref 0.7–4.0)
MCH: 25.9 pg — ABNORMAL LOW (ref 26.0–34.0)
MCHC: 30.3 g/dL (ref 30.0–36.0)
MCV: 85.5 fL (ref 80.0–100.0)
Monocytes Absolute: 1.4 10*3/uL — ABNORMAL HIGH (ref 0.1–1.0)
Monocytes Relative: 6 %
Neutro Abs: 15.1 10*3/uL — ABNORMAL HIGH (ref 1.7–7.7)
Neutrophils Relative %: 65 %
Platelets: 395 10*3/uL (ref 150–400)
RBC: 4.82 MIL/uL (ref 3.87–5.11)
RDW: 19.5 % — ABNORMAL HIGH (ref 11.5–15.5)
Smear Review: ADEQUATE
WBC: 23.2 10*3/uL — ABNORMAL HIGH (ref 4.0–10.5)
nRBC: 1 % — ABNORMAL HIGH (ref 0.0–0.2)

## 2023-08-19 LAB — CMP (CANCER CENTER ONLY)
ALT: 19 U/L (ref 0–44)
AST: 35 U/L (ref 15–41)
Albumin: 3.8 g/dL (ref 3.5–5.0)
Alkaline Phosphatase: 128 U/L — ABNORMAL HIGH (ref 38–126)
Anion gap: 7 (ref 5–15)
BUN: 22 mg/dL (ref 8–23)
CO2: 23 mmol/L (ref 22–32)
Calcium: 8.7 mg/dL — ABNORMAL LOW (ref 8.9–10.3)
Chloride: 105 mmol/L (ref 98–111)
Creatinine: 0.52 mg/dL (ref 0.44–1.00)
GFR, Estimated: >60 mL/min (ref >60)
Glucose, Bld: 86 mg/dL (ref 70–99)
Potassium: 4.5 mmol/L (ref 3.5–5.1)
Sodium: 135 mmol/L (ref 135–145)
Total Bilirubin: 0.9 mg/dL (ref 0.3–1.2)
Total Protein: 6.8 g/dL (ref 6.5–8.1)

## 2023-08-19 LAB — IRON AND TIBC
Iron: 33 ug/dL (ref 28–170)
Saturation Ratios: 10 % — ABNORMAL LOW (ref 10.4–31.8)
TIBC: 322 ug/dL (ref 250–450)
UIBC: 289 ug/dL

## 2023-08-19 LAB — PATHOLOGIST SMEAR REVIEW

## 2023-08-19 LAB — FERRITIN: Ferritin: 55 ng/mL (ref 11–307)

## 2023-08-19 NOTE — Progress Notes (Signed)
[facility]  Telephone:(336) [phone] Fax:(336) [phone]  ID: Christeen Douglas OB: 1946-10-28  MR#: [id number]  GEX#:[id number]  Patient Care Team: Marguarite Arbour, MD as PCP - General (Internal Medicine) Jeralyn Ruths, MD as Consulting Physician (Hematology and Oncology)   CHIEF COMPLAINT: JAK-2 positive, polycythemia vera, iron deficiency, heterozygous for hemochromatosis gene mutation H63D   INTERVAL HISTORY: Patient returns to clinic today for repeat laboratory work and further evaluation.  She continues to have chronic weakness and fatigue, but otherwise feels well.  She has no neurologic complaints.  She has a good appetite and denies weight loss.  She denies any fevers, night sweats, or weight loss.  She denies any chest pain, shortness of breath, cough, or hemoptysis.  She denies any nausea, vomiting, constipation, or diarrhea. She has no urinary complaints.  Patient no further specific complaints today.    REVIEW OF SYSTEMS:   Review of Systems  Constitutional:  Positive for malaise/fatigue. Negative for fever and weight loss.  HENT: Negative.  Negative for congestion and sinus pain.   Respiratory: Negative.  Negative for cough and shortness of breath.   Cardiovascular: Negative.  Negative for chest pain and leg swelling.  Gastrointestinal: Negative.  Negative for abdominal pain, constipation, diarrhea, nausea and vomiting.  Genitourinary: Negative.  Negative for dysuria and urgency.  Musculoskeletal: Negative.  Negative for back pain.  Neurological:  Positive for weakness. Negative for dizziness and headaches.  Endo/Heme/Allergies:  Does not bruise/bleed easily.  Psychiatric/Behavioral: Negative.  The patient is not nervous/anxious and does not have insomnia.     As per HPI. Otherwise, a complete review of systems is negative.  PAST MEDICAL HISTORY: Past Medical History:  Diagnosis Date   Abnormality of gait 03/19/2016   Hyperthyroidism     Polio    PSVT (paroxysmal supraventricular tachycardia)     PAST SURGICAL HISTORY: Past Surgical History:  Procedure Laterality Date   BREAST BIOPSY Right 1999   EXCISIONAL - NEG   BREAST BIOPSY Right 2012   EXCISIONAL - NEG   BREAST SURGERY     breast biopsies   COLONOSCOPY WITH PROPOFOL N/A 07/04/2015   Procedure: COLONOSCOPY WITH PROPOFOL;  Surgeon: Scot Jun, MD;  Location: [facility] ENDOSCOPY;  Service: Endoscopy;  Laterality: N/A;   ELECTROPHYSIOLOGIC STUDY N/A 12/16/2015   Procedure: SVT Ablation;  Surgeon: Marinus Maw, MD;  Location: [facility] INVASIVE CV LAB;  Service: Cardiovascular;  Laterality: N/A;   TIBIA IM NAIL INSERTION Right 09/09/2020   Procedure: INTRAMEDULLARY (IM) NAIL TIBIAL;  Surgeon: Kennedy Bucker, MD;  Location: [facility] ORS;  Service: Orthopedics;  Laterality: Right;   TONSILLECTOMY      FAMILY HISTORY: Reviewed and unchanged. No reported history of malignancy or chronic disease.     ADVANCED DIRECTIVES:    HEALTH MAINTENANCE: Social History   Tobacco Use   Smoking status: Never   Smokeless tobacco: Never  Substance Use Topics   Alcohol use: No     Colonoscopy:  PAP:  Bone density:  Lipid panel:  Allergies  Allergen Reactions   Bee Venom Anaphylaxis   Caffeine     Fever, chills, withdrawal symptoms    Current Outpatient Medications  Medication Sig Dispense Refill   ARMOUR THYROID 60 MG tablet Take 75 mg by mouth daily.  4   aspirin EC 325 MG tablet Take 1 tablet (325 mg total) by mouth daily. 30 tablet 0   cholecalciferol (VITAMIN D) 1000 units tablet Take 1,000 Units by mouth daily.  Cyanocobalamin 1000 MCG TBCR Take 1,000 mcg by mouth daily.      EPINEPHrine 0.3 mg/0.3 mL IJ SOAJ injection Inject into the muscle.     Fish Oil-Cholecalciferol (FISH OIL + D3) 1000-1000 MG-UNIT CAPS Take 1,000 mg by mouth daily.      HYDROcodone-acetaminophen (NORCO/VICODIN) 5-325 MG tablet      hydroxyurea (HYDREA) 500 MG capsule TAKE 1 CAPSULE BY MOUTH  DAILY. MAY TAKE WITH FOOD TO MINIMIZE GASTROINTESTINAL SIDE EFFECTS. 90 capsule 3   L-Theanine 100 MG CAPS Take 100 mg by mouth at bedtime as needed.      Magnesium 200 MG TABS Take by mouth.     Probiotic Product (PROBIOTIC DAILY PO) Take 1 tablet by mouth daily.     Riboflavin 400 MG CAPS Take 400 mg by mouth daily.      No current facility-administered medications for this visit.    OBJECTIVE: Vitals:   08/19/23 1116  BP: 106/62  Pulse: 80  Resp: 18  Temp: (!) 96.8 F (36 C)  SpO2: 100%     Body mass index is 19.19 kg/m.    ECOG FS:1 - Symptomatic but completely ambulatory   General: Well-developed, well-nourished, no acute distress.  Sitting in a wheelchair. Eyes: Pink conjunctiva, anicteric sclera. HEENT: Normocephalic, moist mucous membranes. Lungs: No audible wheezing or coughing. Heart: Regular rate and rhythm. Abdomen: Soft, nontender, no obvious distention. Musculoskeletal: No edema, cyanosis, or clubbing. Neuro: Alert, answering all questions appropriately. Cranial nerves grossly intact. Skin: No rashes or petechiae noted. Psych: Normal affect.   LAB RESULTS:  Lab Results  Component Value Date   NA 135 08/19/2023   K 4.5 08/19/2023   CL 105 08/19/2023   CO2 23 08/19/2023   GLUCOSE 86 08/19/2023   BUN 22 08/19/2023   CREATININE 0.52 08/19/2023   CALCIUM 8.7 (L) 08/19/2023   PROT 6.8 08/19/2023   ALBUMIN 3.8 08/19/2023   AST 35 08/19/2023   ALT 19 08/19/2023   ALKPHOS 128 (H) 08/19/2023   BILITOT 0.9 08/19/2023   GFRNONAA >60 08/19/2023   GFRAA >60 09/10/2020    Lab Results  Component Value Date   WBC 23.2 (H) 08/19/2023   NEUTROABS 15.1 (H) 08/19/2023   HGB 12.5 08/19/2023   HCT 41.2 08/19/2023   MCV 85.5 08/19/2023   PLT 395 08/19/2023   Lab Results  Component Value Date   IRON 33 08/19/2023   TIBC 322 08/19/2023   IRONPCTSAT 10 (L) 08/19/2023    Lab Results  Component Value Date   FERRITIN 55 08/19/2023     STUDIES: No results  found.  ASSESSMENT: JAK-2 positive, polycythemia vera, iron deficiency, heterozygous for hemochromatosis gene mutation H63D   PLAN:    Polycythemia vera: JAK-2 mutation is positive. She does not require phlebotomy, but would consider in the future if necessary.  Since discontinuing Hydrea in May 2024, patient is no longer anemic and her hemoglobin is now within normal limits at 12.5.  Continue to hold Hydrea at this time.  Return to clinic in 3 months with repeat laboratory work and further evaluation.   Leukocytosis: Patient's white blood cell count is slowly trending up since discontinuing Hydrea.  Possible blasts are noted on patient's periphery and pathologist review is pending.  Previously, BCR/ABL was negative for underlying CML.  Peripheral blood flow cytometry did not reveal any abnormality.  Can consider bone marrow biopsy if necessary.  Will repeat BCR/ABL and flow cytometry with next blood draw. Hemochromatosis: Patient is heterozygote.  Likely  clinically insignificant.  Her most recent ferritin is 55. Fractured leg/foot: Resolved. Patient reports her fractures are likely secondary to her distant history of polio.  I spent a total of 30 minutes reviewing chart data, face-to-face evaluation with the patient, counseling and coordination of care as detailed above.   Patient expressed understanding and was in agreement with this plan. She also understands that She can call clinic at any time with any questions, concerns, or complaints.    Jeralyn Ruths, MD 08/19/23 12:27 PM

## 2023-08-25 ENCOUNTER — Inpatient Hospital Stay: Payer: Medicare Other | Attending: Oncology

## 2023-08-25 DIAGNOSIS — D45 Polycythemia vera: Secondary | ICD-10-CM | POA: Insufficient documentation

## 2023-08-25 LAB — CBC WITH DIFFERENTIAL/PLATELET
Abs Immature Granulocytes: 3.38 10*3/uL — ABNORMAL HIGH (ref 0.00–0.07)
Basophils Absolute: 0.9 10*3/uL — ABNORMAL HIGH (ref 0.0–0.1)
Basophils Relative: 4 %
Eosinophils Absolute: 0.2 10*3/uL (ref 0.0–0.5)
Eosinophils Relative: 1 %
HCT: 41.4 % (ref 36.0–46.0)
Hemoglobin: 12.6 g/dL (ref 12.0–15.0)
Immature Granulocytes: 14 %
Lymphocytes Relative: 9 %
Lymphs Abs: 2.2 10*3/uL (ref 0.7–4.0)
MCH: 25.9 pg — ABNORMAL LOW (ref 26.0–34.0)
MCHC: 30.4 g/dL (ref 30.0–36.0)
MCV: 85 fL (ref 80.0–100.0)
Monocytes Absolute: 1.1 10*3/uL — ABNORMAL HIGH (ref 0.1–1.0)
Monocytes Relative: 5 %
Neutro Abs: 16 10*3/uL — ABNORMAL HIGH (ref 1.7–7.7)
Neutrophils Relative %: 67 %
Platelets: 361 10*3/uL (ref 150–400)
RBC: 4.87 MIL/uL (ref 3.87–5.11)
RDW: 19.7 % — ABNORMAL HIGH (ref 11.5–15.5)
Smear Review: NORMAL
WBC: 23.8 10*3/uL — ABNORMAL HIGH (ref 4.0–10.5)
nRBC: 1 % — ABNORMAL HIGH (ref 0.0–0.2)

## 2023-08-29 LAB — COMP PANEL: LEUKEMIA/LYMPHOMA: Immunophenotypic Profile: 1

## 2023-11-21 ENCOUNTER — Inpatient Hospital Stay: Payer: Medicare Other | Attending: Oncology

## 2023-11-21 DIAGNOSIS — D72829 Elevated white blood cell count, unspecified: Secondary | ICD-10-CM | POA: Insufficient documentation

## 2023-11-21 DIAGNOSIS — D45 Polycythemia vera: Secondary | ICD-10-CM | POA: Diagnosis present

## 2023-11-21 LAB — COMPREHENSIVE METABOLIC PANEL
ALT: 21 U/L (ref 0–44)
AST: 38 U/L (ref 15–41)
Albumin: 3.7 g/dL (ref 3.5–5.0)
Alkaline Phosphatase: 129 U/L — ABNORMAL HIGH (ref 38–126)
Anion gap: 10 (ref 5–15)
BUN: 23 mg/dL (ref 8–23)
CO2: 24 mmol/L (ref 22–32)
Calcium: 8.4 mg/dL — ABNORMAL LOW (ref 8.9–10.3)
Chloride: 104 mmol/L (ref 98–111)
Creatinine, Ser: 0.61 mg/dL (ref 0.44–1.00)
GFR, Estimated: >60 mL/min (ref >60)
Glucose, Bld: 91 mg/dL (ref 70–99)
Potassium: 4 mmol/L (ref 3.5–5.1)
Sodium: 138 mmol/L (ref 135–145)
Total Bilirubin: 1.1 mg/dL (ref <1.2)
Total Protein: 6.4 g/dL — ABNORMAL LOW (ref 6.5–8.1)

## 2023-11-21 LAB — CBC WITH DIFFERENTIAL/PLATELET
Abs Immature Granulocytes: 3.37 10*3/uL — ABNORMAL HIGH (ref 0.00–0.07)
Basophils Absolute: 1 10*3/uL — ABNORMAL HIGH (ref 0.0–0.1)
Basophils Relative: 5 %
Eosinophils Absolute: 0.1 10*3/uL (ref 0.0–0.5)
Eosinophils Relative: 1 %
HCT: 46.4 % — ABNORMAL HIGH (ref 36.0–46.0)
Hemoglobin: 13.7 g/dL (ref 12.0–15.0)
Immature Granulocytes: 16 %
Lymphocytes Relative: 9 %
Lymphs Abs: 2 10*3/uL (ref 0.7–4.0)
MCH: 23.5 pg — ABNORMAL LOW (ref 26.0–34.0)
MCHC: 29.5 g/dL — ABNORMAL LOW (ref 30.0–36.0)
MCV: 79.6 fL — ABNORMAL LOW (ref 80.0–100.0)
Monocytes Absolute: 1.2 10*3/uL — ABNORMAL HIGH (ref 0.1–1.0)
Monocytes Relative: 6 %
Neutro Abs: 14 10*3/uL — ABNORMAL HIGH (ref 1.7–7.7)
Neutrophils Relative %: 63 %
Platelets: 299 10*3/uL (ref 150–400)
RBC: 5.83 MIL/uL — ABNORMAL HIGH (ref 3.87–5.11)
RDW: 20.7 % — ABNORMAL HIGH (ref 11.5–15.5)
Smear Review: NORMAL
WBC: 21.7 10*3/uL — ABNORMAL HIGH (ref 4.0–10.5)
nRBC: 0.9 % — ABNORMAL HIGH (ref 0.0–0.2)

## 2023-11-23 LAB — COMP PANEL: LEUKEMIA/LYMPHOMA: Immunophenotypic Profile: 1

## 2023-11-24 LAB — BCR-ABL1, CML/ALL, PCR, QUANT
E1A2 Transcript: <0.0032 %
Interpretation (BCRAL):: NEGATIVE
b2a2 transcript: <0.0032 %
b3a2 transcript: <0.0032 %

## 2023-11-28 ENCOUNTER — Encounter: Payer: Self-pay | Admitting: Oncology

## 2023-11-28 ENCOUNTER — Inpatient Hospital Stay (HOSPITAL_BASED_OUTPATIENT_CLINIC_OR_DEPARTMENT_OTHER): Payer: Medicare Other | Admitting: Oncology

## 2023-11-28 VITALS — BP 111/54 | HR 83 | Temp 97.4°F | Resp 16 | Ht 67.0 in | Wt 119.0 lb

## 2023-11-28 DIAGNOSIS — D72829 Elevated white blood cell count, unspecified: Secondary | ICD-10-CM | POA: Diagnosis not present

## 2023-11-28 DIAGNOSIS — D45 Polycythemia vera: Secondary | ICD-10-CM | POA: Diagnosis not present

## 2023-11-28 NOTE — Progress Notes (Signed)
[facility]  Telephone:(336) [phone] Fax:(336) [phone]  ID: Priscilla Berry OB: 08-10-1946  MR#: [id number]  TKZ#:[id number]  Patient Care Team: Marguarite Arbour, MD as PCP - General (Internal Medicine) Jeralyn Ruths, MD as Consulting Physician (Hematology and Oncology)   CHIEF COMPLAINT: JAK-2 positive, polycythemia vera, iron deficiency, heterozygous for hemochromatosis gene mutation H63D   INTERVAL HISTORY: Patient returns to clinic today for repeat laboratory work and further evaluation.  She currently feels well and is asymptomatic.  She does not complain of any weakness or fatigue today.  She has no neurologic complaints.  She has a good appetite and denies weight loss.  She denies any fevers, night sweats, or weight loss.  She denies any chest pain, shortness of breath, cough, or hemoptysis.  She denies any nausea, vomiting, constipation, or diarrhea. She has no urinary complaints.  Patient offers no specific complaints today.  REVIEW OF SYSTEMS:   Review of Systems  Constitutional: Negative.  Negative for fever, malaise/fatigue and weight loss.  HENT: Negative.  Negative for congestion and sinus pain.   Respiratory: Negative.  Negative for cough and shortness of breath.   Cardiovascular: Negative.  Negative for chest pain and leg swelling.  Gastrointestinal: Negative.  Negative for abdominal pain, constipation, diarrhea, nausea and vomiting.  Genitourinary: Negative.  Negative for dysuria and urgency.  Musculoskeletal: Negative.  Negative for back pain.  Neurological: Negative.  Negative for dizziness, weakness and headaches.  Endo/Heme/Allergies:  Does not bruise/bleed easily.  Psychiatric/Behavioral: Negative.  The patient is not nervous/anxious and does not have insomnia.     As per HPI. Otherwise, a complete review of systems is negative.  PAST MEDICAL HISTORY: Past Medical History:  Diagnosis Date   Abnormality of gait 03/19/2016    Hyperthyroidism    Polio    PSVT (paroxysmal supraventricular tachycardia) (HCC)     PAST SURGICAL HISTORY: Past Surgical History:  Procedure Laterality Date   BREAST BIOPSY Right 1999   EXCISIONAL - NEG   BREAST BIOPSY Right 2012   EXCISIONAL - NEG   BREAST SURGERY     breast biopsies   COLONOSCOPY WITH PROPOFOL N/A 07/04/2015   Procedure: COLONOSCOPY WITH PROPOFOL;  Surgeon: Scot Jun, MD;  Location: [facility] ENDOSCOPY;  Service: Endoscopy;  Laterality: N/A;   ELECTROPHYSIOLOGIC STUDY N/A 12/16/2015   Procedure: SVT Ablation;  Surgeon: Marinus Maw, MD;  Location: [facility] INVASIVE CV LAB;  Service: Cardiovascular;  Laterality: N/A;   TIBIA IM NAIL INSERTION Right 09/09/2020   Procedure: INTRAMEDULLARY (IM) NAIL TIBIAL;  Surgeon: Kennedy Bucker, MD;  Location: [facility] ORS;  Service: Orthopedics;  Laterality: Right;   TONSILLECTOMY      FAMILY HISTORY: Reviewed and unchanged. No reported history of malignancy or chronic disease.     ADVANCED DIRECTIVES:    HEALTH MAINTENANCE: Social History   Tobacco Use   Smoking status: Never   Smokeless tobacco: Never  Substance Use Topics   Alcohol use: No     Colonoscopy:  PAP:  Bone density:  Lipid panel:  Allergies  Allergen Reactions   Bee Venom Anaphylaxis   Caffeine     Fever, chills, withdrawal symptoms    Current Outpatient Medications  Medication Sig Dispense Refill   ARMOUR THYROID 60 MG tablet Take 75 mg by mouth daily.  4   aspirin EC 325 MG tablet Take 1 tablet (325 mg total) by mouth daily. 30 tablet 0   cholecalciferol (VITAMIN D) 1000 units tablet Take 1,000 Units by mouth  daily.     Cyanocobalamin 1000 MCG TBCR Take 1,000 mcg by mouth daily.      EPINEPHrine 0.3 mg/0.3 mL IJ SOAJ injection Inject into the muscle.     Fish Oil-Cholecalciferol (FISH OIL + D3) 1000-1000 MG-UNIT CAPS Take 1,000 mg by mouth daily.      HYDROcodone-acetaminophen (NORCO/VICODIN) 5-325 MG tablet      L-Theanine 100 MG CAPS Take 100  mg by mouth at bedtime as needed.      Magnesium 200 MG TABS Take by mouth.     Probiotic Product (PROBIOTIC DAILY PO) Take 1 tablet by mouth daily.     Riboflavin 400 MG CAPS Take 400 mg by mouth daily.      hydroxyurea (HYDREA) 500 MG capsule TAKE 1 CAPSULE BY MOUTH DAILY. MAY TAKE WITH FOOD TO MINIMIZE GASTROINTESTINAL SIDE EFFECTS. (Patient not taking: Reported on 11/28/2023) 90 capsule 3   No current facility-administered medications for this visit.    OBJECTIVE: Vitals:   11/28/23 1032  BP: (!) 111/54  Pulse: 83  Resp: 16  Temp: (!) 97.4 F (36.3 C)  SpO2: 99%     Body mass index is 18.64 kg/m.    ECOG FS:1 - Symptomatic but completely ambulatory   General: Well-developed, well-nourished, no acute distress.  Sitting in a wheelchair. Eyes: Pink conjunctiva, anicteric sclera. HEENT: Normocephalic, moist mucous membranes. Lungs: No audible wheezing or coughing. Heart: Regular rate and rhythm. Abdomen: Soft, nontender, no obvious distention. Musculoskeletal: No edema, cyanosis, or clubbing. Neuro: Alert, answering all questions appropriately. Cranial nerves grossly intact. Skin: No rashes or petechiae noted. Psych: Normal affect.  LAB RESULTS:  Lab Results  Component Value Date   NA 138 11/21/2023   K 4.0 11/21/2023   CL 104 11/21/2023   CO2 24 11/21/2023   GLUCOSE 91 11/21/2023   BUN 23 11/21/2023   CREATININE 0.61 11/21/2023   CALCIUM 8.4 (L) 11/21/2023   PROT 6.4 (L) 11/21/2023   ALBUMIN 3.7 11/21/2023   AST 38 11/21/2023   ALT 21 11/21/2023   ALKPHOS 129 (H) 11/21/2023   BILITOT 1.1 11/21/2023   GFRNONAA >60 11/21/2023   GFRAA >60 09/10/2020    Lab Results  Component Value Date   WBC 21.7 (H) 11/21/2023   NEUTROABS 14.0 (H) 11/21/2023   HGB 13.7 11/21/2023   HCT 46.4 (H) 11/21/2023   MCV 79.6 (L) 11/21/2023   PLT 299 11/21/2023   Lab Results  Component Value Date   IRON 33 08/19/2023   TIBC 322 08/19/2023   IRONPCTSAT 10 (L) 08/19/2023    Lab  Results  Component Value Date   FERRITIN 55 08/19/2023     STUDIES: No results found.  ASSESSMENT: JAK-2 positive, polycythemia vera, iron deficiency, heterozygous for hemochromatosis gene mutation H63D   PLAN:    Polycythemia vera: JAK-2 mutation is positive. She does not require phlebotomy, but would consider in the future if necessary.  Since discontinuing Hydrea in May 2024, patient is no longer anemic.  Although her hemoglobin is within normal limits, has trended up slightly to 13.7.  Continue to hold Hydrea at this time, but patient may need to reinitiate treatment in the future.  Return to clinic in 3 months with repeat laboratory work and further evaluation.  Leukocytosis: Patient's white blood cell count slowly increased since discontinuing Hydrea, but now has stabilized at 21.7.  Repeat BCR-ABL mutation and peripheral blood flow cytometry did not reveal any significant abnormalities.  BCR/ABL was negative for underlying CML.  Peripheral blood  flow cytometry did not reveal any abnormality.  Can consider bone marrow biopsy if necessary.  Monitor.   Hemochromatosis: Patient is heterozygote.  Likely clinically insignificant.  Her most recent ferritin is 55. Fractured leg/foot: Resolved. Patient reports her fractures are likely secondary to her distant history of polio.  I spent a total of 30 minutes reviewing chart data, face-to-face evaluation with the patient, counseling and coordination of care as detailed above.    Patient expressed understanding and was in agreement with this plan. She also understands that She can call clinic at any time with any questions, concerns, or complaints.    Jeralyn Ruths, MD 11/28/23 1:10 PM

## 2024-02-27 ENCOUNTER — Inpatient Hospital Stay: Payer: Medicare Other

## 2024-02-27 ENCOUNTER — Inpatient Hospital Stay: Payer: Medicare Other | Attending: Oncology | Admitting: Oncology

## 2024-02-27 ENCOUNTER — Encounter: Payer: Self-pay | Admitting: Oncology

## 2024-02-27 VITALS — BP 112/69 | HR 112 | Temp 99.5°F | Resp 16 | Ht 67.0 in | Wt 117.0 lb

## 2024-02-27 DIAGNOSIS — D45 Polycythemia vera: Secondary | ICD-10-CM | POA: Insufficient documentation

## 2024-02-27 DIAGNOSIS — R634 Abnormal weight loss: Secondary | ICD-10-CM | POA: Insufficient documentation

## 2024-02-27 DIAGNOSIS — D72829 Elevated white blood cell count, unspecified: Secondary | ICD-10-CM | POA: Diagnosis not present

## 2024-02-27 LAB — CBC WITH DIFFERENTIAL/PLATELET
Abs Immature Granulocytes: 3.39 10*3/uL — ABNORMAL HIGH (ref 0.00–0.07)
Basophils Absolute: 0.9 10*3/uL — ABNORMAL HIGH (ref 0.0–0.1)
Basophils Relative: 4 %
Eosinophils Absolute: 0.1 10*3/uL (ref 0.0–0.5)
Eosinophils Relative: 1 %
HCT: 49 % — ABNORMAL HIGH (ref 36.0–46.0)
Hemoglobin: 14.5 g/dL (ref 12.0–15.0)
Immature Granulocytes: 15 %
Lymphocytes Relative: 8 %
Lymphs Abs: 1.7 10*3/uL (ref 0.7–4.0)
MCH: 21.9 pg — ABNORMAL LOW (ref 26.0–34.0)
MCHC: 29.6 g/dL — ABNORMAL LOW (ref 30.0–36.0)
MCV: 74 fL — ABNORMAL LOW (ref 80.0–100.0)
Monocytes Absolute: 1.4 10*3/uL — ABNORMAL HIGH (ref 0.1–1.0)
Monocytes Relative: 6 %
Neutro Abs: 14.7 10*3/uL — ABNORMAL HIGH (ref 1.7–7.7)
Neutrophils Relative %: 66 %
Platelets: 313 10*3/uL (ref 150–400)
RBC: 6.62 MIL/uL — ABNORMAL HIGH (ref 3.87–5.11)
RDW: 20.7 % — ABNORMAL HIGH (ref 11.5–15.5)
Smear Review: NORMAL
WBC: 22.3 10*3/uL — ABNORMAL HIGH (ref 4.0–10.5)
nRBC: 1.2 % — ABNORMAL HIGH (ref 0.0–0.2)

## 2024-02-27 LAB — CMP (CANCER CENTER ONLY)
ALT: 17 U/L (ref 0–44)
AST: 34 U/L (ref 15–41)
Albumin: 3.8 g/dL (ref 3.5–5.0)
Alkaline Phosphatase: 120 U/L (ref 38–126)
Anion gap: 8 (ref 5–15)
BUN: 35 mg/dL — ABNORMAL HIGH (ref 8–23)
CO2: 24 mmol/L (ref 22–32)
Calcium: 8.9 mg/dL (ref 8.9–10.3)
Chloride: 103 mmol/L (ref 98–111)
Creatinine: 0.57 mg/dL (ref 0.44–1.00)
GFR, Estimated: >60 mL/min (ref >60)
Glucose, Bld: 102 mg/dL — ABNORMAL HIGH (ref 70–99)
Potassium: 4.2 mmol/L (ref 3.5–5.1)
Sodium: 135 mmol/L (ref 135–145)
Total Bilirubin: 1.1 mg/dL (ref 0.0–1.2)
Total Protein: 6.7 g/dL (ref 6.5–8.1)

## 2024-02-27 NOTE — Progress Notes (Signed)
 [facility]  Telephone:(336) [phone] Fax:(336) [phone]  ID: Priscilla Berry OB: 07/18/1946  MR#: [id number]  QIO#:[id number]  Patient Care Team: Marguarite Arbour, MD as PCP - General (Internal Medicine) Jeralyn Ruths, MD as Consulting Physician (Hematology and Oncology)   CHIEF COMPLAINT: JAK-2 positive, polycythemia vera, iron deficiency, heterozygous for hemochromatosis gene mutation H63D   INTERVAL HISTORY: Patient returns to clinic today for repeat laboratory work, further evaluation, and consideration of reinitiating Hydrea.  She continues to feel well and remains asymptomatic.  She has a good appetite, but her family is concerned with some weight loss. She does not complain of any weakness or fatigue today.  She has no neurologic complaints.  She has a good appetite and denies weight loss.  She denies any fevers, night sweats, or weight loss.  She denies any chest pain, shortness of breath, cough, or hemoptysis.  She denies any nausea, vomiting, constipation, or diarrhea. She has no urinary complaints.  Patient offers no further specific complaints today.  REVIEW OF SYSTEMS:   Review of Systems  Constitutional:  Positive for weight loss. Negative for fever and malaise/fatigue.  HENT: Negative.  Negative for congestion and sinus pain.   Respiratory: Negative.  Negative for cough and shortness of breath.   Cardiovascular: Negative.  Negative for chest pain and leg swelling.  Gastrointestinal: Negative.  Negative for abdominal pain, constipation, diarrhea, nausea and vomiting.  Genitourinary: Negative.  Negative for dysuria and urgency.  Musculoskeletal: Negative.  Negative for back pain.  Neurological: Negative.  Negative for dizziness, weakness and headaches.  Endo/Heme/Allergies:  Does not bruise/bleed easily.  Psychiatric/Behavioral: Negative.  The patient is not nervous/anxious and does not have insomnia.     As per HPI. Otherwise, a complete review  of systems is negative.  PAST MEDICAL HISTORY: Past Medical History:  Diagnosis Date   Abnormality of gait 03/19/2016   Hyperthyroidism    Polio    PSVT (paroxysmal supraventricular tachycardia) (HCC)     PAST SURGICAL HISTORY: Past Surgical History:  Procedure Laterality Date   BREAST BIOPSY Right 1999   EXCISIONAL - NEG   BREAST BIOPSY Right 2012   EXCISIONAL - NEG   BREAST SURGERY     breast biopsies   COLONOSCOPY WITH PROPOFOL N/A 07/04/2015   Procedure: COLONOSCOPY WITH PROPOFOL;  Surgeon: Scot Jun, MD;  Location: [facility] ENDOSCOPY;  Service: Endoscopy;  Laterality: N/A;   ELECTROPHYSIOLOGIC STUDY N/A 12/16/2015   Procedure: SVT Ablation;  Surgeon: Marinus Maw, MD;  Location: [facility] INVASIVE CV LAB;  Service: Cardiovascular;  Laterality: N/A;   TIBIA IM NAIL INSERTION Right 09/09/2020   Procedure: INTRAMEDULLARY (IM) NAIL TIBIAL;  Surgeon: Kennedy Bucker, MD;  Location: [facility] ORS;  Service: Orthopedics;  Laterality: Right;   TONSILLECTOMY      FAMILY HISTORY: Reviewed and unchanged. No reported history of malignancy or chronic disease.     ADVANCED DIRECTIVES:    HEALTH MAINTENANCE: Social History   Tobacco Use   Smoking status: Never   Smokeless tobacco: Never  Substance Use Topics   Alcohol use: No     Colonoscopy:  PAP:  Bone density:  Lipid panel:  Allergies  Allergen Reactions   Bee Venom Anaphylaxis   Caffeine     Fever, chills, withdrawal symptoms    Current Outpatient Medications  Medication Sig Dispense Refill   ARMOUR THYROID 60 MG tablet Take 75 mg by mouth daily.  4   aspirin EC 325 MG tablet Take 1 tablet (325  mg total) by mouth daily. 30 tablet 0   cholecalciferol (VITAMIN D) 1000 units tablet Take 1,000 Units by mouth daily.     Cyanocobalamin 1000 MCG TBCR Take 1,000 mcg by mouth daily.      EPINEPHrine 0.3 mg/0.3 mL IJ SOAJ injection Inject into the muscle.     Fish Oil-Cholecalciferol (FISH OIL + D3) 1000-1000 MG-UNIT CAPS Take  1,000 mg by mouth daily.      HYDROcodone-acetaminophen (NORCO/VICODIN) 5-325 MG tablet      L-Theanine 100 MG CAPS Take 100 mg by mouth at bedtime as needed.      Magnesium 200 MG TABS Take by mouth.     Probiotic Product (PROBIOTIC DAILY PO) Take 1 tablet by mouth daily.     Riboflavin 400 MG CAPS Take 400 mg by mouth daily.      hydroxyurea (HYDREA) 500 MG capsule TAKE 1 CAPSULE BY MOUTH DAILY. MAY TAKE WITH FOOD TO MINIMIZE GASTROINTESTINAL SIDE EFFECTS. (Patient not taking: Reported on 02/27/2024) 90 capsule 3   No current facility-administered medications for this visit.    OBJECTIVE: Vitals:   02/27/24 1024  BP: 112/69  Pulse: (!) 112  Resp: 16  Temp: 99.5 F (37.5 C)  SpO2: 99%     Body mass index is 18.32 kg/m.    ECOG FS:1 - Symptomatic but completely ambulatory   General: Well-developed, well-nourished, no acute distress.  Sitting in a wheelchair. Eyes: Pink conjunctiva, anicteric sclera. HEENT: Normocephalic, moist mucous membranes. Lungs: No audible wheezing or coughing. Heart: Regular rate and rhythm. Abdomen: Soft, nontender, no obvious distention. Musculoskeletal: No edema, cyanosis, or clubbing. Neuro: Alert, answering all questions appropriately. Cranial nerves grossly intact. Skin: No rashes or petechiae noted. Psych: Normal affect.  LAB RESULTS:  Lab Results  Component Value Date   NA 138 11/21/2023   K 4.0 11/21/2023   CL 104 11/21/2023   CO2 24 11/21/2023   GLUCOSE 91 11/21/2023   BUN 23 11/21/2023   CREATININE 0.61 11/21/2023   CALCIUM 8.4 (L) 11/21/2023   PROT 6.4 (L) 11/21/2023   ALBUMIN 3.7 11/21/2023   AST 38 11/21/2023   ALT 21 11/21/2023   ALKPHOS 129 (H) 11/21/2023   BILITOT 1.1 11/21/2023   GFRNONAA >60 11/21/2023   GFRAA >60 09/10/2020    Lab Results  Component Value Date   WBC 21.7 (H) 11/21/2023   NEUTROABS 14.0 (H) 11/21/2023   HGB 13.7 11/21/2023   HCT 46.4 (H) 11/21/2023   MCV 79.6 (L) 11/21/2023   PLT 299 11/21/2023    Lab Results  Component Value Date   IRON 33 08/19/2023   TIBC 322 08/19/2023   IRONPCTSAT 10 (L) 08/19/2023    Lab Results  Component Value Date   FERRITIN 55 08/19/2023     STUDIES: No results found.  ASSESSMENT: JAK-2 positive, polycythemia vera, iron deficiency, heterozygous for hemochromatosis gene mutation H63D   PLAN:    Polycythemia vera: JAK-2 mutation is positive. She does not require phlebotomy, but would consider in the future if necessary.  Since discontinuing Hydrea in May 2024, patient is no longer anemic.  Hemoglobin continues to be within normal limits, but is slowly trending up and is now 14.5.  Will continue to hold Hydrea at this time, the patient likely will need to reinitiate in the near future.  Return to clinic in 3 months with repeat laboratory work and further evaluation.   Leukocytosis: Chronic and unchanged.  Patient's white blood cell count slowly increased since discontinuing Hydrea, but  now has stabilized at 22.3.  Repeat BCR-ABL mutation and peripheral blood flow cytometry did not reveal any significant abnormalities.  A bone marrow biopsy is not necessary at this time.  Monitor.   Hemochromatosis: Patient is heterozygote.  Likely clinically insignificant.  Her most recent ferritin is 55. Fractured leg/foot: Resolved. Patient reports her fractures are likely secondary to her distant history of polio. Weight loss: Patient states she has added protein drinks to increase her calorie count.  Monitor and consider referral to dietary if necessary.   Patient expressed understanding and was in agreement with this plan. She also understands that She can call clinic at any time with any questions, concerns, or complaints.    Jeralyn Ruths, MD 02/27/24 10:43 AM

## 2024-02-29 LAB — COMP PANEL: LEUKEMIA/LYMPHOMA: Immunophenotypic Profile: 1

## 2024-03-04 LAB — BCR-ABL1, CML/ALL, PCR, QUANT
E1A2 Transcript: <0.0032 %
Interpretation (BCRAL):: NEGATIVE
b2a2 transcript: <0.0032 %
b3a2 transcript: <0.0032 %

## 2024-05-28 ENCOUNTER — Other Ambulatory Visit: Payer: Self-pay | Admitting: *Deleted

## 2024-05-28 DIAGNOSIS — D45 Polycythemia vera: Secondary | ICD-10-CM

## 2024-05-28 DIAGNOSIS — D72829 Elevated white blood cell count, unspecified: Secondary | ICD-10-CM

## 2024-05-29 ENCOUNTER — Inpatient Hospital Stay: Attending: Oncology

## 2024-05-29 ENCOUNTER — Inpatient Hospital Stay (HOSPITAL_BASED_OUTPATIENT_CLINIC_OR_DEPARTMENT_OTHER): Admitting: Oncology

## 2024-05-29 ENCOUNTER — Encounter: Payer: Self-pay | Admitting: Oncology

## 2024-05-29 VITALS — BP 111/66 | HR 87 | Temp 98.0°F | Resp 16 | Ht 67.0 in | Wt 114.7 lb

## 2024-05-29 DIAGNOSIS — R634 Abnormal weight loss: Secondary | ICD-10-CM | POA: Insufficient documentation

## 2024-05-29 DIAGNOSIS — Z681 Body mass index (BMI) 19 or less, adult: Secondary | ICD-10-CM | POA: Diagnosis not present

## 2024-05-29 DIAGNOSIS — D72829 Elevated white blood cell count, unspecified: Secondary | ICD-10-CM | POA: Diagnosis not present

## 2024-05-29 DIAGNOSIS — D45 Polycythemia vera: Secondary | ICD-10-CM

## 2024-05-29 LAB — CBC WITH DIFFERENTIAL (CANCER CENTER ONLY)
Abs Immature Granulocytes: 4.59 10*3/uL — ABNORMAL HIGH (ref 0.00–0.07)
Basophils Absolute: 1.3 10*3/uL — ABNORMAL HIGH (ref 0.0–0.1)
Basophils Relative: 5 %
Eosinophils Absolute: 0.3 10*3/uL (ref 0.0–0.5)
Eosinophils Relative: 1 %
HCT: 48.1 % — ABNORMAL HIGH (ref 36.0–46.0)
Hemoglobin: 14 g/dL (ref 12.0–15.0)
Immature Granulocytes: 17 %
Lymphocytes Relative: 8 %
Lymphs Abs: 2.1 10*3/uL (ref 0.7–4.0)
MCH: 20.5 pg — ABNORMAL LOW (ref 26.0–34.0)
MCHC: 29.1 g/dL — ABNORMAL LOW (ref 30.0–36.0)
MCV: 70.4 fL — ABNORMAL LOW (ref 80.0–100.0)
Monocytes Absolute: 1.7 10*3/uL — ABNORMAL HIGH (ref 0.1–1.0)
Monocytes Relative: 6 %
Neutro Abs: 17.5 10*3/uL — ABNORMAL HIGH (ref 1.7–7.7)
Neutrophils Relative %: 63 %
Platelet Count: 339 10*3/uL (ref 150–400)
RBC: 6.83 MIL/uL — ABNORMAL HIGH (ref 3.87–5.11)
RDW: 21.5 % — ABNORMAL HIGH (ref 11.5–15.5)
Smear Review: NORMAL
WBC Count: 27.5 10*3/uL — ABNORMAL HIGH (ref 4.0–10.5)
nRBC: 1.8 % — ABNORMAL HIGH (ref 0.0–0.2)

## 2024-05-29 LAB — IRON AND TIBC
Iron: 17 ug/dL — ABNORMAL LOW (ref 28–170)
Saturation Ratios: 4 % — ABNORMAL LOW (ref 10.4–31.8)
TIBC: 477 ug/dL — ABNORMAL HIGH (ref 250–450)
UIBC: 460 ug/dL

## 2024-05-29 LAB — FERRITIN: Ferritin: 17 ng/mL (ref 11–307)

## 2024-05-29 NOTE — Progress Notes (Signed)
 [facility]  [phone] [phone]) [phone]  ID: Priscilla Berry OB: 03-Oct-1946  MR#: [id number]  YQI#:[id number]  Patient Care Team: Yehuda Helms, MD as PCP - General (Internal Medicine) Shellie Dials, MD as Consulting Physician (Hematology and Oncology)   CHIEF COMPLAINT: JAK-2 positive, polycythemia vera, iron deficiency, heterozygous for hemochromatosis gene mutation H63D   INTERVAL HISTORY: Patient returns to clinic today for repeat laboratory work and further evaluation.  She complains of continued unintentional weight loss, but otherwise feels well and is at her baseline.  She does not complain of any weakness or fatigue today.  She has no neurologic complaints.  She has a good appetite and denies weight loss.  She denies any fevers, night sweats, or weight loss.  She denies any chest pain, shortness of breath, cough, or hemoptysis.  She denies any nausea, vomiting, constipation, or diarrhea. She has no urinary complaints.  Patient offers no further specific complaints today.  REVIEW OF SYSTEMS:   Review of Systems  Constitutional:  Positive for weight loss. Negative for fever and malaise/fatigue.  HENT: Negative.  Negative for congestion and sinus pain.   Respiratory: Negative.  Negative for cough and shortness of breath.   Cardiovascular: Negative.  Negative for chest pain and leg swelling.  Gastrointestinal: Negative.  Negative for abdominal pain, constipation, diarrhea, nausea and vomiting.  Genitourinary: Negative.  Negative for dysuria and urgency.  Musculoskeletal: Negative.  Negative for back pain.  Neurological: Negative.  Negative for dizziness, weakness and headaches.  Endo/Heme/Allergies:  Does not bruise/bleed easily.  Psychiatric/Behavioral: Negative.  The patient is not nervous/anxious and does not have insomnia.     As per HPI. Otherwise, a complete review of systems is negative.  PAST MEDICAL HISTORY: Past Medical  History:  Diagnosis Date   Abnormality of gait 03/19/2016   Hyperthyroidism    Polio    PSVT (paroxysmal supraventricular tachycardia) (HCC)     PAST SURGICAL HISTORY: Past Surgical History:  Procedure Laterality Date   BREAST BIOPSY Right 1999   EXCISIONAL - NEG   BREAST BIOPSY Right 2012   EXCISIONAL - NEG   BREAST SURGERY     breast biopsies   COLONOSCOPY WITH PROPOFOL  N/A 07/04/2015   Procedure: COLONOSCOPY WITH PROPOFOL ;  Surgeon: Cassie Click, MD;  Location: [facility] ENDOSCOPY;  Service: Endoscopy;  Laterality: N/A;   ELECTROPHYSIOLOGIC STUDY N/A 12/16/2015   Procedure: SVT Ablation;  Surgeon: Tammie Fall, MD;  Location: [facility] INVASIVE CV LAB;  Service: Cardiovascular;  Laterality: N/A;   TIBIA IM NAIL INSERTION Right 09/09/2020   Procedure: INTRAMEDULLARY (IM) NAIL TIBIAL;  Surgeon: Molli Angelucci, MD;  Location: [facility] ORS;  Service: Orthopedics;  Laterality: Right;   TONSILLECTOMY      FAMILY HISTORY: Reviewed and unchanged. No reported history of malignancy or chronic disease.     ADVANCED DIRECTIVES:    HEALTH MAINTENANCE: Social History   Tobacco Use   Smoking status: Never   Smokeless tobacco: Never  Substance Use Topics   Alcohol use: No     Colonoscopy:  PAP:  Bone density:  Lipid panel:  Allergies  Allergen Reactions   Bee Venom Anaphylaxis   Caffeine     Fever, chills, withdrawal symptoms    Current Outpatient Medications  Medication Sig Dispense Refill   ARMOUR THYROID  60 MG tablet Take 75 mg by mouth daily.  4   aspirin  EC 325 MG tablet Take 1 tablet (325 mg total) by mouth daily. 30 tablet 0  cholecalciferol  (VITAMIN D ) 1000 units tablet Take 1,000 Units by mouth daily.     Cyanocobalamin  1000 MCG TBCR Take 1,000 mcg by mouth daily.      EPINEPHrine 0.3 mg/0.3 mL IJ SOAJ injection Inject into the muscle.     Fish Oil -Cholecalciferol  (FISH OIL  + D3) 1000-1000 MG-UNIT CAPS Take 1,000 mg by mouth daily.      HYDROcodone -acetaminophen   (NORCO/VICODIN) 5-325 MG tablet      L-Theanine 100 MG CAPS Take 100 mg by mouth at bedtime as needed.      Magnesium  200 MG TABS Take by mouth.     Probiotic Product (PROBIOTIC DAILY PO) Take 1 tablet by mouth daily.     Riboflavin  400 MG CAPS Take 400 mg by mouth daily.      hydroxyurea  (HYDREA ) 500 MG capsule TAKE 1 CAPSULE BY MOUTH DAILY. MAY TAKE WITH FOOD TO MINIMIZE GASTROINTESTINAL SIDE EFFECTS. (Patient not taking: Reported on 05/29/2024) 90 capsule 3   No current facility-administered medications for this visit.    OBJECTIVE: Vitals:   05/29/24 1050  BP: 111/66  Pulse: 87  Resp: 16  Temp: 98 F (36.7 C)  SpO2: 99%     Body mass index is 17.96 kg/m.    ECOG FS:1 - Symptomatic but completely ambulatory   General: Well-developed, well-nourished, no acute distress.  Sitting in a wheelchair. Eyes: Pink conjunctiva, anicteric sclera. HEENT: Normocephalic, moist mucous membranes. Lungs: No audible wheezing or coughing. Heart: Regular rate and rhythm. Abdomen: Soft, nontender, no obvious distention. Musculoskeletal: No edema, cyanosis, or clubbing. Neuro: Alert, answering all questions appropriately. Cranial nerves grossly intact. Skin: No rashes or petechiae noted. Psych: Normal affect.  LAB RESULTS:  Lab Results  Component Value Date   NA 135 02/27/2024   K 4.2 02/27/2024   CL 103 02/27/2024   CO2 24 02/27/2024   GLUCOSE 102 (H) 02/27/2024   BUN 35 (H) 02/27/2024   CREATININE 0.57 02/27/2024   CALCIUM  8.9 02/27/2024   PROT 6.7 02/27/2024   ALBUMIN 3.8 02/27/2024   AST 34 02/27/2024   ALT 17 02/27/2024   ALKPHOS 120 02/27/2024   BILITOT 1.1 02/27/2024   GFRNONAA >60 02/27/2024   GFRAA >60 09/10/2020    Lab Results  Component Value Date   WBC 27.5 (H) 05/29/2024   NEUTROABS 17.5 (H) 05/29/2024   HGB 14.0 05/29/2024   HCT 48.1 (H) 05/29/2024   MCV 70.4 (L) 05/29/2024   PLT 339 05/29/2024   Lab Results  Component Value Date   IRON 33 08/19/2023   TIBC  322 08/19/2023   IRONPCTSAT 10 (L) 08/19/2023    Lab Results  Component Value Date   FERRITIN 55 08/19/2023     STUDIES: No results found.  ASSESSMENT: JAK-2 positive, polycythemia vera, iron deficiency, heterozygous for hemochromatosis gene mutation H63D   PLAN:    Polycythemia vera: JAK-2 mutation is positive. She does not require phlebotomy, but would consider in the future if necessary.  Since discontinuing Hydrea  in May 2024, patient is no longer anemic.  Her hemoglobin remains stable at 14.0.  Continue to hold Hydrea .  Return to clinic in 3 months for laboratory work and further evaluation.     Leukocytosis: Chronic and unchanged.  Upon review of patient's chart, her total white blood cell count has been persistently elevated since at least November 2019 ranging between 10.6 and 27.5.  Her most recent result is 27.5. BCR-ABL mutation and peripheral blood flow cytometry did not reveal any significant abnormalities.  A  bone marrow biopsy is not necessary at this time.  Monitor.   Hemochromatosis: Patient is heterozygote.  Likely clinically insignificant.  Her most recent ferritin is 55.  Today's result is pending. Fractured leg/foot: Resolved. Patient reports her fractures are likely secondary to her distant history of polio. Weight loss: Unintentional.  Will get a CT of the chest, abdomen, and pelvis to further evaluate.  If negative, patient will follow-up in 3 months as above.  Patient expressed understanding and was in agreement with this plan. She also understands that She can call clinic at any time with any questions, concerns, or complaints.    Shellie Dials, MD 05/29/24 12:11 PM

## 2024-06-12 ENCOUNTER — Ambulatory Visit
Admission: RE | Admit: 2024-06-12 | Discharge: 2024-06-12 | Disposition: A | Discharge status: Home / Self Care | Source: Ambulatory Visit | Attending: Oncology | Admitting: Oncology

## 2024-06-12 DIAGNOSIS — R634 Abnormal weight loss: Secondary | ICD-10-CM | POA: Insufficient documentation

## 2024-06-12 DIAGNOSIS — D72829 Elevated white blood cell count, unspecified: Secondary | ICD-10-CM | POA: Diagnosis present

## 2024-06-12 DIAGNOSIS — D45 Polycythemia vera: Secondary | ICD-10-CM | POA: Insufficient documentation

## 2024-06-12 MED ORDER — IOHEXOL 240 MG/ML SOLN
50.0000 mL | Freq: Once | INTRAMUSCULAR | Status: AC | PRN
  Administered 2024-06-12: 50 mL via ORAL

## 2024-06-12 MED ORDER — IOHEXOL 300 MG/ML  SOLN
80.0000 mL | Freq: Once | INTRAMUSCULAR | Status: AC | PRN
  Administered 2024-06-12: 80 mL via INTRAVENOUS

## 2024-06-12 MED ORDER — IOHEXOL 240 MG/ML SOLN: 50.0000 mL | Freq: Once | INTRAMUSCULAR | Status: DC | PRN

## 2024-07-01 ENCOUNTER — Emergency Department
Admission: EM | Admit: 2024-07-01 | Discharge: 2024-07-02 | Disposition: A | Discharge status: Home / Self Care | Attending: Emergency Medicine | Admitting: Emergency Medicine

## 2024-07-01 ENCOUNTER — Other Ambulatory Visit: Payer: Self-pay

## 2024-07-01 ENCOUNTER — Emergency Department

## 2024-07-01 DIAGNOSIS — S41012A Laceration without foreign body of left shoulder, initial encounter: Secondary | ICD-10-CM | POA: Diagnosis not present

## 2024-07-01 DIAGNOSIS — Z7982 Long term (current) use of aspirin: Secondary | ICD-10-CM | POA: Insufficient documentation

## 2024-07-01 DIAGNOSIS — Y92019 Unspecified place in single-family (private) house as the place of occurrence of the external cause: Secondary | ICD-10-CM | POA: Diagnosis not present

## 2024-07-01 DIAGNOSIS — S4992XA Unspecified injury of left shoulder and upper arm, initial encounter: Secondary | ICD-10-CM | POA: Diagnosis present

## 2024-07-01 DIAGNOSIS — S0990XA Unspecified injury of head, initial encounter: Secondary | ICD-10-CM | POA: Insufficient documentation

## 2024-07-01 DIAGNOSIS — E039 Hypothyroidism, unspecified: Secondary | ICD-10-CM | POA: Diagnosis not present

## 2024-07-01 DIAGNOSIS — W19XXXA Unspecified fall, initial encounter: Secondary | ICD-10-CM | POA: Diagnosis not present

## 2024-07-01 MED ORDER — TETANUS-DIPHTH-ACELL PERTUSSIS 5-2.5-18.5 LF-MCG/0.5 IM SUSY
0.5000 mL | PREFILLED_SYRINGE | Freq: Once | INTRAMUSCULAR | Status: DC
  Filled 2024-07-01: qty 0.5

## 2024-07-01 MED ORDER — BACITRACIN ZINC 500 UNIT/GM EX OINT
TOPICAL_OINTMENT | Freq: Once | CUTANEOUS | Status: AC
  Administered 2024-07-02: 1 via TOPICAL
  Filled 2024-07-01: qty 0.9

## 2024-07-01 NOTE — ED Triage Notes (Signed)
 Patient to ED via [facility] from home. Patient got up out of bed to brush her teeth when she fell backwards and hit her head. Patient does have 2 knots on the back of her head a laceration on the back of her neck. Patient denies LOC and blood thinner use. Patient did take her prescription Hydrocodone  after the fall. EMS states all vitals were stable in route.

## 2024-07-01 NOTE — ED Provider Notes (Signed)
 [facility] Provider Note    Event Date/Time   First MD Initiated Contact with Patient 07/01/24 2324     (approximate)   History   Fall   HPI  Priscilla Berry is a 78 y.o. female with history of polycythemia vera, vitamin D  deficiency, polio, SVT, hypothyroidism who presents to the emergency department with EMS after she fell.  She states that it is not abnormal for her to feel very tired and have weakness in her legs at the end of the day secondary to her polio.  States she was brushing her teeth when her legs gave out and she fell.  She did hit her head and has a skin tear to the left posterior neck/shoulder area.  States her last tetanus vaccine was 10 years ago.  She denies any other injury.  No chest pain, shortness of breath, dizziness that led to her fall.  She states she took a hydrocodone  at home prior to arriving in the ED.   History provided by patient, family, EMS.    Past Medical History:  Diagnosis Date   Abnormality of gait 03/19/2016   Hyperthyroidism    Polio    PSVT (paroxysmal supraventricular tachycardia) (HCC)     Past Surgical History:  Procedure Laterality Date   BREAST BIOPSY Right 1999   EXCISIONAL - NEG   BREAST BIOPSY Right 2012   EXCISIONAL - NEG   BREAST SURGERY     breast biopsies   COLONOSCOPY WITH PROPOFOL  N/A 07/04/2015   Procedure: COLONOSCOPY WITH PROPOFOL ;  Surgeon: Lamar ONEIDA Holmes, MD;  Location: [facility] ENDOSCOPY;  Service: Endoscopy;  Laterality: N/A;   ELECTROPHYSIOLOGIC STUDY N/A 12/16/2015   Procedure: SVT Ablation;  Surgeon: Danelle ORN Birmingham, MD;  Location: [facility] INVASIVE CV LAB;  Service: Cardiovascular;  Laterality: N/A;   TIBIA IM NAIL INSERTION Right 09/09/2020   Procedure: INTRAMEDULLARY (IM) NAIL TIBIAL;  Surgeon: Kathlynn Sharper, MD;  Location: [facility] ORS;  Service: Orthopedics;  Laterality: Right;   TONSILLECTOMY      MEDICATIONS:  Prior to Admission medications   Medication Sig Start Date End Date  Taking? Authorizing Provider  ARMOUR THYROID  60 MG tablet Take 75 mg by mouth daily. 11/20/15   [provider]  aspirin  EC 325 MG tablet Take 1 tablet (325 mg total) by mouth daily. 09/11/20   Charlene Debby BROCKS, PA-C  cholecalciferol  (VITAMIN D ) 1000 units tablet Take 1,000 Units by mouth daily.    [provider]  Cyanocobalamin  1000 MCG TBCR Take 1,000 mcg by mouth daily.     [provider]  EPINEPHrine 0.3 mg/0.3 mL IJ SOAJ injection Inject into the muscle. 02/09/22   [provider]  Fish Oil -Cholecalciferol  (FISH OIL  + D3) 1000-1000 MG-UNIT CAPS Take 1,000 mg by mouth daily.     [provider]  HYDROcodone -acetaminophen  (NORCO/VICODIN) 5-325 MG tablet  10/19/21   [provider]  hydroxyurea  (HYDREA ) 500 MG capsule TAKE 1 CAPSULE BY MOUTH DAILY. MAY TAKE WITH FOOD TO MINIMIZE GASTROINTESTINAL SIDE EFFECTS. Patient not taking: Reported on 05/29/2024 12/27/22   Jacobo Evalene PARAS, MD  L-Theanine 100 MG CAPS Take 100 mg by mouth at bedtime as needed.     [provider]  Magnesium  200 MG TABS Take by mouth.    [provider]  Probiotic Product (PROBIOTIC DAILY PO) Take 1 tablet by mouth daily.    [provider]  Riboflavin  400 MG CAPS Take 400 mg by mouth daily.  [provider]    Physical Exam   Triage Vital Signs: ED Triage Vitals [07/01/24 2325]  Encounter Vitals Group     BP 133/77     Girls Systolic BP Percentile      Girls Diastolic BP Percentile      Boys Systolic BP Percentile      Boys Diastolic BP Percentile      Pulse Rate (!) 109     Resp (!) 22     Temp 97.7 F (36.5 C)     Temp Source Oral     SpO2 96 %     Weight 117 lb (53.1 kg)     Height 5' 7 (1.702 m)     Head Circumference      Peak Flow      Pain Score 4     Pain Loc      Pain Education      Exclude from Growth Chart     Most recent vital signs: Vitals:   07/01/24 2325 07/02/24 0017  BP: 133/77 130/83   Pulse: (!) 109 88  Resp: (!) 22 17  Temp: 97.7 F (36.5 C)   SpO2: 96% 95%     CONSTITUTIONAL: Alert, responds appropriately to questions. Well-appearing; well-nourished; GCS 15, elderly HEAD: Normocephalic; atraumatic EYES: Conjunctivae clear, PERRL, EOMI ENT: normal nose; no rhinorrhea; moist mucous membranes; pharynx without lesions noted; no dental injury; no septal hematoma, no epistaxis; no facial deformity or bony tenderness NECK: Supple, no midline spinal tenderness, step-off or deformity; trachea midline CARD: RRR; S1 and S2 appreciated; no murmurs, no clicks, no rubs, no gallops RESP: Normal chest excursion without splinting or tachypnea; breath sounds clear and equal bilaterally; no wheezes, no rhonchi, no rales; no hypoxia or respiratory distress CHEST:  chest wall stable, no crepitus or ecchymosis or deformity, nontender to palpation; no flail chest ABD/GI: Non-distended; soft, non-tender, no rebound, no guarding; no ecchymosis or other lesions noted PELVIS:  stable, nontender to palpation BACK:  The back appears normal; no midline spinal tenderness, step-off or deformity EXT: Normal ROM in all joints; no edema; normal capillary refill; no cyanosis, no bony tenderness or bony deformity of patient's extremities, no joint effusions, compartments are soft, extremities are warm and well-perfused, no ecchymosis SKIN: Normal color for age and race; warm, superficial abrasion/skin tear to the left posterior shoulder just lateral to the lower cervical spine NEURO: No facial asymmetry, normal speech, moving all extremities equally  ED Results / Procedures / Treatments   LABS: (all labs ordered are listed, but only abnormal results are displayed) Labs Reviewed - No data to display   EKG:    RADIOLOGY: My personal review and interpretation of imaging: CT head and cervical spine show no acute traumatic injury.  I have personally reviewed all radiology reports. CT HEAD WO  CONTRAST ( ) Result Date: 07/02/2024 CLINICAL DATA:  Head trauma, minor (Age >= 65y); Neck trauma (Age >= 65y) EXAM: CT HEAD WITHOUT CONTRAST CT CERVICAL SPINE WITHOUT CONTRAST TECHNIQUE: Multidetector CT imaging of the head and cervical spine was performed following the standard protocol without intravenous contrast. Multiplanar CT image reconstructions of the cervical spine were also generated. RADIATION DOSE REDUCTION: This exam was performed according to the departmental dose-optimization program which includes automated exposure control, adjustment of the mA and/or kV according to patient size and/or use of iterative reconstruction technique. COMPARISON:  None Available. FINDINGS: CT HEAD FINDINGS Brain: No evidence of acute infarction, hemorrhage, hydrocephalus, extra-axial collection or mass lesion/mass effect.  Moderate to advanced patchy white matter hypodensities are nonspecific but compatible with chronic microvascular ischemic change. Vascular: No hyperdense vessel. Skull: No acute fracture. Sinuses/Orbits: Clear sinuses.  No acute orbital findings. Other: No mastoid effusions. CT CERVICAL SPINE FINDINGS Alignment: No substantial sagittal subluxation. Skull base and vertebrae: No evidence of acute fracture. Vertebral body heights are maintained. Soft tissues and spinal canal: No prevertebral fluid or swelling. No visible canal hematoma. Disc levels:  Mild for age bony degenerative change. Upper chest: Visualized lung apices are clear. IMPRESSION: No acute findings intracranially or in the cervical spine. Electronically Signed   By: Gilmore GORMAN Molt M.D.   On: 07/02/2024 00:04   CT Cervical Spine Wo Contrast Result Date: 07/02/2024 CLINICAL DATA:  Head trauma, minor (Age >= 65y); Neck trauma (Age >= 65y) EXAM: CT HEAD WITHOUT CONTRAST CT CERVICAL SPINE WITHOUT CONTRAST TECHNIQUE: Multidetector CT imaging of the head and cervical spine was performed following the standard protocol without intravenous  contrast. Multiplanar CT image reconstructions of the cervical spine were also generated. RADIATION DOSE REDUCTION: This exam was performed according to the departmental dose-optimization program which includes automated exposure control, adjustment of the mA and/or kV according to patient size and/or use of iterative reconstruction technique. COMPARISON:  None Available. FINDINGS: CT HEAD FINDINGS Brain: No evidence of acute infarction, hemorrhage, hydrocephalus, extra-axial collection or mass lesion/mass effect. Moderate to advanced patchy white matter hypodensities are nonspecific but compatible with chronic microvascular ischemic change. Vascular: No hyperdense vessel. Skull: No acute fracture. Sinuses/Orbits: Clear sinuses.  No acute orbital findings. Other: No mastoid effusions. CT CERVICAL SPINE FINDINGS Alignment: No substantial sagittal subluxation. Skull base and vertebrae: No evidence of acute fracture. Vertebral body heights are maintained. Soft tissues and spinal canal: No prevertebral fluid or swelling. No visible canal hematoma. Disc levels:  Mild for age bony degenerative change. Upper chest: Visualized lung apices are clear. IMPRESSION: No acute findings intracranially or in the cervical spine. Electronically Signed   By: Gilmore GORMAN Molt M.D.   On: 07/02/2024 00:04     PROCEDURES:  Critical Care performed: No      Procedures    IMPRESSION / MDM / ASSESSMENT AND PLAN / ED COURSE  I reviewed the triage vital signs and the nursing notes.  Patient here after mechanical fall.  Did hit her head.  Has a skin tear.  No laceration that needs repair.     DIFFERENTIAL DIAGNOSIS (includes but not limited to):   Head injury, skull fracture, intracranial hemorrhage, cervical spine fracture, skin tear  Patient's presentation is most consistent with acute presentation with potential threat to life or bodily function.  PLAN: CT head and cervical spine ordered.  Offered tetanus vaccine  which patient declines.  Will clean wound, apply bacitracin  and sterile dressing.  No laceration that needs to be sutured.  No other sign of traumatic injury on exam.  At her neurologic baseline.  Hemodynamically stable.  She declines anything for pain here.   MEDICATIONS GIVEN IN ED: Medications  bacitracin  ointment (1 Application Topical Given 07/02/24 0009)     ED COURSE: CT scans reviewed and interpreted by myself and the radiologist and show no acute abnormality.  I feel she is safe to be discharged home with family.  She is comfortable with this plan.  Discussed wound care instructions.   At this time, I do not feel there is any life-threatening condition present. I reviewed all nursing notes, vitals, pertinent previous records.  All lab and urine results, EKGs, imaging  ordered have been independently reviewed and interpreted by myself.  I reviewed all available radiology reports from any imaging ordered this visit.  Based on my assessment, I feel the patient is safe to be discharged home without further emergent workup and can continue workup as an outpatient as needed. Discussed all findings, treatment plan as well as usual and customary return precautions.  They verbalize understanding and are comfortable with this plan.  Outpatient follow-up has been provided as needed.  All questions have been answered.    CONSULTS:  none   OUTSIDE RECORDS REVIEWED: Reviewed last hematology note on 05/29/2024.       FINAL CLINICAL IMPRESSION(S) / ED DIAGNOSES   Final diagnoses:  Fall, initial encounter  Injury of head, initial encounter     Rx / DC Orders   ED Discharge Orders     None        Note:  This document was prepared using Dragon voice recognition software and may include unintentional dictation errors.   Sterling Ucci, Josette SAILOR, DO 07/02/24 0830

## 2024-07-02 DIAGNOSIS — S41012A Laceration without foreign body of left shoulder, initial encounter: Secondary | ICD-10-CM | POA: Diagnosis not present

## 2024-07-02 NOTE — Discharge Instructions (Addendum)
 You may clean the wound to your left posterior shoulder with soap and warm water daily, pat dry and apply a dressing as needed.

## 2024-07-20 ENCOUNTER — Other Ambulatory Visit
Admission: RE | Admit: 2024-07-20 | Discharge: 2024-07-20 | Disposition: A | Discharge status: Home / Self Care | Source: Ambulatory Visit | Attending: Internal Medicine | Admitting: Internal Medicine

## 2024-07-20 DIAGNOSIS — D751 Secondary polycythemia: Secondary | ICD-10-CM | POA: Diagnosis present

## 2024-07-20 LAB — CBC WITH DIFFERENTIAL/PLATELET
Basophils Absolute: 0.3 K/uL — ABNORMAL HIGH (ref 0.0–0.1)
Basophils Relative: 1 %
Eosinophils Absolute: 0.9 K/uL — ABNORMAL HIGH (ref 0.0–0.5)
Eosinophils Relative: 3 %
HCT: 48.8 % — ABNORMAL HIGH (ref 36.0–46.0)
Hemoglobin: 14.4 g/dL (ref 12.0–15.0)
Lymphocytes Relative: 13 %
Lymphs Abs: 4 K/uL (ref 0.7–4.0)
MCH: 20.2 pg — ABNORMAL LOW (ref 26.0–34.0)
MCHC: 29.5 g/dL — ABNORMAL LOW (ref 30.0–36.0)
MCV: 68.4 fL — ABNORMAL LOW (ref 80.0–100.0)
Monocytes Absolute: 5.2 K/uL — ABNORMAL HIGH (ref 0.1–1.0)
Monocytes Relative: 17 %
Neutro Abs: 20.1 K/uL — ABNORMAL HIGH (ref 1.7–7.7)
Neutrophils Relative %: 66 %
Platelets: 301 K/uL (ref 150–400)
RBC: 7.13 MIL/uL — ABNORMAL HIGH (ref 3.87–5.11)
RDW: 21.7 % — ABNORMAL HIGH (ref 11.5–15.5)
Smear Review: NORMAL
WBC: 30.4 K/uL — ABNORMAL HIGH (ref 4.0–10.5)
nRBC: 2.6 % — ABNORMAL HIGH (ref 0.0–0.2)

## 2024-08-29 ENCOUNTER — Inpatient Hospital Stay (HOSPITAL_BASED_OUTPATIENT_CLINIC_OR_DEPARTMENT_OTHER): Admitting: Oncology

## 2024-08-29 ENCOUNTER — Encounter: Payer: Self-pay | Admitting: Oncology

## 2024-08-29 ENCOUNTER — Inpatient Hospital Stay: Attending: Oncology

## 2024-08-29 VITALS — BP 110/70 | HR 80 | Temp 97.9°F | Resp 18 | Ht 67.0 in | Wt 113.0 lb

## 2024-08-29 DIAGNOSIS — R634 Abnormal weight loss: Secondary | ICD-10-CM

## 2024-08-29 DIAGNOSIS — D72829 Elevated white blood cell count, unspecified: Secondary | ICD-10-CM

## 2024-08-29 DIAGNOSIS — D45 Polycythemia vera: Secondary | ICD-10-CM | POA: Diagnosis present

## 2024-08-29 DIAGNOSIS — R161 Splenomegaly, not elsewhere classified: Secondary | ICD-10-CM | POA: Diagnosis not present

## 2024-08-29 LAB — CBC WITH DIFFERENTIAL (CANCER CENTER ONLY)
Abs Immature Granulocytes: 5.69 K/uL — ABNORMAL HIGH (ref 0.00–0.07)
Basophils Absolute: 1.4 K/uL — ABNORMAL HIGH (ref 0.0–0.1)
Basophils Relative: 5 %
Eosinophils Absolute: 0.5 K/uL (ref 0.0–0.5)
Eosinophils Relative: 2 %
HCT: 49.1 % — ABNORMAL HIGH (ref 36.0–46.0)
Hemoglobin: 14.3 g/dL (ref 12.0–15.0)
Immature Granulocytes: 18 %
Lymphocytes Relative: 10 %
Lymphs Abs: 2.9 K/uL (ref 0.7–4.0)
MCH: 19.9 pg — ABNORMAL LOW (ref 26.0–34.0)
MCHC: 29.1 g/dL — ABNORMAL LOW (ref 30.0–36.0)
MCV: 68.2 fL — ABNORMAL LOW (ref 80.0–100.0)
Monocytes Absolute: 1.3 K/uL — ABNORMAL HIGH (ref 0.1–1.0)
Monocytes Relative: 4 %
Neutro Abs: 19.2 K/uL — ABNORMAL HIGH (ref 1.7–7.7)
Neutrophils Relative %: 61 %
Platelet Count: 313 K/uL (ref 150–400)
RBC: 7.2 MIL/uL — ABNORMAL HIGH (ref 3.87–5.11)
RDW: 22.3 % — ABNORMAL HIGH (ref 11.5–15.5)
WBC Count: 31 K/uL — ABNORMAL HIGH (ref 4.0–10.5)
nRBC: 2.1 % — ABNORMAL HIGH (ref 0.0–0.2)

## 2024-08-29 NOTE — Progress Notes (Signed)
 [facility]  [phone] [phone]  ID: Priscilla Berry Marina OB: 10/05/1946  MR#: [id number]  RDW#:[id number]  Patient Care Team: Auston Reyes BIRCH, MD as PCP - General (Internal Medicine) Jacobo Evalene PARAS, MD as Consulting Physician (Hematology and Oncology)   CHIEF COMPLAINT: JAK-2 positive, polycythemia vera, iron deficiency, heterozygous for hemochromatosis gene mutation H63D   INTERVAL HISTORY: Patient returns to clinic today for repeat laboratory work and further evaluation.  She has noticed increased fatigue recently, but attributes this to taking care of her husband who recently had a pacemaker and cardiac valve replacement.  She otherwise feels well.  She has no neurologic complaints.  She has a good appetite and denies weight loss.  She denies any fevers, night sweats, or weight loss.  She denies any chest pain, shortness of breath, cough, or hemoptysis.  She denies any nausea, vomiting, constipation, or diarrhea. She has no urinary complaints.  Patient offers no further specific complaints today.  REVIEW OF SYSTEMS:   Review of Systems  Constitutional:  Positive for malaise/fatigue. Negative for fever and weight loss.  HENT: Negative.  Negative for congestion and sinus pain.   Respiratory: Negative.  Negative for cough and shortness of breath.   Cardiovascular: Negative.  Negative for chest pain and leg swelling.  Gastrointestinal: Negative.  Negative for abdominal pain, constipation, diarrhea, nausea and vomiting.  Genitourinary: Negative.  Negative for dysuria and urgency.  Musculoskeletal: Negative.  Negative for back pain.  Neurological: Negative.  Negative for dizziness, weakness and headaches.  Endo/Heme/Allergies:  Does not bruise/bleed easily.  Psychiatric/Behavioral: Negative.  The patient is not nervous/anxious and does not have insomnia.     As per HPI. Otherwise, a complete review of systems is negative.  PAST MEDICAL  HISTORY: Past Medical History:  Diagnosis Date   Abnormality of gait 03/19/2016   Hyperthyroidism    Polio    PSVT (paroxysmal supraventricular tachycardia) (HCC)     PAST SURGICAL HISTORY: Past Surgical History:  Procedure Laterality Date   BREAST BIOPSY Right 1999   EXCISIONAL - NEG   BREAST BIOPSY Right 2012   EXCISIONAL - NEG   BREAST SURGERY     breast biopsies   COLONOSCOPY WITH PROPOFOL  N/A 07/04/2015   Procedure: COLONOSCOPY WITH PROPOFOL ;  Surgeon: Lamar ONEIDA Holmes, MD;  Location: [facility] ENDOSCOPY;  Service: Endoscopy;  Laterality: N/A;   ELECTROPHYSIOLOGIC STUDY N/A 12/16/2015   Procedure: SVT Ablation;  Surgeon: Danelle Berry Birmingham, MD;  Location: [facility] INVASIVE CV LAB;  Service: Cardiovascular;  Laterality: N/A;   TIBIA IM NAIL INSERTION Right 09/09/2020   Procedure: INTRAMEDULLARY (IM) NAIL TIBIAL;  Surgeon: Kathlynn Sharper, MD;  Location: [facility] ORS;  Service: Orthopedics;  Laterality: Right;   TONSILLECTOMY      FAMILY HISTORY: Reviewed and unchanged. No reported history of malignancy or chronic disease.     ADVANCED DIRECTIVES:    HEALTH MAINTENANCE: Social History   Tobacco Use   Smoking status: Never   Smokeless tobacco: Never  Substance Use Topics   Alcohol use: No     Colonoscopy:  PAP:  Bone density:  Lipid panel:  Allergies  Allergen Reactions   Bee Venom Anaphylaxis   Caffeine     Fever, chills, withdrawal symptoms    Current Outpatient Medications  Medication Sig Dispense Refill   ARMOUR THYROID  60 MG tablet Take 75 mg by mouth daily.  4   aspirin  EC 325 MG tablet Take 1 tablet (325 mg total) by mouth daily. 30 tablet 0  cholecalciferol  (VITAMIN D ) 1000 units tablet Take 1,000 Units by mouth daily.     Cyanocobalamin  1000 MCG TBCR Take 1,000 mcg by mouth daily.      EPINEPHrine 0.3 mg/0.3 mL IJ SOAJ injection Inject into the muscle.     Fish Oil -Cholecalciferol  (FISH OIL  + D3) 1000-1000 MG-UNIT CAPS Take 1,000 mg by mouth daily.       HYDROcodone -acetaminophen  (NORCO/VICODIN) 5-325 MG tablet      hydroxyurea  (HYDREA ) 500 MG capsule TAKE 1 CAPSULE BY MOUTH DAILY. MAY TAKE WITH FOOD TO MINIMIZE GASTROINTESTINAL SIDE EFFECTS. 90 capsule 3   L-Theanine 100 MG CAPS Take 100 mg by mouth at bedtime as needed.      Magnesium  200 MG TABS Take by mouth.     Probiotic Product (PROBIOTIC DAILY PO) Take 1 tablet by mouth daily.     Riboflavin  400 MG CAPS Take 400 mg by mouth daily.      No current facility-administered medications for this visit.    OBJECTIVE: Vitals:   08/29/24 1051  BP: 110/70  Pulse: 80  Resp: 18  Temp: 97.9 F (36.6 C)  SpO2: 96%     Body mass index is 17.7 kg/m.    ECOG FS:1 - Symptomatic but completely ambulatory   General: Well-developed, well-nourished, no acute distress.  Sitting in a wheelchair. Eyes: Pink conjunctiva, anicteric sclera. HEENT: Normocephalic, moist mucous membranes. Lungs: No audible wheezing or coughing. Heart: Regular rate and rhythm. Abdomen: Soft, nontender, no obvious distention. Musculoskeletal: No edema, cyanosis, or clubbing. Neuro: Alert, answering all questions appropriately. Cranial nerves grossly intact. Skin: No rashes or petechiae noted. Psych: Normal affect.   LAB RESULTS:  Lab Results  Component Value Date   NA 135 02/27/2024   K 4.2 02/27/2024   CL 103 02/27/2024   CO2 24 02/27/2024   GLUCOSE 102 (H) 02/27/2024   BUN 35 (H) 02/27/2024   CREATININE 0.57 02/27/2024   CALCIUM  8.9 02/27/2024   PROT 6.7 02/27/2024   ALBUMIN 3.8 02/27/2024   AST 34 02/27/2024   ALT 17 02/27/2024   ALKPHOS 120 02/27/2024   BILITOT 1.1 02/27/2024   GFRNONAA >60 02/27/2024   GFRAA >60 09/10/2020    Lab Results  Component Value Date   WBC 31.0 (H) 08/29/2024   NEUTROABS PENDING 08/29/2024   HGB 14.3 08/29/2024   HCT 49.1 (H) 08/29/2024   MCV 68.2 (L) 08/29/2024   PLT 313 08/29/2024   Lab Results  Component Value Date   IRON 17 (L) 05/29/2024   TIBC 477 (H)  05/29/2024   IRONPCTSAT 4 (L) 05/29/2024    Lab Results  Component Value Date   FERRITIN 17 05/29/2024     STUDIES: No results found.  ASSESSMENT: JAK-2 positive, polycythemia vera, iron deficiency, heterozygous for hemochromatosis gene mutation H63D   PLAN:    Polycythemia vera: JAK-2 mutation is positive. She does not require phlebotomy, but would consider in the future if necessary.  Since discontinuing Hydrea  in May 2024, patient is no longer anemic and her hemoglobin has remained relatively stable at 14.3.  Continue to hold Hydrea .  Return to clinic in 3 months with repeat laboratory and further evaluation.  Leukocytosis: Slowly trending up.  Upon review of patient's chart, her total white blood cell count has been persistently elevated since at least November 2019 ranging between 10.6 and 27.5.  Although since discontinuing Hydrea  in May 2024 her total white blood cell count has trended up from 15.1-31.0.  She also has noted to have significant  splenomegaly of 24.5 cm on CT scan from June 12, 2024.  We discussed possible bone marrow biopsy, but patient wishes to delay and will reconsider in 3 months.  Previously, BCR-ABL mutation and peripheral blood flow cytometry did not reveal any significant abnormalities.   Hemochromatosis: Patient is heterozygote.  Likely clinically insignificant.  Her most recent ferritin is 17. Fractured leg/foot: Resolved. Patient reports her fractures are likely secondary to her distant history of polio. Weight loss: Possibly secondary to early satiety from splenomegaly.  Patient expressed understanding and was in agreement with this plan. She also understands that She can call clinic at any time with any questions, concerns, or complaints.    Evalene JINNY Reusing, MD 08/29/24 10:58 AM

## 2024-08-29 NOTE — Progress Notes (Signed)
 Patient is doing well, no new questions for the doctor today. She did tell me that she is feeling tired due to having to go back and forth to hospital for her husband whom just had a pace maker placed and a valve replaced.

## 2024-09-11 ENCOUNTER — Other Ambulatory Visit: Payer: Self-pay

## 2024-09-11 ENCOUNTER — Telehealth: Payer: Self-pay | Admitting: *Deleted

## 2024-09-11 DIAGNOSIS — D72829 Elevated white blood cell count, unspecified: Secondary | ICD-10-CM

## 2024-09-11 DIAGNOSIS — D45 Polycythemia vera: Secondary | ICD-10-CM

## 2024-09-11 NOTE — Telephone Encounter (Signed)
 Patient called because she got a call from her PCP and they did some labs and her her ferritin was low.  PCP suggested that she get some iron.  The patient remembers that Dr. Calista had her ferritin low.  ACP would like to put her on be liver supplements and if that is okay how many tablets a day.  Patient cannot remember her PCP.   The patient n the past patient would not get anything iron because Dr. Jacobo is her for leukocytosis and polycythemia vera.

## 2024-10-05 ENCOUNTER — Encounter: Payer: Self-pay | Admitting: Physician Assistant

## 2024-10-19 ENCOUNTER — Encounter: Payer: Self-pay | Admitting: Oncology

## 2024-11-28 ENCOUNTER — Encounter: Payer: Self-pay | Admitting: Oncology

## 2024-11-28 ENCOUNTER — Inpatient Hospital Stay: Attending: Oncology

## 2024-11-28 ENCOUNTER — Inpatient Hospital Stay: Admitting: Oncology

## 2024-11-28 VITALS — BP 112/68 | HR 97 | Temp 97.3°F | Resp 16 | Ht 67.0 in | Wt 113.0 lb

## 2024-11-28 DIAGNOSIS — D72829 Elevated white blood cell count, unspecified: Secondary | ICD-10-CM | POA: Diagnosis not present

## 2024-11-28 DIAGNOSIS — D45 Polycythemia vera: Secondary | ICD-10-CM | POA: Diagnosis not present

## 2024-11-28 DIAGNOSIS — R161 Splenomegaly, not elsewhere classified: Secondary | ICD-10-CM | POA: Diagnosis not present

## 2024-11-28 LAB — CBC WITH DIFFERENTIAL/PLATELET
Abs Immature Granulocytes: 7.86 K/uL — ABNORMAL HIGH (ref 0.00–0.07)
Basophils Absolute: 2.1 K/uL — ABNORMAL HIGH (ref 0.0–0.1)
Basophils Relative: 5 %
Eosinophils Absolute: 0.4 K/uL (ref 0.0–0.5)
Eosinophils Relative: 1 %
HCT: 50.3 % — ABNORMAL HIGH (ref 36.0–46.0)
Hemoglobin: 14.5 g/dL (ref 12.0–15.0)
Immature Granulocytes: 20 %
Lymphocytes Relative: 8 %
Lymphs Abs: 3.1 K/uL (ref 0.7–4.0)
MCH: 19.2 pg — ABNORMAL LOW (ref 26.0–34.0)
MCHC: 28.8 g/dL — ABNORMAL LOW (ref 30.0–36.0)
MCV: 66.6 fL — ABNORMAL LOW (ref 80.0–100.0)
Monocytes Absolute: 2.1 K/uL — ABNORMAL HIGH (ref 0.1–1.0)
Monocytes Relative: 5 %
Neutro Abs: 24.4 K/uL — ABNORMAL HIGH (ref 1.7–7.7)
Neutrophils Relative %: 61 %
Platelets: 312 K/uL (ref 150–400)
RBC: 7.55 MIL/uL — ABNORMAL HIGH (ref 3.87–5.11)
RDW: 23 % — ABNORMAL HIGH (ref 11.5–15.5)
WBC: 39.9 K/uL — ABNORMAL HIGH (ref 4.0–10.5)
nRBC: 2.6 % — ABNORMAL HIGH (ref 0.0–0.2)

## 2024-11-28 LAB — IRON AND TIBC
Iron: 13 ug/dL — ABNORMAL LOW (ref 28–170)
Saturation Ratios: 3 % — ABNORMAL LOW (ref 10.4–31.8)
TIBC: 433 ug/dL (ref 250–450)
UIBC: 420 ug/dL

## 2024-11-28 LAB — FERRITIN: Ferritin: 23 ng/mL (ref 11–307)

## 2024-11-28 NOTE — Progress Notes (Signed)
 [facility]  [phone]) [phone] [phone]) [phone]  ID: Priscilla Berry OB: 10/14/1946  MR#: [id number]  RDW#:[id number]  Patient Care Team: Auston Reyes BIRCH, MD as PCP - General (Internal Medicine) Jacobo Evalene PARAS, MD as Consulting Physician (Hematology and Oncology)   CHIEF COMPLAINT: JAK-2 positive, polycythemia vera, iron deficiency, heterozygous for hemochromatosis gene mutation H63D   INTERVAL HISTORY: Patient returns to clinic today for repeat laboratory work and further evaluation.  She currently feels well and is asymptomatic.  She does not complain of fatigue today.  She has no neurologic complaints.  She has a good appetite and denies weight loss.  She denies any fevers, night sweats, or weight loss.  She denies any chest pain, shortness of breath, cough, or hemoptysis.  She denies any nausea, vomiting, constipation, or diarrhea. She has no urinary complaints.  Patient offers no specific complaints today.  REVIEW OF SYSTEMS:   Review of Systems  Constitutional: Negative.  Negative for fever, malaise/fatigue and weight loss.  HENT: Negative.  Negative for congestion and sinus pain.   Respiratory: Negative.  Negative for cough and shortness of breath.   Cardiovascular: Negative.  Negative for chest pain and leg swelling.  Gastrointestinal: Negative.  Negative for abdominal pain, constipation, diarrhea, nausea and vomiting.  Genitourinary: Negative.  Negative for dysuria and urgency.  Musculoskeletal: Negative.  Negative for back pain.  Neurological: Negative.  Negative for dizziness, weakness and headaches.  Endo/Heme/Allergies:  Does not bruise/bleed easily.  Psychiatric/Behavioral: Negative.  The patient is not nervous/anxious and does not have insomnia.     As per HPI. Otherwise, a complete review of systems is negative.  PAST MEDICAL HISTORY: Past Medical History:  Diagnosis Date   Abnormality of gait 03/19/2016   Hyperthyroidism     Polio    PSVT (paroxysmal supraventricular tachycardia)     PAST SURGICAL HISTORY: Past Surgical History:  Procedure Laterality Date   BREAST BIOPSY Right 1999   EXCISIONAL - NEG   BREAST BIOPSY Right 2012   EXCISIONAL - NEG   BREAST SURGERY     breast biopsies   COLONOSCOPY WITH PROPOFOL  N/A 07/04/2015   Procedure: COLONOSCOPY WITH PROPOFOL ;  Surgeon: Lamar ONEIDA Holmes, MD;  Location: [facility] ENDOSCOPY;  Service: Endoscopy;  Laterality: N/A;   ELECTROPHYSIOLOGIC STUDY N/A 12/16/2015   Procedure: SVT Ablation;  Surgeon: Danelle LELON Birmingham, MD;  Location: [facility] INVASIVE CV LAB;  Service: Cardiovascular;  Laterality: N/A;   TIBIA IM NAIL INSERTION Right 09/09/2020   Procedure: INTRAMEDULLARY (IM) NAIL TIBIAL;  Surgeon: Kathlynn Sharper, MD;  Location: [facility] ORS;  Service: Orthopedics;  Laterality: Right;   TONSILLECTOMY      FAMILY HISTORY: Reviewed and unchanged. No reported history of malignancy or chronic disease.     ADVANCED DIRECTIVES:    HEALTH MAINTENANCE: Social History   Tobacco Use   Smoking status: Never   Smokeless tobacco: Never  Substance Use Topics   Alcohol use: No     Colonoscopy:  PAP:  Bone density:  Lipid panel:  Allergies  Allergen Reactions   Bee Venom Anaphylaxis   Caffeine     Fever, chills, withdrawal symptoms    Current Outpatient Medications  Medication Sig Dispense Refill   ARMOUR THYROID  60 MG tablet Take 75 mg by mouth daily.  4   aspirin  EC 325 MG tablet Take 1 tablet (325 mg total) by mouth daily. 30 tablet 0   cholecalciferol  (VITAMIN D ) 1000 units tablet Take 1,000 Units by mouth daily.  EPINEPHrine 0.3 mg/0.3 mL IJ SOAJ injection Inject into the muscle.     L-Theanine 100 MG CAPS Take 100 mg by mouth at bedtime as needed.      Magnesium  200 MG TABS Take by mouth.     Probiotic Product (PROBIOTIC DAILY PO) Take 1 tablet by mouth daily.     Riboflavin  400 MG CAPS Take 400 mg by mouth daily.      Cyanocobalamin  1000 MCG TBCR Take 1,000 mcg  by mouth daily.  (Patient not taking: Reported on 11/28/2024)     Fish Oil -Cholecalciferol  (FISH OIL  + D3) 1000-1000 MG-UNIT CAPS Take 1,000 mg by mouth daily.  (Patient not taking: Reported on 11/28/2024)     HYDROcodone -acetaminophen  (NORCO/VICODIN) 5-325 MG tablet  (Patient not taking: Reported on 11/28/2024)     hydroxyurea  (HYDREA ) 500 MG capsule TAKE 1 CAPSULE BY MOUTH DAILY. MAY TAKE WITH FOOD TO MINIMIZE GASTROINTESTINAL SIDE EFFECTS. (Patient not taking: Reported on 11/28/2024) 90 capsule 3   No current facility-administered medications for this visit.    OBJECTIVE: Vitals:   11/28/24 1037  BP: 112/68  Pulse: 97  Resp: 16  Temp: (!) 97.3 F (36.3 C)  SpO2: 99%     Body mass index is 17.7 kg/m.    ECOG FS:1 - Symptomatic but completely ambulatory   General: Well-developed, well-nourished, no acute distress.  Sitting in wheelchair. Eyes: Pink conjunctiva, anicteric sclera. HEENT: Normocephalic, moist mucous membranes. Lungs: No audible wheezing or coughing. Heart: Regular rate and rhythm. Abdomen: Soft, nontender, no obvious distention. Musculoskeletal: No edema, cyanosis, or clubbing. Neuro: Alert, answering all questions appropriately. Cranial nerves grossly intact. Skin: No rashes or petechiae noted. Psych: Normal affect.  LAB RESULTS:  Lab Results  Component Value Date   NA 135 02/27/2024   K 4.2 02/27/2024   CL 103 02/27/2024   CO2 24 02/27/2024   GLUCOSE 102 (H) 02/27/2024   BUN 35 (H) 02/27/2024   CREATININE 0.57 02/27/2024   CALCIUM  8.9 02/27/2024   PROT 6.7 02/27/2024   ALBUMIN 3.8 02/27/2024   AST 34 02/27/2024   ALT 17 02/27/2024   ALKPHOS 120 02/27/2024   BILITOT 1.1 02/27/2024   GFRNONAA >60 02/27/2024   GFRAA >60 09/10/2020    Lab Results  Component Value Date   WBC 39.9 (H) 11/28/2024   NEUTROABS 24.4 (H) 11/28/2024   HGB 14.5 11/28/2024   HCT 50.3 (H) 11/28/2024   MCV 66.6 (L) 11/28/2024   PLT 312 11/28/2024   Lab Results   Component Value Date   IRON 13 (L) 11/28/2024   TIBC 433 11/28/2024   IRONPCTSAT 3 (L) 11/28/2024    Lab Results  Component Value Date   FERRITIN 23 11/28/2024     STUDIES: No results found.  ASSESSMENT: JAK-2 positive, polycythemia vera, iron deficiency, heterozygous for hemochromatosis gene mutation H63D   PLAN:    Polycythemia vera: JAK-2 mutation is positive. She does not require phlebotomy, but would consider in the future if necessary.  Since discontinuing Hydrea  in May 2024, patient is no longer anemic and her hemoglobin has remained relatively stable at 14.5. She also has noted to have significant splenomegaly of 24.5 cm on CT scan from June 12, 2024. Continue to hold Hydrea .  Return to clinic in 3 months with repeat laboratory and further evaluation.   Leukocytosis: Slowly trending up.  Upon review of patient's chart, her total white blood cell count has been persistently elevated since at least November 2019 ranging between 10.6 and 27.5.  Although  since discontinuing Hydrea  in May 2024 her total white blood cell count has trended up from 15.1-39.9 today.  Patient may need bone marrow biopsy in the future.  Previously, BCR-ABL mutation and peripheral blood flow cytometry did not reveal any significant abnormalities.  She may need to restart Hydrea , but this is not necessary at this point.  Follow-up as above. Hemochromatosis: Patient is heterozygote.  Likely clinically insignificant.  Her most recent ferritin is 23. Iron deficiency/microcytosis: Patient has a decreased total iron and iron saturation ratio.  No intervention is needed at this time. Fractured leg/foot: Resolved. Patient reports her fractures are likely secondary to her distant history of polio.  Patient expressed understanding and was in agreement with this plan. She also understands that She can call clinic at any time with any questions, concerns, or complaints.    Evalene JINNY Reusing, MD 11/28/24 11:20 AM

## 2024-11-28 NOTE — Progress Notes (Signed)
 Patient is doing ok, feeling more tired and fatigued.

## 2024-12-03 NOTE — Progress Notes (Unsigned)
 12/05/2024 Priscilla Berry Priscilla Berry [id number] 18-Jul-1946  Referring provider: Auston Reyes BIRCH, MD Primary GI doctor: Dr. San  ASSESSMENT AND PLAN:  Oct 2024 symptoms started with weight loss, AB bloating/enlargement, weight is now stable Denies appetite changes, bowel changes Follows with functional doctor in [place], had extensive testing there with low thyroid  06/12/2024 CT chest abdomen pelvis with contrast marked splenomegaly with scattered hypodensities concerning for lymphoproliferative process, hepatomegaly scattered tiny pulmonary nodules measuring 5 mm, age-indeterminate superior endplate compression deformity of T10 vertebral body, colonic diverticulosis without diverticulitis Abdominal distension and constipation likely due to pelvic floor dysfunction and splenomegaly Multifactorial etiology involving pelvic floor dysfunction, deconditioning, and splenomegaly. No acute obstruction or alarming features. Colonoscopy contraindicated due to splenomegaly. - Provided education on constipation, pelvic floor dysfunction, and dietary fiber. - Recommended increased dietary fiber intake: beans, apples, high-fiber breads. - Ordered abdominal x-ray to assess stool burden and evaluate distension. - Referred to pelvic floor physical therapy for evaluation and management. - Deferred repeat SIBO testing due to lack of symptoms and prior negative results. - Discussed virtual colonoscopy if weight loss recurs or anemia persists. - Scheduled follow-up in 2 months to reassess symptoms and interventions.  Colonoscopy 07/04/2015 colonoscopy with Dr. Viktoria good preparation 5 diminutive polyp sigmoid colon diverticulosis sigmoid descending colon internal hemorrhoids Recall 7 to 10 years Declines evaluation, with her spleen so enlarged   History of polycythemia vera, JAK2 positive, heterozygous for hemochromatosis gene mutation H63D  IDA intermittently since 2023, gets IV iron, negative FOBT  in the office an with primary care 11/28/2024  HGB 14.5 MCV 66.6 Platelets 312 11/28/2024 Iron 13 Ferritin 23  Recent Labs    02/27/24 1016 05/29/24 1032 07/20/24 1646 08/29/24 1031 11/28/24 1009  HGB 14.5 14.0 14.4 14.3 14.5  Consider virtual colonoscopy/EGD next OV    Patient Care Team: Auston Reyes BIRCH, MD as PCP - General (Internal Medicine) Jacobo Evalene PARAS, MD as Consulting Physician (Hematology and Oncology)  HISTORY OF PRESENT ILLNESS: 78 y.o. female with a past medical history listed below presents for evaluation of AB swelling.   Discussed the use of AI scribe software for clinical note transcription with the patient, who gave verbal consent to proceed.  History of Present Illness   Priscilla Berry is a 78 year old female with polycythemia vera, Hashimoto thyroiditis, and sequelae of poliomyelitis who presents for evaluation of chronic abdominal distension and constipation.  Persistent abdominal distension since October 2024, described as a hard, swollen abdomen that does not fluctuate with eating, bowel movements, or time of day. Distension is consistently present, not associated with abdominal pain. Family members describe her abdomen as looking pregnant. Initial significant weight loss from 135 lbs to 113 lbs over the past year, with recent stabilization. Appetite remains good, with three meals daily and preference for eating every two hours. Able to eat full meals, though unable to eat as much at one time as previously. Denies early satiety, nausea, vomiting, heartburn, reflux, or dysphagia.  Bowel habits are regular, with daily or twice daily soft stools and a sense of complete evacuation. Denies diarrhea, constipation, melena, or hematochezia. No excessive burping, belching, or gas. For several months after symptom onset, did not pass gas, but recently began passing small amounts. Extensive stool testing, including a mailed sample to a lab in [place], was  normal with no evidence of parasites or blood.  Urinary frequency is present, including nocturia, and she uses a pad for urinary incontinence. Experiences sensations suggestive of  urinary tract infection, for which she takes oregano and clove supplements. Began D-mannose after Thanksgiving 2025, which she feels has helped her symptoms. No fecal incontinence.  Followed by hematology for polycythemia vera with chronic splenomegaly and elevated white blood cell count. Previously required a cancer medication for her blood disorder but has discontinued it. History of low iron and ferritin, with prior iron transfusions. Advised not to take oral iron. Avoids cooking with cast iron and not eating red meat due to her husbands's alpha-gal allergy.  Hashimoto thyroiditis, newly diagnosed, and history of reactivated Epstein-Barr virus. Takes vitamin C, a multivitamin, and other supplements, and feels improved with these changes.  SIBO testing, urine testing, and extensive microbiome analysis were normal, with confirmed nutrient absorption. CT scan of the chest, abdomen, and pelvis in June 2025 showed splenomegaly. CT scans of the head and cervical spine in July 2025 after a fall revealed arthritis and age-related microvascular changes. Last colonoscopy was in 2016, which she found uncomfortable and experienced post-procedure symptoms.  History of polio and use of leg braces for over 24 years. Since a leg fracture in September 2021 and placement of a titanium rod, she primarily uses a wheelchair but remains active at home, standing for household tasks and walking short distances. Eats organic foods for over 24 years and prefers Ezekiel bread.      She  reports that she has never smoked. She has never used smokeless tobacco. She reports that she does not drink alcohol. No history on file for drug use.  RELEVANT GI HISTORY, IMAGING AND LABS: Results   Labs Ferritin (2023): Low Iron (2023): Low WBC:  Elevated  Radiology CT chest, abdomen, pelvis (05/2024): Splenomegaly; no lymphadenopathy; no abnormal findings in abdomen or pelvis CT head (07/01/2024): Ischemic microvascular changes; no hemorrhage; no fracture; no acute abnormality CT cervical spine (07/01/2024): Arthritis; no fracture; no acute abnormality      CBC    Component Value Date/Time   WBC 39.9 (H) 11/28/2024 1009   RBC 7.55 (H) 11/28/2024 1009   HGB 14.5 11/28/2024 1009   HGB 14.3 08/29/2024 1031   HCT 50.3 (H) 11/28/2024 1009   PLT 312 11/28/2024 1009   PLT 313 08/29/2024 1031   MCV 66.6 (L) 11/28/2024 1009   MCH 19.2 (L) 11/28/2024 1009   MCHC 28.8 (L) 11/28/2024 1009   RDW 23.0 (H) 11/28/2024 1009   LYMPHSABS 3.1 11/28/2024 1009   MONOABS 2.1 (H) 11/28/2024 1009   EOSABS 0.4 11/28/2024 1009   BASOSABS 2.1 (H) 11/28/2024 1009   Recent Labs    02/27/24 1016 05/29/24 1032 07/20/24 1646 08/29/24 1031 11/28/24 1009  HGB 14.5 14.0 14.4 14.3 14.5    CMP     Component Value Date/Time   NA 135 02/27/2024 1016   K 4.2 02/27/2024 1016   CL 103 02/27/2024 1016   CO2 24 02/27/2024 1016   GLUCOSE 102 (H) 02/27/2024 1016   BUN 35 (H) 02/27/2024 1016   CREATININE 0.57 02/27/2024 1016   CREATININE 0.62 12/10/2015 1128   CALCIUM  8.9 02/27/2024 1016   PROT 6.7 02/27/2024 1016   ALBUMIN 3.8 02/27/2024 1016   AST 34 02/27/2024 1016   ALT 17 02/27/2024 1016   ALKPHOS 120 02/27/2024 1016   BILITOT 1.1 02/27/2024 1016   GFRNONAA >60 02/27/2024 1016   GFRAA >60 09/10/2020 0426      Latest Ref Rng & Units 02/27/2024   10:16 AM 11/21/2023   10:38 AM 08/19/2023   10:57 AM  Hepatic Function  Total Protein 6.5 - 8.1 g/dL 6.7  6.4  6.8   Albumin 3.5 - 5.0 g/dL 3.8  3.7  3.8   AST 15 - 41 U/L 34  38  35   ALT 0 - 44 U/L 17  21  19    Alk Phosphatase 38 - 126 U/L 120  129  128   Total Bilirubin 0.0 - 1.2 mg/dL 1.1  1.1  0.9       Current Medications:   Current Outpatient Medications (Endocrine & Metabolic):     ARMOUR THYROID  60 MG tablet, Take 75 mg by mouth daily.  Current Outpatient Medications (Cardiovascular):    EPINEPHrine 0.3 mg/0.3 mL IJ SOAJ injection, Inject into the muscle.  Current Outpatient Medications (Analgesics):    aspirin  EC 325 MG tablet, Take 1 tablet (325 mg total) by mouth daily.   HYDROcodone -acetaminophen  (NORCO/VICODIN) 5-325 MG tablet,   Current Outpatient Medications (Hematological):    Cyanocobalamin  1000 MCG TBCR, Take 1,000 mcg by mouth daily.   Current Outpatient Medications (Other):    cholecalciferol  (VITAMIN D ) 1000 units tablet, Take 1,000 Units by mouth daily.   L-Theanine 100 MG CAPS, Take 100 mg by mouth at bedtime as needed.    Magnesium  200 MG TABS, Take by mouth.   Probiotic Product (PROBIOTIC DAILY PO), Take 1 tablet by mouth daily.   Riboflavin  400 MG CAPS, Take 400 mg by mouth daily.    Fish Oil -Cholecalciferol  (FISH OIL  + D3) 1000-1000 MG-UNIT CAPS, Take 1,000 mg by mouth daily.  (Patient not taking: Reported on 11/28/2024)   hydroxyurea  (HYDREA ) 500 MG capsule, TAKE 1 CAPSULE BY MOUTH DAILY. MAY TAKE WITH FOOD TO MINIMIZE GASTROINTESTINAL SIDE EFFECTS. (Patient not taking: Reported on 12/05/2024)  Medical History:  Past Medical History:  Diagnosis Date   Abnormality of gait 03/19/2016   Hyperthyroidism    Polio    PSVT (paroxysmal supraventricular tachycardia)    Allergies: Allergies[1]   Surgical History:  She  has a past surgical history that includes Breast surgery; Tonsillectomy; Colonoscopy with propofol  (N/A, 07/04/2015); Cardiac catheterization (N/A, 12/16/2015); Breast biopsy (Right, 1999); Breast biopsy (Right, 2012); and Tibia IM nail insertion (Right, 09/09/2020). Family History:  Her family history is not on file.  REVIEW OF SYSTEMS  : All other systems reviewed and negative except where noted in the History of Present Illness.  PHYSICAL EXAM: BP 112/64   Pulse 80   Ht 5' 7 (1.702 m)   Wt 113 lb (51.3 kg)   SpO2 97%   BMI  17.70 kg/m  Physical Exam   MEASUREMENTS: Height- 5'7, Weight- 113. GENERAL APPEARANCE: Well nourished, in no apparent distress. HEENT: No cervical lymphadenopathy, unremarkable thyroid , sclerae anicteric, conjunctiva pink. RESPIRATORY: Respiratory effort normal, breath sounds equal bilaterally without rales, rhonchi, or wheezing. CARDIO: Regular rate and rhythm with no murmurs, rubs, or gallops, peripheral pulses intact. ABDOMEN: hard distended on left AB with spleen enlargement, soft on right AB, decreased bowel sounds in upper abdomen, present in right lower abdomen, no tenderness to palpation, no rebound RECTAL: No rectal pain, no red blood, small internal hemorrhoid on left side, soft brown stool present, marked amount of stool with some hard pieces. MUSCULOSKELETAL: Full range of motion, antalgic gait with braces on ankles, in wheelchair but able to get on table with some assistance, without edema. SKIN: Dry, intact without rashes or lesions. No jaundice. NEURO: Alert, oriented, no focal deficits. PSYCH: Cooperative, normal mood and affect.      Alan JONELLE Coombs, PA-C 12:13  PM      [1]  Allergies Allergen Reactions   Bee Venom Anaphylaxis   Caffeine     Fever, chills, withdrawal symptoms

## 2024-12-05 ENCOUNTER — Ambulatory Visit
Admission: RE | Admit: 2024-12-05 | Discharge: 2024-12-05 | Disposition: A | Discharge status: Home / Self Care | Source: Ambulatory Visit | Attending: Physician Assistant | Admitting: Physician Assistant

## 2024-12-05 ENCOUNTER — Ambulatory Visit: Admitting: Physician Assistant

## 2024-12-05 ENCOUNTER — Encounter: Payer: Self-pay | Admitting: Physician Assistant

## 2024-12-05 VITALS — BP 112/64 | HR 80 | Ht 67.0 in | Wt 113.0 lb

## 2024-12-05 DIAGNOSIS — R32 Unspecified urinary incontinence: Secondary | ICD-10-CM

## 2024-12-05 DIAGNOSIS — R1900 Intra-abdominal and pelvic swelling, mass and lump, unspecified site: Secondary | ICD-10-CM

## 2024-12-05 NOTE — Patient Instructions (Addendum)
 Your provider has requested that you have an abdominal x ray before leaving today. Please go to the basement floor to our Radiology department for the test.  You have been scheduled for an appointment with Alan Coombs on 01-16-25 at 1040am . Please arrive 10 minutes early for your appointment.  Add on fiber Fiber Chart  You should be consuming 25-30g of fiber per day and drinking 8 glasses of water to help your bowels move regularly.  In the chart below you can look up how much fiber you are getting in an average day.  If you are not getting enough fiber, you should add a fiber supplement to your diet.  Examples of this include Metamucil, FiberCon and Citrucel.  These can be purchased at your local grocery store or pharmacy.      [email or web address]   Toileting tips to help with your constipation - Drink at least 64-80 ounces of water/liquid per day. - Establish a time to try to move your bowels every day.  For many people, this is after a cup of coffee or after a meal such as breakfast. - Sit all of the way back on the toilet keeping your back fairly straight and while sitting up, try to rest the tops of your forearms on your upper thighs.   - Raising your feet with a step stool/squatty potty can be helpful to improve the angle that allows your stool to pass through the rectum. - Relax the rectum feeling it bulge toward the toilet water.  If you feel your rectum raising toward your body, you are contracting rather than relaxing. - Breathe in and slowly exhale. Belly breath by expanding your belly towards your belly button. Keep belly expanded as you gently direct pressure down and back to the anus.  A low pitched GRRR sound can assist with increasing intra-abdominal pressure.  (Can also trying to blow on a pinwheel and make it move, this helps with the same belly breathing) - Repeat 3-4 times. If unsuccessful, contract the pelvic  floor to restore normal tone and get off the toilet.  Avoid excessive straining. - To reduce excessive wiping by teaching your anus to normally contract, place hands on outer aspect of knees and resist knee movement outward.  Hold 5-10 second then place hands just inside of knees and resist inward movement of knees.  Hold 5 seconds.  Repeat a few times each way.  Go to the ER if unable to pass gas, severe AB pain, unable to hold down food, any shortness of breath of chest pain.   Here some information about pelvic floor dysfunction. This may be contributing to some of your symptoms. We will continue with our evaluation but I do want you to consider adding on fiber supplement with low-dose MiraLAX daily. We could also refer to pelvic floor physical therapy.   Pelvic Floor Dysfunction, Female Pelvic floor dysfunction (PFD) is a condition that results when the group of muscles and connective tissues that support the organs in the pelvis (pelvic floor muscles) do not work well. These muscles and their connections form a sling that supports the colon and bladder. In women, they also support the uterus. PFD causes pelvic floor muscles to be too weak, too tight, or both. In PFD, muscle movements are not coordinated. This may cause bowel or bladder problems. It may also cause pain. What are the causes? This condition may be caused by an injury to the pelvic area or by a weakening of pelvic muscles.  This often results from pregnancy and childbirth or other types of strain. In many cases, the exact cause is not known. What increases the risk? The following factors may make you more likely to develop this condition: Having chronic bladder tissue inflammation (interstitial cystitis). Being an older person. Being overweight. History of radiation treatment for cancer in the pelvic region. Previous pelvic surgery, such as removal of the uterus (hysterectomy). What are the signs or symptoms? Symptoms of  this condition vary and may include: Bladder symptoms, such as: Trouble starting urination and emptying the bladder. Frequent urinary tract infections. Leaking urine when coughing, laughing, or exercising (stress incontinence). Having to pass urine urgently or frequently. Pain when passing urine. Bowel symptoms, such as: Constipation. Urgent or frequent bowel movements. Incomplete bowel movements. Painful bowel movements. Leaking stool or gas. Unexplained genital or rectal pain. Genital or rectal muscle spasms. Low back pain. Other symptoms may include: A heavy, full, or aching feeling in the vagina. A bulge that protrudes into the vagina. Pain during or after sex. How is this diagnosed? This condition may be diagnosed based on: Your symptoms and medical history. A physical exam. During the exam, your health care provider may check your pelvic muscles for tightness, spasm, pain, or weakness. This may include a rectal exam and a pelvic exam. In some cases, you may have diagnostic tests, such as: Electrical muscle function tests. Urine flow testing. X-ray tests of bowel function. Ultrasound of the pelvic organs. How is this treated? Treatment for this condition depends on the symptoms. Treatment options include: Physical therapy. This may include Kegel exercises to help relax or strengthen the pelvic floor muscles. Biofeedback. This type of therapy provides feedback on how tight your pelvic floor muscles are so that you can learn to control them. Internal or external massage therapy. A treatment that involves electrical stimulation of the pelvic floor muscles to help control pain (transcutaneous electrical nerve stimulation, or TENS). Sound wave therapy (ultrasound) to reduce muscle spasms. Medicines, such as: Muscle relaxants. Bladder control medicines. Surgery to reconstruct or support pelvic floor muscles may be an option if other treatments do not help. Follow these  instructions at home: Activity Do your usual activities as told by your health care provider. Ask your health care provider if you should modify any activities. Do pelvic floor strengthening or relaxing exercises at home as told by your physical therapist. Lifestyle Maintain a healthy weight. Eat foods that are high in fiber, such as beans, whole grains, and fresh fruits and vegetables. Limit foods that are high in fat and processed sugars, such as fried or sweet foods. Manage stress with relaxation techniques such as yoga or meditation. General instructions If you have problems with leakage: Use absorbable pads or wear padded underwear. Wash frequently with mild soap. Keep your genital and anal area as clean and dry as possible. Ask your health care provider if you should try a barrier cream to prevent skin irritation. Take warm baths to relieve pelvic muscle tension or spasms. Take over-the-counter and prescription medicines only as told by your health care provider. Keep all follow-up visits. How is this prevented? The cause of PFD is not always known, but there are a few things you can do to reduce the risk of developing this condition, including: Staying at a healthy weight. Getting regular exercise. Managing stress. Contact a health care provider if: Your symptoms are not improving with home care. You have signs or symptoms of PFD that get worse at home. You  develop new signs or symptoms. You have signs of a urinary tract infection, such as: Fever. Chills. Increased urinary frequency. A burning feeling when urinating. You have not had a bowel movement in 3 days (constipation). Summary Pelvic floor dysfunction results when the muscles and connective tissues in your pelvic floor do not work well. These muscles and their connections form a sling that supports your colon and bladder. In women, they also support the uterus. PFD may be caused by an injury to the pelvic area or by  a weakening of pelvic muscles. PFD causes pelvic floor muscles to be too weak, too tight, or a combination of both. Symptoms may vary from person to person. In most cases, PFD can be treated with physical therapies and medicines. Surgery may be an option if other treatments do not help. This information is not intended to replace advice given to you by your health care provider. Make sure you discuss any questions you have with your health care provider. Document Revised: 04/15/2021 Document Reviewed: 04/15/2021 Elsevier Patient Education  2022 [employer].

## 2024-12-24 ENCOUNTER — Ambulatory Visit: Payer: Self-pay | Admitting: Physician Assistant

## 2025-01-16 ENCOUNTER — Ambulatory Visit: Admitting: Physician Assistant

## 2025-02-06 ENCOUNTER — Ambulatory Visit: Admitting: Physician Assistant

## 2025-02-06 ENCOUNTER — Ambulatory Visit: Admitting: Physical Therapy

## 2025-02-26 ENCOUNTER — Inpatient Hospital Stay

## 2025-02-26 ENCOUNTER — Inpatient Hospital Stay: Admitting: Oncology
